# Patient Record
Sex: Female | Born: 1960 | Race: Black or African American | Hispanic: No | Marital: Single | Smoking: Never smoker
Health system: Southern US, Community
[De-identification: ages and names within clinical notes are randomized; demographics above are authoritative.]

## PROBLEM LIST (undated history)

## (undated) DIAGNOSIS — I10 Essential (primary) hypertension: Secondary | ICD-10-CM

## (undated) DIAGNOSIS — G4730 Sleep apnea, unspecified: Secondary | ICD-10-CM

## (undated) DIAGNOSIS — I872 Venous insufficiency (chronic) (peripheral): Secondary | ICD-10-CM

## (undated) HISTORY — DX: Essential (primary) hypertension: I10

## (undated) HISTORY — DX: Venous insufficiency (chronic) (peripheral): I87.2

## (undated) HISTORY — PX: OTHER SURGICAL HISTORY: SHX169

## (undated) HISTORY — DX: Sleep apnea, unspecified: G47.30

---

## 2001-05-21 ENCOUNTER — Encounter: Admission: RE | Admit: 2001-05-21 | Discharge: 2001-05-21 | Payer: Self-pay | Admitting: Family Medicine

## 2001-07-18 ENCOUNTER — Encounter: Admission: RE | Admit: 2001-07-18 | Discharge: 2001-07-18 | Payer: Self-pay | Admitting: Family Medicine

## 2001-09-18 ENCOUNTER — Encounter: Admission: RE | Admit: 2001-09-18 | Discharge: 2001-09-18 | Payer: Self-pay | Admitting: Family Medicine

## 2002-04-23 ENCOUNTER — Encounter: Payer: Self-pay | Admitting: Sports Medicine

## 2002-04-23 ENCOUNTER — Encounter: Admission: RE | Admit: 2002-04-23 | Discharge: 2002-04-23 | Payer: Self-pay | Admitting: Sports Medicine

## 2002-04-23 ENCOUNTER — Encounter: Admission: RE | Admit: 2002-04-23 | Discharge: 2002-04-23 | Payer: Self-pay | Admitting: Family Medicine

## 2002-05-07 ENCOUNTER — Encounter: Admission: RE | Admit: 2002-05-07 | Discharge: 2002-05-07 | Payer: Self-pay | Admitting: Family Medicine

## 2003-06-06 ENCOUNTER — Encounter (INDEPENDENT_AMBULATORY_CARE_PROVIDER_SITE_OTHER): Payer: Self-pay | Admitting: *Deleted

## 2003-06-06 LAB — CONVERTED CEMR LAB

## 2003-06-07 ENCOUNTER — Encounter: Admission: RE | Admit: 2003-06-07 | Discharge: 2003-06-07 | Payer: Self-pay | Admitting: Family Medicine

## 2003-06-11 ENCOUNTER — Encounter: Admission: RE | Admit: 2003-06-11 | Discharge: 2003-06-11 | Payer: Self-pay | Admitting: Family Medicine

## 2005-02-08 ENCOUNTER — Ambulatory Visit: Payer: Self-pay | Admitting: Family Medicine

## 2005-02-28 ENCOUNTER — Ambulatory Visit: Payer: Self-pay | Admitting: Family Medicine

## 2005-03-02 ENCOUNTER — Encounter: Admission: RE | Admit: 2005-03-02 | Discharge: 2005-03-02 | Payer: Self-pay | Admitting: Sports Medicine

## 2005-03-02 IMAGING — MG MM MAMMO SCREENING
4 series · 4 of 4 positions shown · non-contrast
Comparison: none

SCREENING MAMMOGRAM:
There is a fibroglandular pattern.  No masses or malignant type calcifications are identified.  
Compared with prior studies.

[R CC]
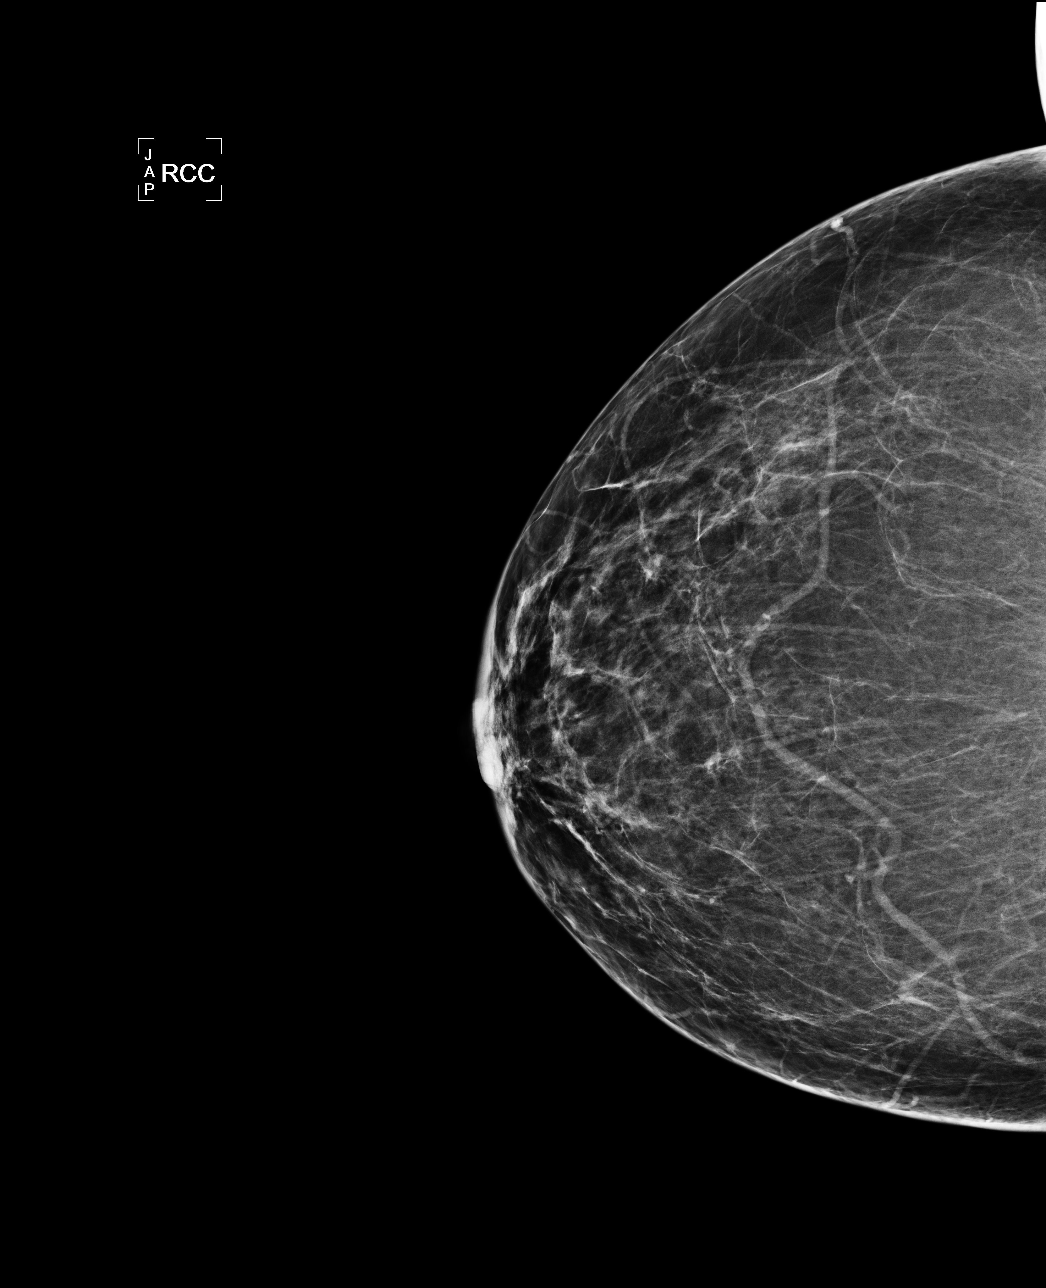

[L CC]
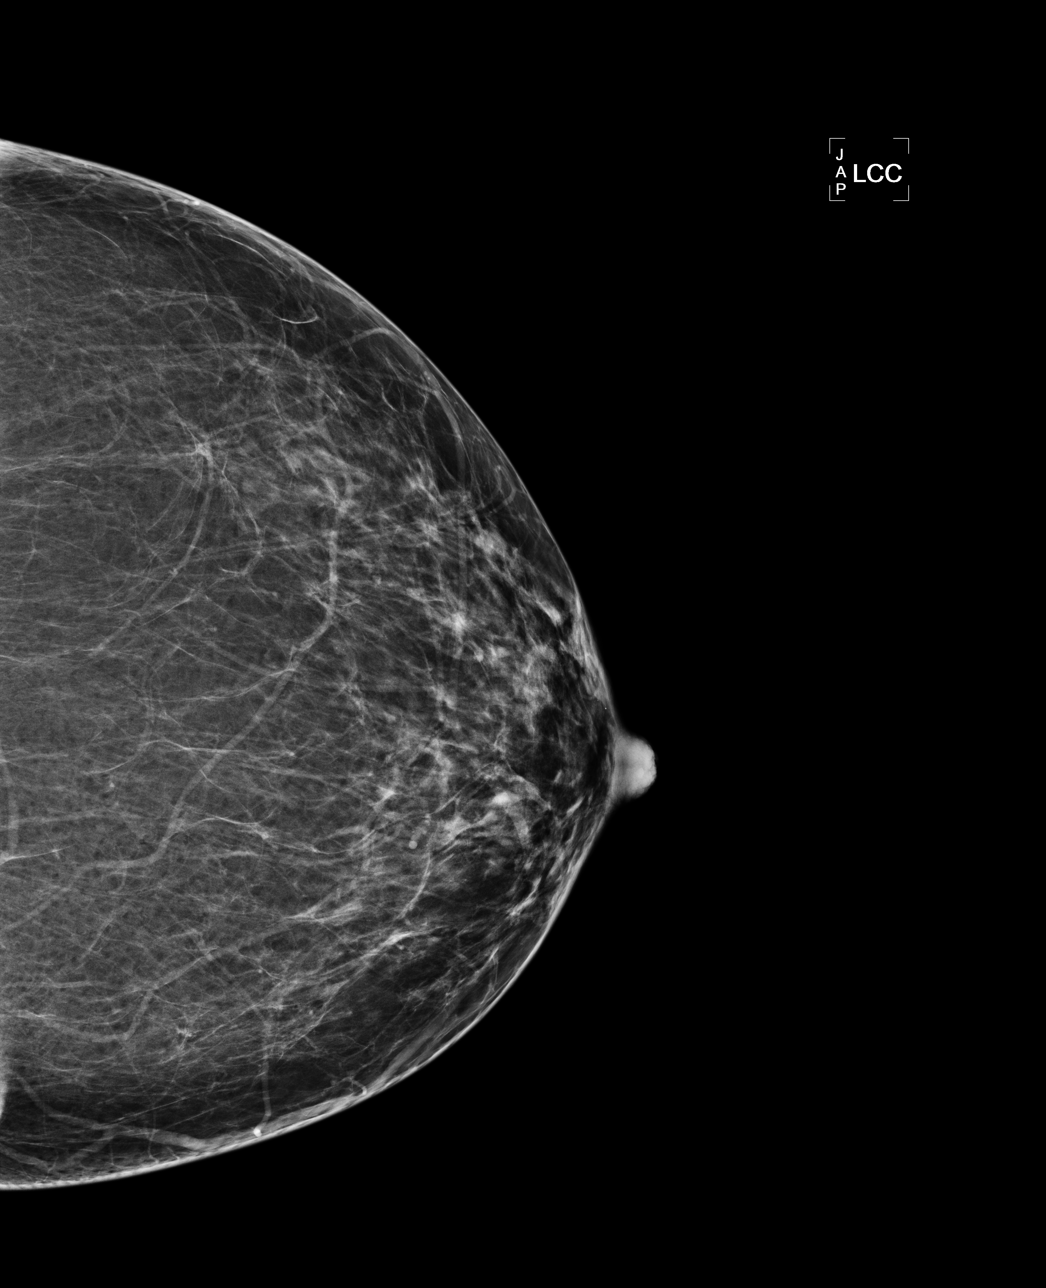

[L MLO]
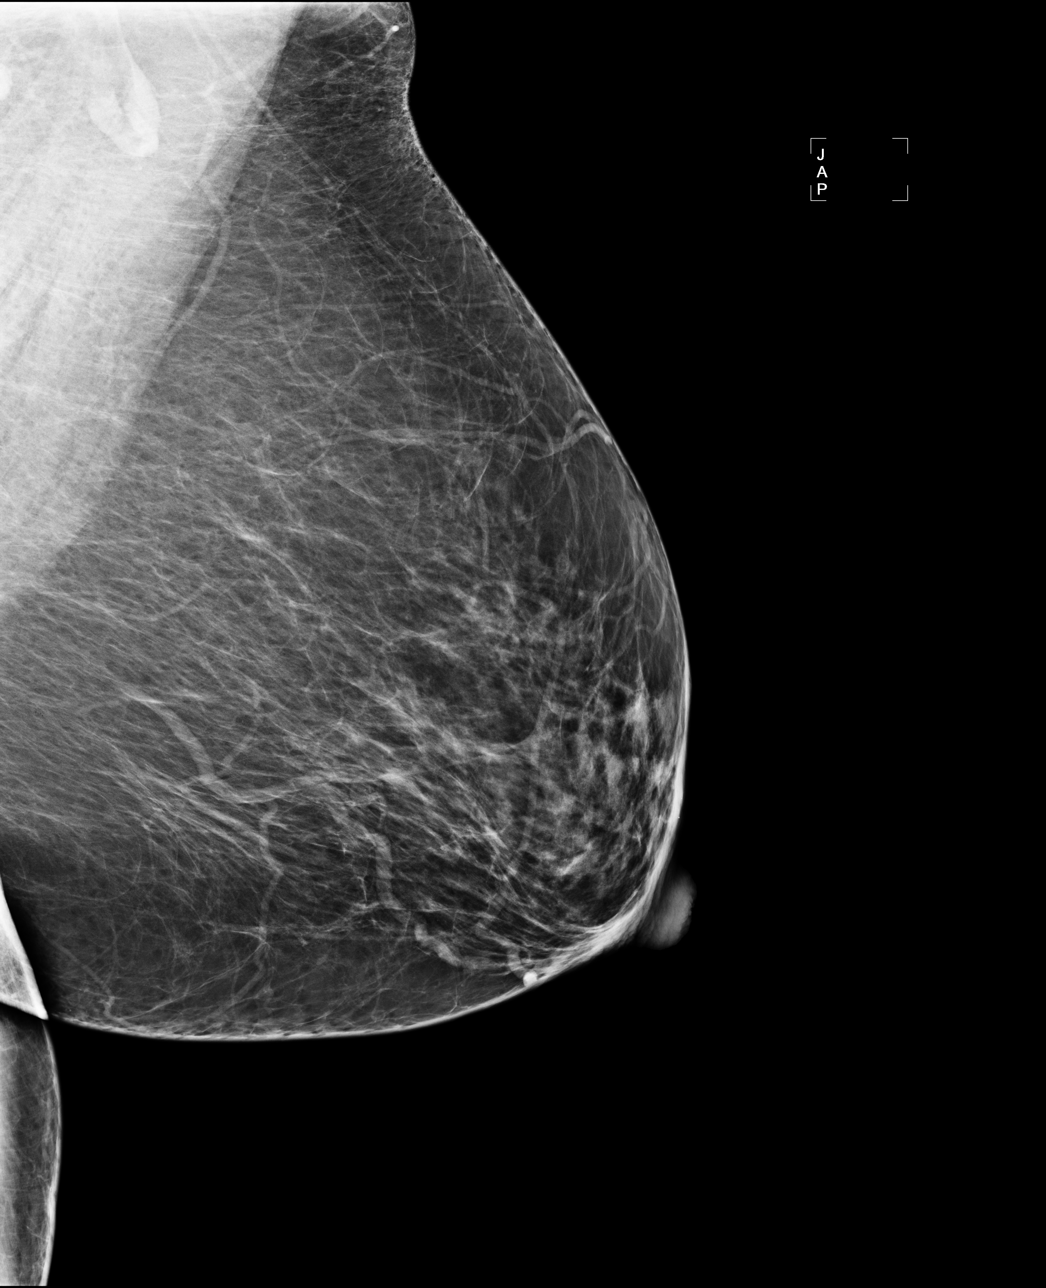

[R MLO]
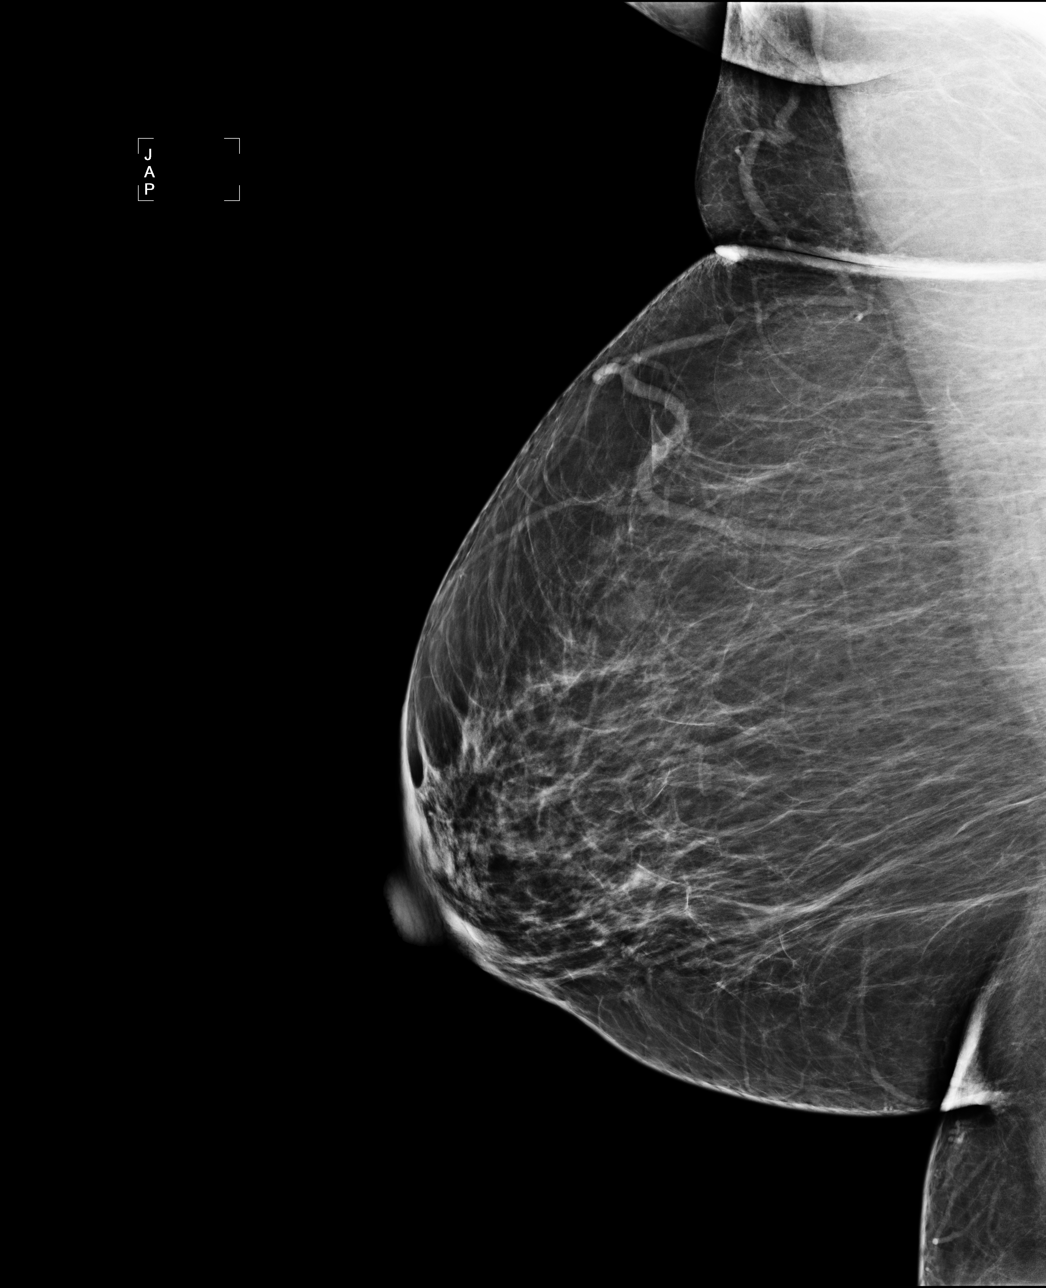

[4 of 4 positions shown; findings below may reference images not displayed]

IMPRESSION: No specific mammographic evidence of malignancy.  Next screening mammogram is recommended in one 
year.

ASSESSMENT: Negative - BI-RADS 1

Screening mammogram in 1 year.

## 2006-03-28 ENCOUNTER — Ambulatory Visit: Payer: Self-pay | Admitting: Family Medicine

## 2006-04-05 ENCOUNTER — Encounter (INDEPENDENT_AMBULATORY_CARE_PROVIDER_SITE_OTHER): Payer: Self-pay | Admitting: *Deleted

## 2008-05-12 ENCOUNTER — Telehealth: Payer: Self-pay | Admitting: Family Medicine

## 2008-05-14 ENCOUNTER — Ambulatory Visit: Payer: Self-pay | Admitting: Family Medicine

## 2008-05-14 DIAGNOSIS — I872 Venous insufficiency (chronic) (peripheral): Secondary | ICD-10-CM | POA: Insufficient documentation

## 2008-06-15 ENCOUNTER — Encounter: Payer: Self-pay | Admitting: Family Medicine

## 2008-06-15 ENCOUNTER — Ambulatory Visit: Payer: Self-pay | Admitting: Family Medicine

## 2008-06-15 DIAGNOSIS — I10 Essential (primary) hypertension: Secondary | ICD-10-CM | POA: Insufficient documentation

## 2008-06-15 LAB — CONVERTED CEMR LAB
ALT: 27 units/L (ref 0–35)
AST: 34 units/L (ref 0–37)
Albumin: 4.5 g/dL (ref 3.5–5.2)
Alkaline Phosphatase: 53 units/L (ref 39–117)
BUN: 9 mg/dL (ref 6–23)
CO2: 23 meq/L (ref 19–32)
Calcium: 9.7 mg/dL (ref 8.4–10.5)
Chloride: 104 meq/L (ref 96–112)
Creatinine, Ser: 0.86 mg/dL (ref 0.40–1.20)
Glucose, Bld: 90 mg/dL (ref 70–99)
HCT: 40.9 % (ref 36.0–46.0)
Hemoglobin: 14 g/dL (ref 12.0–15.0)
MCHC: 34.2 g/dL (ref 30.0–36.0)
MCV: 94.2 fL (ref 78.0–100.0)
Platelets: 219 10*3/uL (ref 150–400)
Potassium: 3.7 meq/L (ref 3.5–5.3)
RBC: 4.34 M/uL (ref 3.87–5.11)
RDW: 13 % (ref 11.5–15.5)
Sodium: 142 meq/L (ref 135–145)
TSH: 1.246 microintl units/mL (ref 0.350–4.500)
Total Bilirubin: 1 mg/dL (ref 0.3–1.2)
Total Protein: 7.5 g/dL (ref 6.0–8.3)
WBC: 4.7 10*3/uL (ref 4.0–10.5)

## 2008-06-19 ENCOUNTER — Encounter: Payer: Self-pay | Admitting: Family Medicine

## 2008-11-29 ENCOUNTER — Ambulatory Visit: Payer: Self-pay | Admitting: Family Medicine

## 2008-11-29 DIAGNOSIS — E663 Overweight: Secondary | ICD-10-CM | POA: Insufficient documentation

## 2009-01-06 ENCOUNTER — Ambulatory Visit: Payer: Self-pay | Admitting: Family Medicine

## 2009-01-06 ENCOUNTER — Encounter: Payer: Self-pay | Admitting: Family Medicine

## 2009-01-06 LAB — CONVERTED CEMR LAB
ALT: 17 units/L (ref 0–35)
AST: 27 units/L (ref 0–37)
Albumin: 4.2 g/dL (ref 3.5–5.2)
Alkaline Phosphatase: 43 units/L (ref 39–117)
BUN: 9 mg/dL (ref 6–23)
CO2: 24 meq/L (ref 19–32)
Calcium: 9 mg/dL (ref 8.4–10.5)
Chloride: 106 meq/L (ref 96–112)
Cholesterol: 143 mg/dL (ref 0–200)
Creatinine, Ser: 0.75 mg/dL (ref 0.40–1.20)
Glucose, Bld: 93 mg/dL (ref 70–99)
HDL: 54 mg/dL (ref >39)
LDL Cholesterol: 27 mg/dL (ref 0–99)
Potassium: 3.7 meq/L (ref 3.5–5.3)
Sodium: 143 meq/L (ref 135–145)
Total Bilirubin: 0.4 mg/dL (ref 0.3–1.2)
Total CHOL/HDL Ratio: 2.6
Total Protein: 6.5 g/dL (ref 6.0–8.3)
Triglycerides: 308 mg/dL — ABNORMAL HIGH (ref <150)
VLDL: 62 mg/dL — ABNORMAL HIGH (ref 0–40)

## 2010-01-18 ENCOUNTER — Encounter: Payer: Self-pay | Admitting: Family Medicine

## 2010-01-18 ENCOUNTER — Ambulatory Visit: Payer: Self-pay | Admitting: Family Medicine

## 2010-01-18 LAB — CONVERTED CEMR LAB
BUN: 8 mg/dL (ref 6–23)
CO2: 24 meq/L (ref 19–32)
Calcium: 9.5 mg/dL (ref 8.4–10.5)
Chloride: 103 meq/L (ref 96–112)
Creatinine, Ser: 0.79 mg/dL (ref 0.40–1.20)
Glucose, Bld: 109 mg/dL — ABNORMAL HIGH (ref 70–99)
Potassium: 3.5 meq/L (ref 3.5–5.3)
Sodium: 142 meq/L (ref 135–145)

## 2010-02-07 ENCOUNTER — Ambulatory Visit: Admission: RE | Admit: 2010-02-07 | Payer: Self-pay | Source: Home / Self Care

## 2010-02-07 ENCOUNTER — Encounter: Payer: Self-pay | Admitting: Family Medicine

## 2010-02-10 ENCOUNTER — Ambulatory Visit: Admission: RE | Admit: 2010-02-10 | Discharge: 2010-02-10 | Payer: Self-pay | Source: Home / Self Care

## 2010-03-01 ENCOUNTER — Encounter: Payer: Self-pay | Admitting: Family Medicine

## 2010-03-01 ENCOUNTER — Ambulatory Visit: Admission: RE | Admit: 2010-03-01 | Discharge: 2010-03-01 | Payer: Self-pay | Source: Home / Self Care

## 2010-03-02 ENCOUNTER — Encounter: Payer: Self-pay | Admitting: Family Medicine

## 2010-03-02 LAB — CONVERTED CEMR LAB
BUN: 11 mg/dL
CO2: 28 meq/L
Calcium: 9.7 mg/dL
Chloride: 96 meq/L
Creatinine, Ser: 0.69 mg/dL
Glucose, Bld: 96 mg/dL
Potassium: 2.9 meq/L — ABNORMAL LOW
Sodium: 139 meq/L

## 2010-03-06 ENCOUNTER — Telehealth: Payer: Self-pay | Admitting: Family Medicine

## 2010-03-06 DIAGNOSIS — E876 Hypokalemia: Secondary | ICD-10-CM | POA: Insufficient documentation

## 2010-03-09 NOTE — Miscellaneous (Signed)
Summary: late entry  triage   Clinical Lists Changes  patient came to office yesterday afternoon stating while at work she felt nauseated and vomited 4 times.  someone took her BP and told her it was very high. she comes in now for BP check. states she has been without her meds for at least 2 months. BP checked manually using regular adult cuff.  BP LA 130/90  RA 130/88.  pulse 92. no further vomiting but she does feel nauseated.  advised to start sipping on clear liquids slowly this evening then gradually push fluids. bland diet, appointment scheduled for 01/18/2010 with Dr. Janalyn Harder for follow up and refill meds. Theresia Lo RN  January 18, 2010 9:05 AM

## 2010-03-09 NOTE — Letter (Signed)
Summary: Generic Letter  [facility]  [address]   [place], [place] [address]   Phone: [phone]  Fax: [phone]    03/02/2010  [facility] [address] [place], [place]  [address]  Dear Ms. Cohron,  I tried calling you regarding your lab result but your phone was disconnected.  Your lab showed a  low potassium (2.9).   This is likely due to the increased dose of Hydrochlorothiazide that you are taking and the Furosemide.  I have sent a prescription for KCl daily x 1 week to your pharmacy.  You will take this medicine every day for one week.  Then we will repeat lab.  We may need to make changes to your medication if potassium continues to be low.  We may stop the  Lasix and replace it  with spironolactone.  Please make an appointment to see me in the clinic in one week.  Please call if you have any questions or concerns.    Sincerely,   Rory Xiang MD  Appended Document: Generic Letter mailed

## 2010-03-09 NOTE — Assessment & Plan Note (Signed)
Summary: F/U  BP/KH   Vital Signs:  Patient profile:   50 year old female Height:      65.5 inches Weight:      155 pounds BMI:     25.49 Temp:     98.1 degrees F Pulse rate:   96 / minute BP sitting:   120 / 83  (left arm) Cuff size:   regular  Vitals Entered By: Golden Circle RN (March 01, 2010 9:02 AM) CC: f/u bp & check lesions R leg Pain Assessment Patient in pain? no       Does patient need assistance? Functional Status Self care Ambulation Normal   Primary Care Provider:  Moira Umholtz MD  CC:  f/u bp & check lesions R leg.  History of Present Illness: 50 y/o F here for HTN f/u as we increased HCTZ from 12.5 mg to 25mg  at last visit.   HYPERTENSION Disease Monitoring Blood pressure range: 140s/90s Medications: hctz 25mg , lasix 20  Compliance: yes, states she takes all meds daily.  Compliance prob in the past, but states that she has not missed any doses  Lightheadedness: no  Edema:no  Chest pain: no  Dyspnea:no  Syncope:no  Palpitations: no Prevention Exercise: yes, walking at least 3-4 times per week, for 30 minutes each time   Salt restriction:trying Smoking: quit  Weight: -3.6 lbs   Habits & Providers  Alcohol-Tobacco-Diet     Alcohol drinks/day: <1     Alcohol type: beer     Tobacco Status: never  Exercise-Depression-Behavior     Does Patient Exercise: yes     Type of exercise: walk     Times/week: 6     Drug Use: never     Seat Belt Use: always  Current Medications (verified): 1)  Furosemide 20 Mg Tabs (Furosemide) .Marland Kitchen.. 1 Tab By Mouth Daily 2)  Hydrochlorothiazide 25 Mg Tabs (Hydrochlorothiazide) .Marland Kitchen.. 1 Tab By Mouth Every Day For Blood Pressure 3)  Aspir-Low 81 Mg Tbec (Aspirin) .Marland Kitchen.. 1 Tab By Mouth Daiy  Allergies (verified): No Known Drug Allergies  Social History: Does Patient Exercise:  yes Drug Use:  never Seat Belt Use:  always  Review of Systems       per hpi   Physical Exam  General:  Well-developed,well-nourished,in no  acute distress; alert,appropriate and cooperative throughout examination. Vitals reviewed.    Impression & Recommendations:  Problem # 1:  ESSENTIAL HYPERTENSION, BENIGN (ICD-401.1) Assessment Improved BP 120/83 is improved and at goal.  Will continue HCTZ 25mg , Lasix 20mg .  Will check Bmet today.  Pt to rtc in 2 months for f/u.     Her updated medication list for this problem includes:    Furosemide 20 Mg Tabs (Furosemide) .Marland Kitchen... 1 tab by mouth daily    Hydrochlorothiazide 25 Mg Tabs (Hydrochlorothiazide) .Marland Kitchen... 1 tab by mouth every day for blood pressure  Orders: Basic Met-[facility] ([address]20-25427) [facility]- Est Level  3 ([address]237)  Complete Medication List: 1)  Furosemide 20 Mg Tabs (Furosemide) .Marland Kitchen.. 1 tab by mouth daily 2)  Hydrochlorothiazide 25 Mg Tabs (Hydrochlorothiazide) .Marland Kitchen.. 1 tab by mouth every day for blood pressure 3)  Aspir-low 81 Mg Tbec (Aspirin) .Marland Kitchen.. 1 tab by mouth daiy 4)  Hydrocortisone 0.5 % Oint (Hydrocortisone) .... Apply two times a day to affected areas on legs. dispense 30 grams.  Other Orders: Mammogram (Screening) (Mammo)  Patient Instructions: 1)  Please schedule a follow-up appointment in 2 months for HTN.  2)  Your blood pressure looks great today.  3)  Continue all your medications as directed. 4)  Call your pharmacy for refills when you are done with your medications.  5)  Continue to exercise.  6)  Schedule your mammogram.  Prescriptions: HYDROCORTISONE 0.5 % OINT (HYDROCORTISONE) Apply two times a day to affected areas on legs. Dispense 30 grams.  #1 x 3   Entered and Authorized by:   Angeline Slim MD   Signed by:   Angeline Slim MD on 03/01/2010   Method used:   [facility] [address] [phone]* (retail)       [address]       [place], [place]  [address]       Ph: [phone] or [phone]       Fax: [phone]   RxID:   [id number]    Orders Added: 1)  Mammogram (Screening) [Mammo] 2)  Basic Met-[facility]  [36644-03474] 3)  [facility]- Est Level  3 [25956]     Prevention & Chronic Care Immunizations   Influenza vaccine: Fluvax 3+  (01/18/2010)   Influenza vaccine due: 11/29/2009    Tetanus booster: 06/06/2003: Done.   Tetanus booster due: 06/05/2013    Pneumococcal vaccine: Not documented  Other Screening   Pap smear: NEGATIVE FOR INTRAEPITHELIAL LESIONS OR MALIGNANCY.  (06/15/2008)   Pap smear due: 06/15/2009    Mammogram: Done.  (03/14/2005)   Mammogram action/deferral: Ordered  (03/01/2010)   Mammogram due: 03/14/2006   Smoking status: never  (03/01/2010)  Lipids   Total Cholesterol: 143  (01/06/2009)   LDL: 27  (01/06/2009)   LDL Direct: Not documented   HDL: 54  (01/06/2009)   Triglycerides: 308  (01/06/2009)  Hypertension   Last Blood Pressure: 120 / 83  (03/01/2010)   Serum creatinine: 0.79  (01/18/2010)   Serum potassium 3.5  (01/18/2010)    Hypertension flowsheet reviewed?: Yes   Progress toward BP goal: At goal  Self-Management Support :   Personal Goals (by the next clinic visit) :      Personal blood pressure goal: 130/80  (11/29/2008)   Hypertension self-management support: Written self-care plan, Resources for patients handout  (01/06/2009)   Nursing Instructions: Schedule screening mammogram (see order)

## 2010-03-09 NOTE — Assessment & Plan Note (Signed)
Summary: out of BP meds   Vital Signs:  Patient profile:   50 year old female Height:      65.5 inches Weight:      164.06 pounds BMI:     26.98 Temp:     98.2 degrees F oral Pulse rate:   85 / minute BP sitting:   146 / 102  (right arm)  Vitals Entered By: Angeline Slim MD (January 18, 2010 9:49 AM) CC: Out of BP medication Is Patient Diabetic? No Pain Assessment Patient in pain? no        Primary Care Provider:  Cat Ta MD  CC:  Out of BP medication.  History of Present Illness: 50 y/o F with HTN is here for elevated BP.    Pt was at work yesterday when she became nauseous.  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  The nurse checked her BP and it was very high and so she came here yesterday for BP check and it was in the 130s/90s.  Pt has been out of BP meds x at least 6 months.    HYPERTENSION Disease Monitoring Blood pressure range: 130s-160s Medications: Lasix 20mg  daily, HCTZ 12.5mg  daily Compliance: NO, has been off of meds for at least 6 months. Lightheadedness:no   Edema:no  Chest pain:no  Dyspnea:no   Prevention Exercise: walking everyday about 1hr    Salt restriction:trying, states "it's very hard" Tobacco: none Etoh: 1-2 beers each day Drugs: none Weight: -2 lbs since last visit.    Habits & Providers  Alcohol-Tobacco-Diet     Tobacco Status: never  Current Medications (verified): 1)  None  Allergies (verified): No Known Drug Allergies  Past History:  Past Surgical History: Last updated: 04/04/2006 BTL - 02/06/1996, C-section - 02/06/1996  Family History: Last updated: 06/15/2008 1 sister--recurrent sinus infections, allergies,  Father died in his late 23`s, unknown cause,  Mom died with Type 1 DM, age 19, HTN No breast CA, gyn CA  Social History: Last updated: 01/18/2010 Lives with 3 daughters in [place] apt.; No tobacco or illicit drug history; Drinks 1-2 beers daily  Employment: [employer] @ [employer] since   1997;  Sporadic exercise (walking)  Risk Factors: Smoking Status: never (01/18/2010)  Past Medical History: H/o trichomonas (6/03)--treated, NSVD x 2, Varicose veins bilateral legs [id number]: first delivery was C/S, rest were VBAC HTN Noncompliance  Social History: Lives with 3 daughters in [place] apt.; No tobacco or illicit drug history; Drinks 1-2 beers daily  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 Vitals reviewed.  Lungs:  Normal respiratory effort, chest expands symmetrically. Lungs are clear to auscultation, no crackles or wheezes. Heart:  Normal rate and regular rhythm. S1 and S2 normal without gallop, murmur, click, rub or other extra sounds. Pulses:  +2 bilaterally  Extremities:  trace edema b/l    Impression & Recommendations:  Problem # 1:  ESSENTIAL HYPERTENSION, BENIGN (ICD-401.1) Assessment Deteriorated Pt has been out of meds for months.  She has been noncompliant in the past.  I am not sure the reason behind this.  Pt denies that cost is an issue.  Pt states, "I have insurance and the medicines are not expensive."  I will resume previous meds of Lasix 20mg  and HCTZ  12.5mg  as they worked to get her to goal of <130/80 when she took the meds.  I am resuming Lasix as pt has elevated DBP.  Given her noncompliance with HTN I am concerned for future LVH.  Will check CBC and Bmet today.  Pt to rtc in 2 wks for f/u.    Her updated medication list for this problem includes:    Furosemide 20 Mg Tabs (Furosemide) .Marland Kitchen... 1 tab by mouth daily    Hydrochlorothiazide 12.5 Mg Caps (Hydrochlorothiazide) ..... One tablet by mouth daily  Orders: Basic Met-[facility] ([address]130-86578) [facility]- Est Level  3 (46962)  Complete Medication List: 1)  Furosemide 20 Mg Tabs  (Furosemide) .Marland Kitchen.. 1 tab by mouth daily 2)  Hydrochlorothiazide 12.5 Mg Caps (Hydrochlorothiazide) .... One tablet by mouth daily  Other Orders: Flu Vaccine 55yrs + ([address]284) Admin 1st Vaccine ([address]244)  Patient Instructions: 1)  Please schedule a follow-up appointment in 2 weeks for blood pressure check. 2)  Take your blood pressure medicine everyday: 3)  HCTZ 12.5mg  4)  Furosemide 20mg   Prescriptions: FUROSEMIDE 20 MG TABS (FUROSEMIDE) 1 tab by mouth daily  #30 x 0   Entered and Authorized by:   Angeline Slim MD   Signed by:   Angeline Slim MD on 01/18/2010   Method used:   Electronically to        [facility] (retail)       [address]       [place], [place]  [id number]       Ph: [phone]       Fax: [phone]   RxID:   [id number] HYDROCHLOROTHIAZIDE 12.5 MG CAPS (HYDROCHLOROTHIAZIDE) one tablet by mouth daily  #30 x 0   Entered and Authorized by:   Angeline Slim MD   Signed by:   Angeline Slim MD on 01/18/2010   Method used:   Electronically to        [facility] (retail)       [address]       [place], [place]  [address]       Ph: [phone]       Fax: [phone]   RxID:   [id number] HYDROCHLOROTHIAZIDE 12.5 MG CAPS (HYDROCHLOROTHIAZIDE) one tablet by mouth daily  #30 x 0   Entered and Authorized by:   Angeline Slim MD   Signed by:   Angeline Slim MD on 01/18/2010   Method used:   Electronically to        [facility] (retail)       [address]       [place], [place]  [id number]       Ph: [phone]       Fax: [phone]   RxID:   [id number]    Orders Added: 1)  Basic Met-[facility] [78242-[address]361] 2)  [facility]- Est Level  3 [[address]315] 3)  Flu Vaccine 32yrs + [40086] 4)  Admin 1st Vaccine [76195]   Immunizations Administered:  Influenza Vaccine # 1:    Vaccine Type: Fluvax 3+    Site: right deltoid    Mfr: GlaxoSmithKline    Dose: 0.5 ml    Route: IM    Given by: Tessie Fass CMA    Exp. Date: 08/05/2010     Lot #: [id number]    VIS given: 08/30/09 version given January 18, 2010.  Flu Vaccine Consent Questions:    Do you have a history of severe allergic reactions to this vaccine?  no    Any prior history of allergic reactions to egg and/or gelatin? no    Do you have a sensitivity to the preservative Thimersol? no    Do you have a past history of Guillan-Barre Syndrome? no    Do you currently have an acute febrile illness? no    Have you ever had a severe reaction to latex? no    Vaccine information given and explained to patient? yes    Are you currently pregnant? no   Immunizations Administered:  Influenza Vaccine # 1:    Vaccine Type: Fluvax 3+    Site: right deltoid    Mfr: GlaxoSmithKline    Dose: 0.5 ml    Route: IM    Given by: Tessie Fass CMA    Exp. Date: 08/05/2010    Lot #: [id number]    VIS given: 08/30/09 version given January 18, 2010.    Prevention & Chronic Care Immunizations   Influenza vaccine: Fluvax 3+  (01/18/2010)   Influenza vaccine due: 11/29/2009    Tetanus booster: 06/06/2003: Done.   Tetanus booster due: 06/05/2013    Pneumococcal vaccine: Not documented  Other Screening   Pap smear: NEGATIVE FOR INTRAEPITHELIAL LESIONS OR MALIGNANCY.  (06/15/2008)   Pap smear due: 06/15/2009    Mammogram: Done.  (03/14/2005)   Mammogram action/deferral: Ordered  (11/29/2008)   Mammogram due: 03/14/2006   Smoking status: never  (01/18/2010)  Lipids   Total Cholesterol: 143  (01/06/2009)   LDL: 27  (01/06/2009)   LDL Direct: Not documented   HDL: 54  (01/06/2009)   Triglycerides: 308  (01/06/2009)  Hypertension   Last Blood Pressure: 146 / 102  (01/18/2010)   Serum creatinine: 0.75  (01/06/2009)   Serum potassium 3.7  (01/06/2009)    Hypertension flowsheet reviewed?: Yes   Progress toward BP goal: Deteriorated  Self-Management Support :   Personal Goals (by the next clinic visit) :      Personal blood pressure goal: 130/80  (11/29/2008)    Hypertension self-management support: Written self-care plan, Resources for patients handout  (01/06/2009)

## 2010-03-09 NOTE — Letter (Signed)
Summary: Out of Work  [facility]  [address]   [place], [place] [address]   Phone: [phone]  Fax: [phone]    January 18, 2010   Employee:  Kiesha D Topping    To Whom It May Concern:   For Medical reasons, please excuse the above named employee from work for the following dates:  Start:   Jan 18, 2010  End:   May return to work on Jan 19, 2010  If you need additional information, please feel free to contact our office.         Sincerely,    Cat Ta MD

## 2010-03-09 NOTE — Assessment & Plan Note (Signed)
Summary: F/U  HTN   Vital Signs:  Patient profile:   50 year old female Weight:      158.6 pounds Pulse rate:   90 / minute BP sitting:   130 / 90  (right arm) Cuff size:   regular  Vitals Entered By: Arlyss Repress CMA, (February 10, 2010 9:00 AM) CC: f/up HTN Is Patient Diabetic? No Pain Assessment Patient in pain? no        Primary Care Provider:  Keyvon Herter MD  CC:  f/up HTN.  History of Present Illness: 50 y/o F here for HTN f/u  HYPERTENSION Disease Monitoring Blood pressure range:  Medications: hctz 12.5, lasix 20  Compliance: yes, states she takes all meds daily.  Compliance prob in the pas Lightheadedness: no  Edema:no  Chest pain: no  Dyspnea:no   Prevention Exercise: no  Salt restriction:trying Smoking: quit    Habits & Providers  Alcohol-Tobacco-Diet     Tobacco Status: never  Current Medications (verified): 1)  Furosemide 20 Mg Tabs (Furosemide) .Marland Kitchen.. 1 Tab By Mouth Daily 2)  Hydrochlorothiazide 12.5 Mg Caps (Hydrochlorothiazide) .... One Tablet By Mouth Daily  Allergies (verified): No Known Drug Allergies  Review of Systems       per hpi   Physical Exam  General:  Well-developed,well-nourished,in no acute distress; alert,appropriate and cooperative throughout examination. vitals reviewed Lungs:  Normal respiratory effort, chest expands symmetrically. Lungs are clear to auscultation, no crackles or wheezes. Heart:  Normal rate and regular rhythm. S1 and S2 normal without gallop, murmur, click, rub or other extra sounds. Pulses:  +2 publes b/l  Extremities:  no edema   Impression & Recommendations:  Problem # 1:  ESSENTIAL HYPERTENSION, BENIGN (ICD-401.1) Assessment Improved  BP today 130/90, improved from last visit.  DBP still elevated.  Will change HCTZ 12.5mg  to 25mg  daily.  Cont Lasix 20mg  daily.  Pt to rtc in 2 wks for f/u.  Her updated medication list for this problem includes:    Furosemide 20 Mg Tabs (Furosemide) .Marland Kitchen... 1 tab by  mouth daily    Hydrochlorothiazide 25 Mg Tabs (Hydrochlorothiazide) .Marland Kitchen... 1 tab by mouth every day for blood pressure  Orders: [facility]- Est Level  3 ([address])  Complete Medication List: 1)  Furosemide 20 Mg Tabs (Furosemide) .Marland Kitchen.. 1 tab by mouth daily 2)  Hydrochlorothiazide 25 Mg Tabs (Hydrochlorothiazide) .Marland Kitchen.. 1 tab by mouth every day for blood pressure 3)  Aspir-low 81 Mg Tbec (Aspirin) .Marland Kitchen.. 1 tab by mouth daiy  Patient Instructions: 1)  Please schedule a follow-up appointment in 2 weeks for blood pressure. 2)  Hydrochlorothiazide 12.5mg : take 2 pills every day. 3)  Furosemide 20mg : take 1 pill every day 4)  Aspirin 81mg : take 1 every day 5)    Prescriptions: HYDROCHLOROTHIAZIDE 25 MG TABS (HYDROCHLOROTHIAZIDE) 1 tab by mouth every day for blood pressure  #30 x 1   Entered and Authorized by:   Angeline Slim MD   Signed by:   Angeline Slim MD on 02/10/2010   Method used:   [facility] [address] [phone]* (retail)       [address]       [place], [place]  [address]       Ph: [phone] or [phone]       Fax: [phone]   RxID:   [id number]    Orders Added: 1)  [facility]- Est Level  3 [  [address]] 

## 2010-03-09 NOTE — Assessment & Plan Note (Signed)
Summary: pt arrived 1 hr late/r/s.TS   Allergies: No Known Drug Allergies   Complete Medication List: 1)  Furosemide 20 Mg Tabs (Furosemide) .Marland Kitchen.. 1 tab by mouth daily 2)  Hydrochlorothiazide 12.5 Mg Caps (Hydrochlorothiazide) .... One tablet by mouth daily  Other Orders: No Charge Patient Arrived (NCPA0) (NCPA0)   Orders Added: 1)  No Charge Patient Arrived (NCPA0) [NCPA0]

## 2010-03-10 ENCOUNTER — Encounter: Payer: Self-pay | Admitting: Family Medicine

## 2010-03-10 ENCOUNTER — Other Ambulatory Visit (INDEPENDENT_AMBULATORY_CARE_PROVIDER_SITE_OTHER): Payer: BC Managed Care – PPO

## 2010-03-10 ENCOUNTER — Telehealth: Payer: Self-pay | Admitting: Family Medicine

## 2010-03-10 DIAGNOSIS — E876 Hypokalemia: Secondary | ICD-10-CM

## 2010-03-13 LAB — CONVERTED CEMR LAB
CO2: 31 meq/L (ref 19–32)
Calcium: 9.7 mg/dL (ref 8.4–10.5)
Chloride: 90 meq/L — ABNORMAL LOW (ref 96–112)
Creatinine, Ser: 0.54 mg/dL (ref 0.40–1.20)
Glucose, Bld: 101 mg/dL — ABNORMAL HIGH (ref 70–99)
Potassium: 2.5 meq/L — CL (ref 3.5–5.3)
Sodium: 135 meq/L (ref 135–145)

## 2010-03-15 NOTE — Progress Notes (Signed)
Summary: Appt?  Phone Note Call from Patient Call back at Home Phone [phone]   Summary of Call: pt received letter stating MD wants her to be seen in 1 week, there are no opening in one week, please advise Initial call taken by: Knox Royalty,  March 06, 2010 10:45 AM  Follow-up for Phone Call        Pt can come in for lab, so I can check her potassium, then schedule as soon as appt is possible.  Please inform pt of this.  Thank you.  Follow-up by: Angeline Slim MD,  March 06, 2010 4:18 PM  Additional Follow-up for Phone Call Additional follow up Details #1::        scheduled pt for labs on 2/3 & to see MD on 2/21.  Additional Follow-up by: Knox Royalty,  March 07, 2010 11:30 AM  New Problems: HYPOKALEMIA (ICD-276.8)   New Problems: HYPOKALEMIA (ICD-276.8)

## 2010-03-15 NOTE — Progress Notes (Addendum)
  Phone Note Other Incoming   Caller: [facility] Reason for Call: Discuss lab or test results Summary of Call: was called for critical lab value of potassium 2.5. I attempted to call the patient at home twice to advise her to increase her potassium supplementation. I will forward this to her PCP.  Initial call taken by: Edd Arbour,  March 10, 2010 11:32 PM

## 2010-03-28 ENCOUNTER — Ambulatory Visit: Payer: Self-pay | Admitting: Family Medicine

## 2010-04-11 ENCOUNTER — Ambulatory Visit (INDEPENDENT_AMBULATORY_CARE_PROVIDER_SITE_OTHER): Payer: BC Managed Care – PPO | Admitting: Family Medicine

## 2010-04-11 ENCOUNTER — Encounter: Payer: Self-pay | Admitting: Family Medicine

## 2010-04-11 VITALS — BP 132/86 | HR 84 | Temp 98.1°F | Ht 65.5 in | Wt 163.0 lb

## 2010-04-11 DIAGNOSIS — I10 Essential (primary) hypertension: Secondary | ICD-10-CM

## 2010-04-11 DIAGNOSIS — E876 Hypokalemia: Secondary | ICD-10-CM

## 2010-04-11 DIAGNOSIS — R21 Rash and other nonspecific skin eruption: Secondary | ICD-10-CM | POA: Insufficient documentation

## 2010-04-11 LAB — BASIC METABOLIC PANEL
BUN: 9 mg/dL (ref 6–23)
CO2: 23 mEq/L (ref 19–32)
Chloride: 104 mEq/L (ref 96–112)
Creat: 0.7 mg/dL (ref 0.40–1.20)
Potassium: 3.7 mEq/L (ref 3.5–5.3)

## 2010-04-11 LAB — CONVERTED CEMR LAB
BUN: 9 mg/dL (ref 6–23)
Chloride: 104 meq/L (ref 96–112)
Creatinine, Ser: 0.7 mg/dL (ref 0.40–1.20)
Glucose, Bld: 89 mg/dL (ref 70–99)
Potassium: 3.7 meq/L (ref 3.5–5.3)

## 2010-04-11 MED ORDER — TRIAMCINOLONE ACETONIDE 0.025 % EX OINT: TOPICAL_OINTMENT | Freq: Two times a day (BID) | CUTANEOUS | Status: DC

## 2010-04-11 MED ORDER — HYDROCHLOROTHIAZIDE 25 MG PO TABS: 25.0000 mg | ORAL_TABLET | Freq: Every day | ORAL | Status: DC

## 2010-04-11 MED ORDER — FUROSEMIDE 20 MG PO TABS: 20.0000 mg | ORAL_TABLET | Freq: Every day | ORAL | Status: DC

## 2010-04-11 NOTE — Assessment & Plan Note (Signed)
Rash of unclear etiology.  Pt does have history of venous insufficiency but I am not sure if that is the etiology of her rash.  Will treat with symptomatically with triamcinolone at this time.  Pt to rtc in 2 wks if not improved.

## 2010-04-11 NOTE — Assessment & Plan Note (Signed)
BP 132/86, still at goal but elevated compared to last visit 2 months ago.  Pt has gained 5-6 lbs since last visit, which may contribute to elevated BP. We discussed continuing with HCTZ 25 and Lasix 20 daily and pt will try to decrease snacking.  She is still walking everyday, which is great for weight loss, but with combo of walking and careful meals/decrease snacking her weight and BP should improve.  Pt to rtc in 1 month for BP f/u.

## 2010-04-11 NOTE — Patient Instructions (Signed)
Please make appointment for 2 wks if rash is not better. Please make appointment for 4 wks for blood pressure.  I will call you with lab result.  If your potassium is still low, [facility] [address]   Try to snack less often during work.   Continue your exercise daily.

## 2010-04-11 NOTE — Assessment & Plan Note (Signed)
Last K was 2.5 and I left message for pt to continue taking KCl supplement.  I am not sure if she received this message.  We will check K today.  I will call in another Rx of KCl of needed.  Pt agreed to this plan.

## 2010-04-11 NOTE — Progress Notes (Signed)
  Subjective:    Patient ID: Karen Robertson, female    DOB: [birth year], 50 y.o.   MRN: [id number]  HPI HYPERTENSION Disease Monitoring Blood pressure range: 130s/80s Medications: hctz 25mg , lasix 20  Compliance: yes, states she takes all meds daily.  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  She admits to being less careful with intake.  She is snacking.  Smoking: quit  Weight: +5 lbs  SKIN rash:  Pruritic rash on legs x 1 month.  No rash on rest of body. She has been scratching at it.  She denies any history of rash in the past.  No new lotion/cream/soap/detergent/meds.  Denies recent travel or camping.  She denies fever, chills, cough, rhinorrhea, nuchal rigidity.   Review of Systems    per hpi   Objective:   Physical Exam  General:  Well-developed,well-nourished,in no acute distress; alert,appropriate and cooperative throughout examination. vitals reviewed Lungs:  Normal respiratory effort, chest expands symmetrically. Lungs are clear to auscultation, no crackles or wheezes. Heart:  Normal rate and regular rhythm. S1 and S2 normal without gallop, murmur, click, rub or other extra sounds. Pulses:  +2 publes b/l  Extremities:  no edema Skin: legs with rash that varies in size, irregularly shaped        Assessment & Plan:

## 2010-04-12 ENCOUNTER — Telehealth: Payer: Self-pay | Admitting: Family Medicine

## 2010-04-12 ENCOUNTER — Encounter: Payer: Self-pay | Admitting: *Deleted

## 2010-04-12 NOTE — Telephone Encounter (Signed)
Call pt re lab result: 1) kidney: ok 2) K: normal 3) advised eating fruits daily, try to stay away from snacking on job. 4) f/u in 1 month.

## 2010-04-12 NOTE — Telephone Encounter (Signed)
Called pt about labs: 1) UA: no blood 2) Kidney: ok 3) Ca: a little high, but back to baseline 4) K: 3.3 is a little low, advised eating fruits every day. She agreed. 5) Pt to see me in one month.  She agreed.

## 2010-05-04 ENCOUNTER — Other Ambulatory Visit: Payer: Self-pay | Admitting: Family Medicine

## 2010-05-04 DIAGNOSIS — Z1231 Encounter for screening mammogram for malignant neoplasm of breast: Secondary | ICD-10-CM

## 2010-05-11 ENCOUNTER — Ambulatory Visit: Payer: BC Managed Care – PPO | Admitting: Family Medicine

## 2010-05-12 ENCOUNTER — Ambulatory Visit: Payer: BC Managed Care – PPO

## 2010-05-16 ENCOUNTER — Ambulatory Visit: Payer: BC Managed Care – PPO | Admitting: Family Medicine

## 2010-05-19 ENCOUNTER — Ambulatory Visit: Payer: BC Managed Care – PPO

## 2010-06-15 ENCOUNTER — Ambulatory Visit: Payer: BC Managed Care – PPO | Admitting: Family Medicine

## 2010-06-16 ENCOUNTER — Ambulatory Visit: Payer: BC Managed Care – PPO

## 2010-06-28 ENCOUNTER — Other Ambulatory Visit: Payer: Self-pay | Admitting: Family Medicine

## 2010-06-28 NOTE — Telephone Encounter (Signed)
Refill request

## 2010-08-01 ENCOUNTER — Ambulatory Visit: Payer: BC Managed Care – PPO

## 2010-08-07 ENCOUNTER — Ambulatory Visit: Payer: BC Managed Care – PPO

## 2012-02-07 ENCOUNTER — Ambulatory Visit (INDEPENDENT_AMBULATORY_CARE_PROVIDER_SITE_OTHER): Payer: BC Managed Care – PPO | Admitting: Family Medicine

## 2012-02-07 ENCOUNTER — Telehealth: Payer: Self-pay | Admitting: Family Medicine

## 2012-02-07 ENCOUNTER — Ambulatory Visit (HOSPITAL_COMMUNITY)
Admission: RE | Admit: 2012-02-07 | Discharge: 2012-02-07 | Disposition: A | Discharge status: Home / Self Care | Payer: BC Managed Care – PPO | Source: Ambulatory Visit | Attending: Family Medicine | Admitting: Family Medicine

## 2012-02-07 ENCOUNTER — Encounter (HOSPITAL_COMMUNITY): Payer: Self-pay

## 2012-02-07 VITALS — BP 152/98 | HR 123 | Temp 98.5°F | Ht 65.5 in | Wt 168.0 lb

## 2012-02-07 DIAGNOSIS — K047 Periapical abscess without sinus: Secondary | ICD-10-CM | POA: Insufficient documentation

## 2012-02-07 DIAGNOSIS — R1310 Dysphagia, unspecified: Secondary | ICD-10-CM | POA: Insufficient documentation

## 2012-02-07 DIAGNOSIS — R220 Localized swelling, mass and lump, head: Secondary | ICD-10-CM

## 2012-02-07 DIAGNOSIS — R221 Localized swelling, mass and lump, neck: Secondary | ICD-10-CM

## 2012-02-07 IMAGING — CT CT MAXILLOFACIAL W/ CM
3 series · 16 of 47 positions shown, 19 images · IV contrast (omnipaque)
Comparison: None.

CLINICAL DATA: Right facial swelling.  Difficulty swallowing.

CT MAXILLOFACIAL WITH CONTRAST
TECHNIQUE: Multidetector CT imaging of the maxillofacial
structures was performed with intravenous contrast. Multiplanar CT
image reconstructions were also generated.
Contrast: 75mL OMNIPAQUE IOHEXOL 300 MG/ML  SOLN

[Series 4: orbit/facial 2.0 h30s · axial · 0.31mm/px · z∈[+16,+164]mm · 10 of 86 slices shown, 13 images]
[im 6/86  brain]
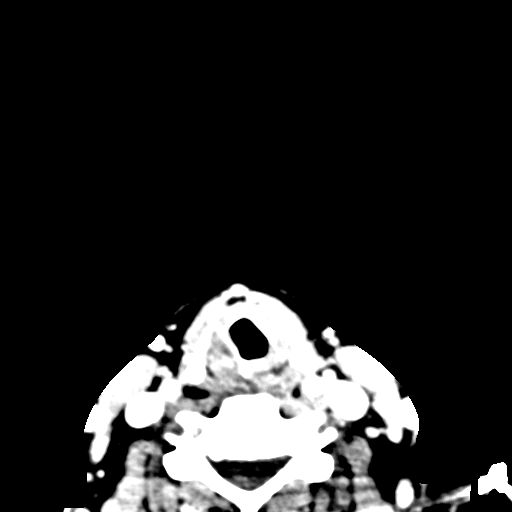
[im 6/86  bone]
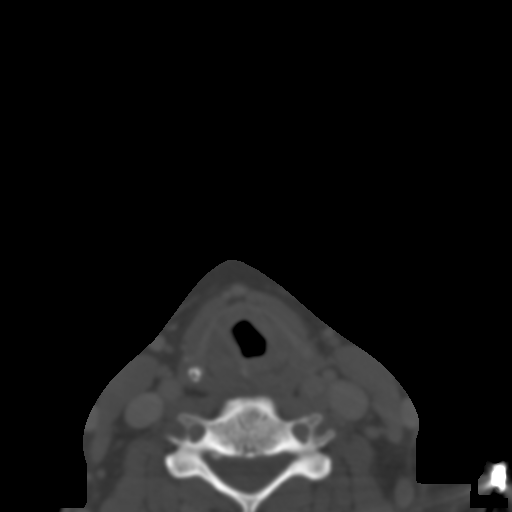
[im 15/86  bone]
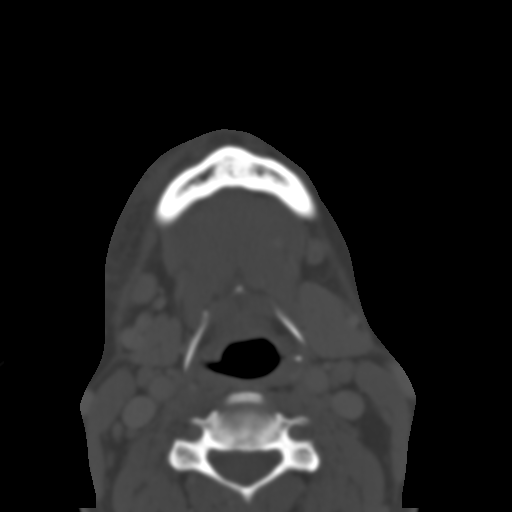
[im 24/86  bone]
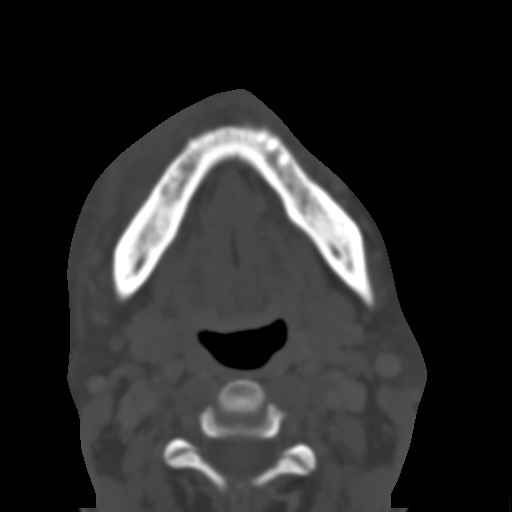
[im 30/86  bone]
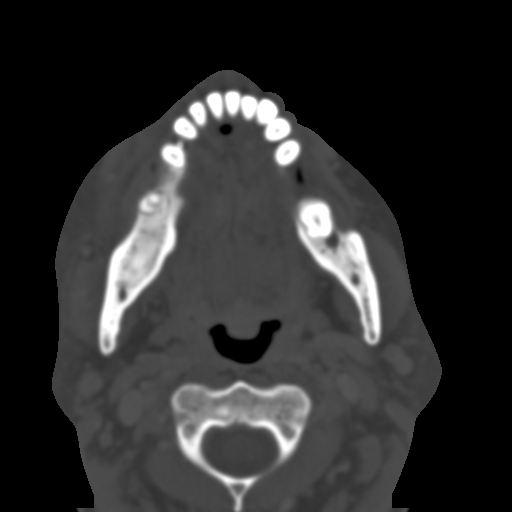
[im 39/86  brain]
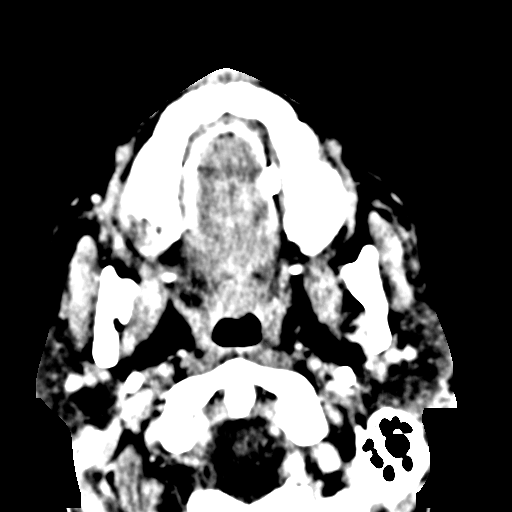
[im 39/86  bone]
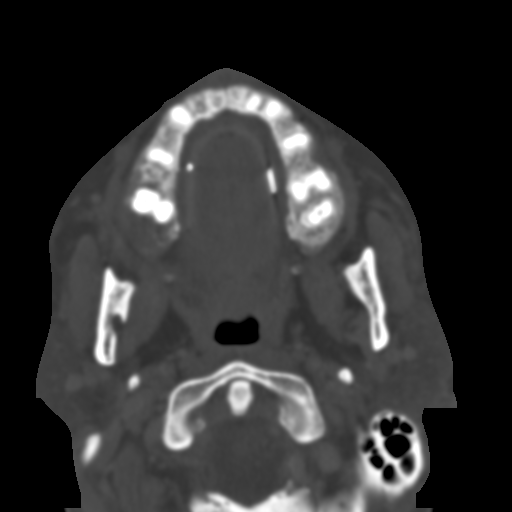
[im 47/86  bone]
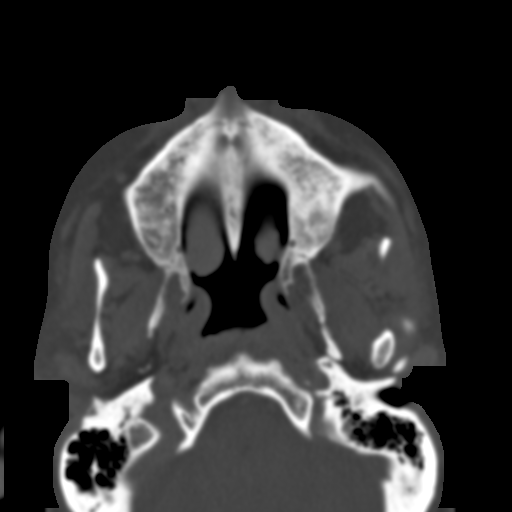
[im 56/86  bone]
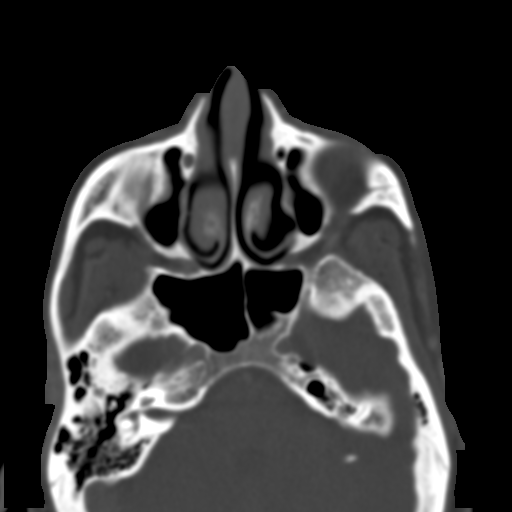
[im 65/86  bone]
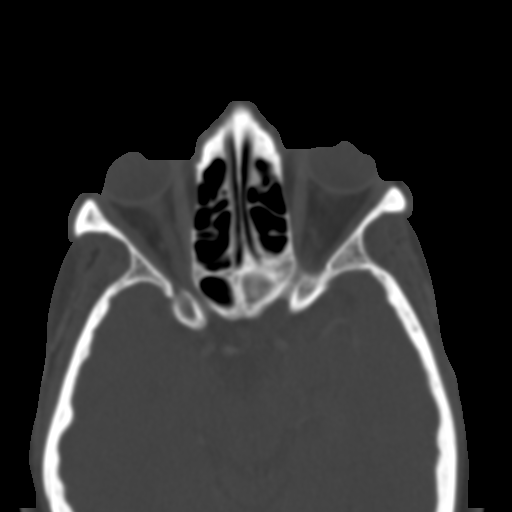
[im 71/86  brain]
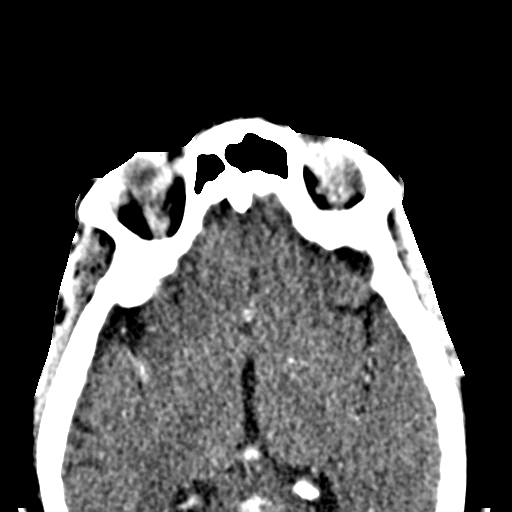
[im 71/86  bone]
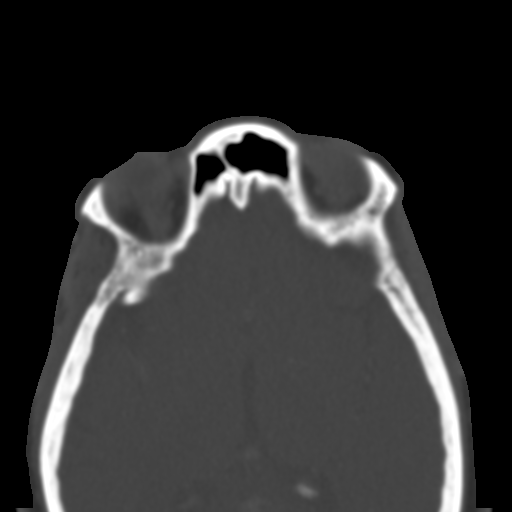
[im 80/86  bone]
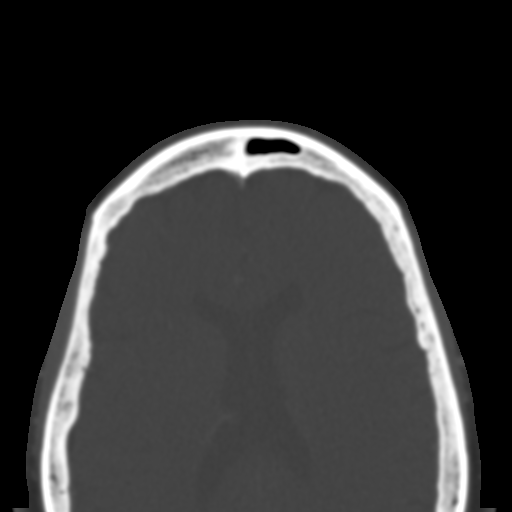

[Series 8: coronals · coronal · 0.32mm/px · 3 of 69 slices shown]
[im 23/69  bone]
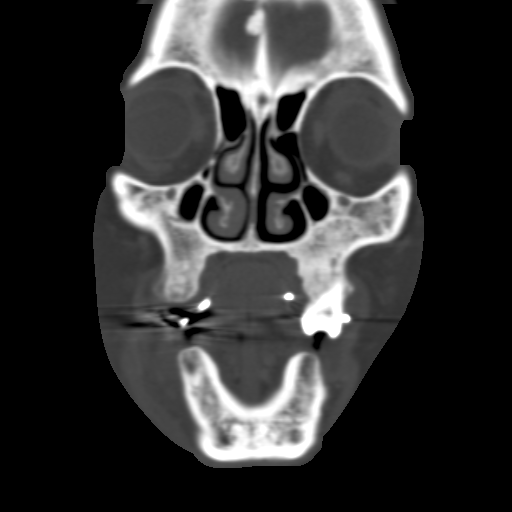
[im 31/69  bone]
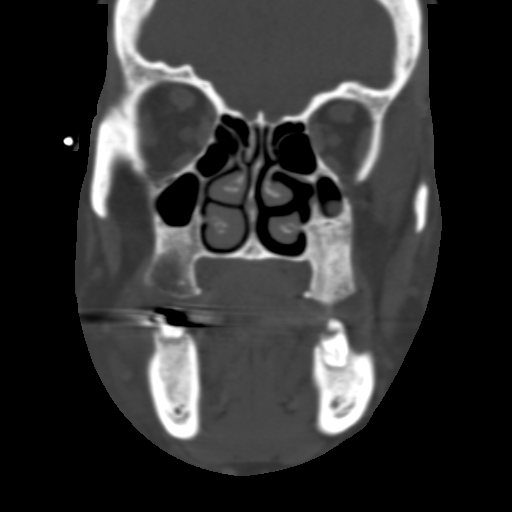
[im 38/69  bone]
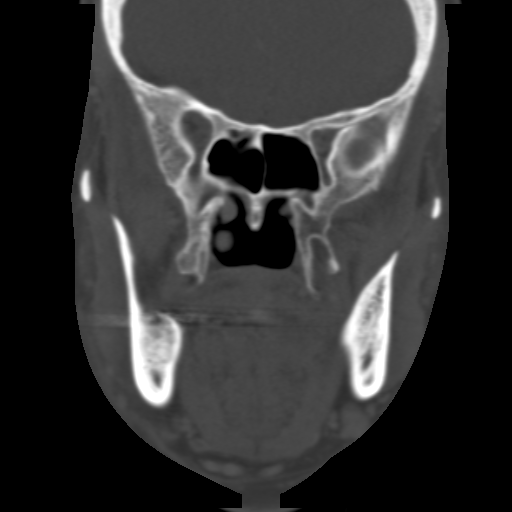

[Series 9: sagittals · sagittal · 0.33mm/px · 3 of 75 slices shown]
[im 25/75  bone]
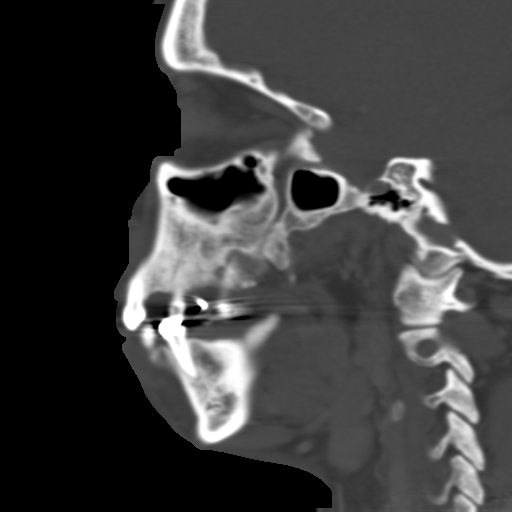
[im 38/75  bone]
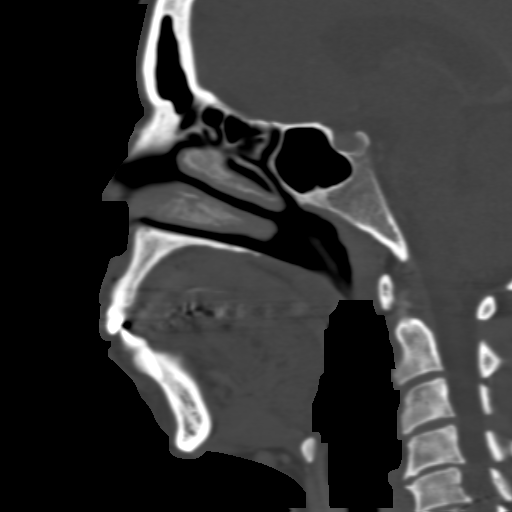
[im 50/75  bone]
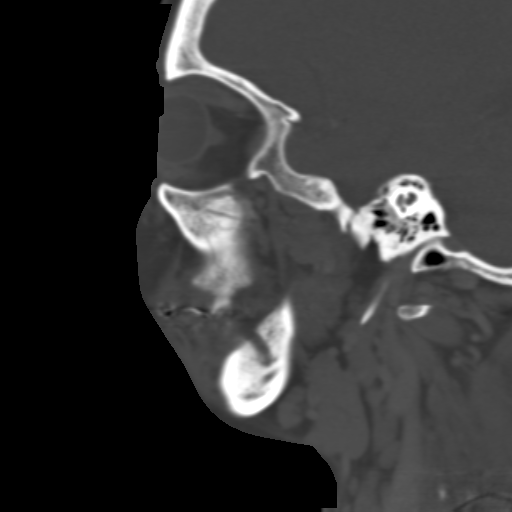

[16 of 47 positions shown; findings below may reference images not displayed]

FINDINGS: The patient has periapical abscesses around the roots of
teeth number 30 and 31.  There is also lucency around the last
remaining left mandibular molar consistent with periapical abscess.

There are numerous missing teeth as well as numerous caries.

There is soft tissue swelling in the subcutaneous fat of the right
side of the cheek with edema along the right mandibular body.

The submandibular glands and the parotid glands are normal.  The
tonsils are not enlarged.  No prevertebral soft tissue swelling.
The paranasal sinuses are clear as are the mastoid air cells.
Orbits are normal.  No adenopathy.
IMPRESSION: Right periapical abscess around the roots of tooth number 30
extending into the superficial soft tissues of the right cheek.
There also a periapical abscess around the roots of tooth number 31
as well as around the roots of the last remaining left mandibular
molar.

## 2012-02-07 MED ORDER — TRAMADOL HCL 50 MG PO TABS: 50.0000 mg | ORAL_TABLET | Freq: Three times a day (TID) | ORAL | Status: DC | PRN

## 2012-02-07 MED ORDER — IOHEXOL 300 MG/ML  SOLN
75.0000 mL | Freq: Once | INTRAMUSCULAR | Status: AC | PRN
  Administered 2012-02-07: 75 mL via INTRAVENOUS

## 2012-02-07 MED ORDER — AMOXICILLIN-POT CLAVULANATE 875-125 MG PO TABS: 1.0000 | ORAL_TABLET | Freq: Two times a day (BID) | ORAL | Status: DC

## 2012-02-07 NOTE — Progress Notes (Signed)
  Subjective:    Patient ID: Karen Robertson, female    DOB: 1960-06-27, 52 y.o.   MRN: [id number]  HPI  #1. Right-sided facial swelling: Patient in normal setup until she woke this morning she noted right-sided swelling along the right side of her jaw. She is not having this yesterday. She did state that she did not have her usual by mouth intake at dinner last night she "does not feel well." No nausea or vomiting overnight.   Did not want anything to eat this morning due to the pain in her jaw.  Has some chills this morning. No fevers or chills yesterday or overnight. No abdominal pain, nausea, vomiting.  Drinking well.  Swallowing well.  No trouble with airway, no drooling.  No trismus  Review of Systems See HPI above for review of systems.       Objective:   Physical Exam Gen:  Alert, cooperative patient who appears stated age in no acute distress.  Vital signs reviewed. Head:  [place]/AT Eyes:  PERRL Ears:  Clear BL Mouth:  Poor dental hygiene.  Some purulent discharge noted Back right 2 molars inferior mandible. Erythematous soft tissue swelling Right face.  Mildly tender to palpation, no fluctuance noted.  Some heat.  Neck:  No extension of swelling down into to neck.  No cervical lymphadenopathy.  Full lateral motion of neck without pain.  Full extension/flextion of neck.         Assessment & Plan:

## 2012-02-07 NOTE — Assessment & Plan Note (Signed)
Likely dental abscess, possibly just cellulitis. Will send for CT maxillofacial. Augmentin for relief.   No red flags, pain minimal, handling secretions well, speaking well, no trismus. If CT reveals abscess will need to refer to dentist vs oral surgeon.   Will call patient with results.

## 2012-02-07 NOTE — Patient Instructions (Addendum)
We are going to send you for a CT scan and x-ray of your head and jaw.  Depending on what that shows, we may need to get you to see a dentist or Facial surgeon.  If there is no abscess, we will see how you do with antibiotics.  Take the Augmentin twice daily for the next 10 days.    I will call you with the results of the CT scan today.  If you start having trouble drinking, swallowing, or breathing do not wait but go straight to the ED.

## 2012-02-07 NOTE — Telephone Encounter (Signed)
CT scan revealed to 3 apical abscess he is at tooth #30 and #31. I call patient to give her those results. I will try and see if we get her in to see a dentist tomorrow with the dentist on call. I am also going to send in some tramadol for pain relief for her.

## 2012-02-08 ENCOUNTER — Telehealth: Payer: Self-pay | Admitting: Family Medicine

## 2012-02-08 DIAGNOSIS — K122 Cellulitis and abscess of mouth: Secondary | ICD-10-CM

## 2012-02-08 NOTE — Telephone Encounter (Signed)
Called by nurse at Dr. Lacretia Leigh office. They need a referral as well as paperwork for what we did for Karen Robertson in clinic yesterday.  Will put in referral today.

## 2012-02-08 NOTE — Telephone Encounter (Signed)
Information faxed to [phone].Karen Robertson

## 2012-02-18 ENCOUNTER — Other Ambulatory Visit: Payer: Self-pay | Admitting: Family Medicine

## 2012-02-18 NOTE — Telephone Encounter (Signed)
Patient is calling because when she was in a few weeks ago, she thought the doctor was going to prescribe a fluid pill, but nothing was sent to her pharmacy so she is calling to check on this.

## 2012-02-19 NOTE — Telephone Encounter (Signed)
Left message for patient to return call. Please tell her that Lasix was sent to [facility] on 02/18/2012.Busick, Rodena Medin

## 2012-05-27 ENCOUNTER — Other Ambulatory Visit: Payer: Self-pay | Admitting: Emergency Medicine

## 2012-05-27 NOTE — Telephone Encounter (Signed)
Will refill 30 day supply.  Patient has not been seen for her blood pressure or had electrolytes checked in over 1 year.  With a history of hypokalemia, I will need to see her and check electrolytes before further refills are given.

## 2012-07-03 ENCOUNTER — Ambulatory Visit: Payer: BC Managed Care – PPO | Admitting: Emergency Medicine

## 2012-07-16 ENCOUNTER — Ambulatory Visit: Payer: BC Managed Care – PPO | Admitting: Emergency Medicine

## 2012-08-20 ENCOUNTER — Encounter: Payer: Self-pay | Admitting: Emergency Medicine

## 2012-08-20 ENCOUNTER — Ambulatory Visit (INDEPENDENT_AMBULATORY_CARE_PROVIDER_SITE_OTHER): Payer: BC Managed Care – PPO | Admitting: Emergency Medicine

## 2012-08-20 VITALS — BP 133/91 | HR 90 | Ht 65.5 in | Wt 166.0 lb

## 2012-08-20 DIAGNOSIS — I10 Essential (primary) hypertension: Secondary | ICD-10-CM

## 2012-08-20 DIAGNOSIS — R21 Rash and other nonspecific skin eruption: Secondary | ICD-10-CM

## 2012-08-20 DIAGNOSIS — K089 Disorder of teeth and supporting structures, unspecified: Secondary | ICD-10-CM

## 2012-08-20 LAB — COMPREHENSIVE METABOLIC PANEL
ALT: 25 U/L (ref 0–35)
AST: 47 U/L — ABNORMAL HIGH (ref 0–37)
Alkaline Phosphatase: 55 U/L (ref 39–117)
Creat: 0.71 mg/dL (ref 0.50–1.10)
Total Bilirubin: 0.8 mg/dL (ref 0.3–1.2)

## 2012-08-20 LAB — POCT GLYCOSYLATED HEMOGLOBIN (HGB A1C): Hemoglobin A1C: 5.3

## 2012-08-20 LAB — LIPID PANEL
Cholesterol: 217 mg/dL — ABNORMAL HIGH (ref 0–200)
Total CHOL/HDL Ratio: 3.7 Ratio

## 2012-08-20 MED ORDER — HYDROCHLOROTHIAZIDE 25 MG PO TABS: 25.0000 mg | ORAL_TABLET | Freq: Every day | ORAL | Status: DC

## 2012-08-20 MED ORDER — TRIAMCINOLONE ACETONIDE 0.025 % EX OINT: TOPICAL_OINTMENT | Freq: Two times a day (BID) | CUTANEOUS | Status: DC

## 2012-08-20 MED ORDER — PENICILLIN V POTASSIUM 500 MG PO TABS: 500.0000 mg | ORAL_TABLET | Freq: Four times a day (QID) | ORAL | Status: DC

## 2012-08-20 NOTE — Progress Notes (Signed)
  Subjective:    Patient ID: Karen Robertson, female    DOB: [birth year], 52 y.o.   MRN: [id number]  HPI Karen Robertson is here for f/u htn and tooth problems.  Hypertension Well controlled: yes Compliant with medication: yes Side effects from medication: no Check BP at home: no  Tooth infection She seen and treated for a dental abscess back in January 2014.  She reports that she has NOT seen a dentist.  The pain and swelling have completely resolved.  No fevers or pain currently.  Skin rash She has venous stasis changes in both legs.  Also describes 2 itchy dark plaques on the right leg.  Would like something to help with the itch.  Denies any redness or swelling of the skin.  I have reviewed and updated the following as appropriate: allergies and current medications SHx: never smoker  Review of Systems See HPI    Objective:   Physical Exam BP 133/91  Pulse 90  Ht 5' 5.5" (1.664 m)  Wt 166 lb (75.297 kg)  BMI 27.19 kg/m2 Gen: alert, cooperative, NAD HEENT: AT/[place], sclera white, MMM, poor dentition with broken molar on inferior left and worn down teeth on inferior right; mild erythema without pain on the inferior right Neck: supple, no LAD CV: RRR, no murmurs Pulm: CTAB, no wheezes or rales Ext: no edema Skin: 2 large hyperkeratotic and hyperpigmented plaques on the right lower leg, one over the anterior shin and one laterally, excoriations seen; no surrounding erythema or edema     Assessment & Plan:

## 2012-08-20 NOTE — Assessment & Plan Note (Signed)
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   In setting of venous stasis changes. No evidence for bacterial infection. Will treat with triamcinolone 0.025% ointment BID x2 weeks. If no improvement, will return after 2 weeks. Could consider biopsy at that time.

## 2012-08-20 NOTE — Assessment & Plan Note (Signed)
Well controlled today. Continue HCTZ 25mg  daily. Will check cmp, lipids and a1c today. Follow up in 3 months.

## 2012-08-20 NOTE — Patient Instructions (Addendum)
It was nice to see you!  Your blood pressure looks good today.  Continue the HCTZ daily. We are checking some blood work today.  I will call you if anything is wrong, otherwise you will get a letter with the results in the mail in 1-2 weeks.  I sent in an ointment to use on your skin.  Use it twice a day for 2 weeks, then as needed. If it is not getting better after 2 weeks please come back and see me.  I also sent in a prescription for an antibiotic (penicillin) for your teeth.  Take 1 pill 4 times a day for 10 days. I strongly recommend that you see a dentist.  Follow up in 3 months.

## 2012-08-20 NOTE — Assessment & Plan Note (Signed)
Encouraged her to see a dentist. Some concern for infection with erythema of the right inferior gums. Will treat with PCN V 500mg  QID x10 days.

## 2012-11-14 ENCOUNTER — Ambulatory Visit: Payer: BC Managed Care – PPO | Admitting: Emergency Medicine

## 2012-11-28 ENCOUNTER — Ambulatory Visit (INDEPENDENT_AMBULATORY_CARE_PROVIDER_SITE_OTHER): Payer: BC Managed Care – PPO | Admitting: Family Medicine

## 2012-11-28 VITALS — BP 139/89 | HR 87 | Temp 98.0°F | Wt 164.0 lb

## 2012-11-28 DIAGNOSIS — I872 Venous insufficiency (chronic) (peripheral): Secondary | ICD-10-CM

## 2012-11-28 NOTE — Assessment & Plan Note (Addendum)
The patient has chronic venous insufficiency with evidence of venous stasis ulceration -Will attempt trial of compression stockings -Patient was counseled to keep her feet elevated as much as possible throughout the day -Return to office in 2-3 weeks for follow up -Could consider future punch biopsy of the patches/ulcerations if there's no improvement with compression

## 2012-11-28 NOTE — Patient Instructions (Signed)
Swelling in legs with rash - this is due to venous insufficiency (the veins in your legs are not working correctly), please wear compression stocking daily when on your feet, elevate feet when able to, apply vaseline to affected areas as needed.   Return to office in 2-3 weeks to check on improvement of swelling/rash. Venous Stasis and Chronic Venous Insufficiency As people age, the veins located in their legs may weaken and stretch. When veins weaken and lose the ability to pump blood effectively, the condition is called chronic venous insufficiency (CVI) or venous stasis. Almost all veins return blood back to the heart. This happens by:  The force of the heart pumping fresh blood pushes blood back to the heart.  Blood flowing to the heart from the force of gravity. In the deep veins of the legs, blood has to fight gravity and flow upstream back to the heart. Here, the leg muscles contract to pump blood back toward the heart. Vein walls are elastic, and many veins have small valves that only allow blood to flow in one direction. When leg muscles contract, they push inward against the elastic vein walls. This squeezes blood upward, opens the valves, and moves blood toward the heart. When leg muscles relax, the vein [place] also relaxes and the valves inside the vein close to prevent blood from flowing backward. This method of pumping blood out of the legs is called the venous pump. CAUSES  The venous pump works best while walking and leg muscles are contracting. But when a person sits or stands, blood pressure in leg veins can build. Deep veins are usually able to withstand short periods of inactivity, but long periods of inactivity (and increased pressure) can stretch, weaken, and damage vein walls. High blood pressure can also stretch and damage vein walls. The veins may no longer be able to pump blood back to the heart. Venous hypertension (high blood pressure inside veins) that lasts over time is a  primary cause of CVI. CVI can also be caused by:   Deep vein thrombosis, a condition where a thrombus (blood clot) blocks blood flow in a vein.  Phlebitis, an inflammation of a superficial vein that causes a blood clot to form. Other risk factors for CVI may include:   Heredity.  Obesity.  Pregnancy.  Sedentary lifestyle.  Smoking.  Jobs requiring long periods of standing or sitting in one place.  Age and gender:  Women in their 5's and 61's and men in their 82's are more prone to developing CVI. SYMPTOMS  Symptoms of CVI may include:   Varicose veins.  Ulceration or skin breakdown.  Lipodermatosclerosis, a condition that affects the skin just above the ankle, usually on the inside surface. Over time the skin becomes brown, smooth, tight and often painful. Those with this condition have a high risk of developing skin ulcers.  Reddened or discolored skin on the leg.  Swelling. DIAGNOSIS  Your caregiver can diagnose CVI after performing a careful medical history and physical examination. To confirm the diagnosis, the following tests may also be ordered:   Duplex ultrasound.  Plethysmography (tests blood flow).  Venograms (x-ray using a special dye). TREATMENT The goals of treatment for CVI are to restore a person to an active life and to minimize pain or disability. Typically, CVI does not pose a serious threat to life or limb, and with proper treatment most people with this condition can continue to lead active lives. In most cases, mild CVI can be treated  on an outpatient basis with simple procedures. Treatment methods include:   Elastic compression socks.  Sclerotherapy, a procedure involving an injection of a material that "dissolves" the damaged veins. Other veins in the network of blood vessels take over the function of the damaged veins.  Vein stripping (an older procedure less commonly used).  Laser Ablation surgery.  Valve repair. HOME CARE INSTRUCTIONS     Elastic compression socks must be worn every day. They can help with symptoms and lower the chances of the problem getting worse, but they do not cure the problem.  Only take over-the-counter or prescription medicines for pain, discomfort, or fever as directed by your caregiver.  Your caregiver will review your other medications with you. SEEK MEDICAL CARE IF:   You are confused about how to take your medications.  There is redness, swelling, or increasing pain in the affected area.  There is a red streak or line that extends up or down from the affected area.  There is a breakdown or loss of skin in the affected area, even if the breakdown is small.  You develop an unexplained oral temperature above 102 F (38.9 C).  There is an injury to the affected area. SEEK IMMEDIATE MEDICAL CARE IF:   There is an injury and open wound to the affected area.  Pain is not adequately relieved with pain medication prescribed or becomes severe.  An oral temperature above 102 F (38.9 C) develops.  The foot/ankle below the affected area becomes suddenly numb or the area feels weak and hard to move. MAKE SURE YOU:   Understand these instructions.  Will watch your condition.  Will get help right away if you are not doing well or get worse. Document Released: 05/28/2006 Document Revised: 04/16/2011 Document Reviewed: 08/05/2006 [facility] Patient Information 2014 [place], [place].

## 2012-11-28 NOTE — Progress Notes (Signed)
  Subjective:    Patient ID: Karen Robertson, female    DOB: 1960/06/07, 52 y.o.   MRN: [id number]  HPI 52 year old female presents for followup of venous stasis ulcerations, she was last seen and evaluated in July of 2014, she did attempt trial of triamcinolone cream which provided minimal relief of the irritation in her lower extremities, she works as a Financial risk analyst, has had chronic venous insufficiency for a number of years, states that her swelling in her legs is worse at the end of her workday, improved in the morning, she does not wear compression stockings, denies a history of cardiac disease, currently denies chest pain, no PND, no orthopnea, occasionally she'll areas of the legs that get irritated and will bleed from her scratching   Review of Systems  Constitutional: Negative for fever, chills and fatigue.  Respiratory: Negative for cough, choking and shortness of breath.   Cardiovascular: Negative for chest pain.  Skin: Positive for color change, rash and wound. Negative for pallor.       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 Vascular: She has 2+ dorsalis pedis and posterior tibialis  pulses, capillary refill of the lower extremities was within normal limits        Assessment & Plan:  Please see problem specific assessment and plan

## 2013-06-12 ENCOUNTER — Ambulatory Visit: Payer: BC Managed Care – PPO

## 2013-09-01 ENCOUNTER — Other Ambulatory Visit: Payer: Self-pay | Admitting: *Deleted

## 2013-09-01 DIAGNOSIS — I10 Essential (primary) hypertension: Secondary | ICD-10-CM

## 2013-09-01 MED ORDER — HYDROCHLOROTHIAZIDE 25 MG PO TABS: 25.0000 mg | ORAL_TABLET | Freq: Every day | ORAL | Status: DC

## 2014-09-22 ENCOUNTER — Ambulatory Visit (HOSPITAL_COMMUNITY)
Admission: RE | Admit: 2014-09-22 | Discharge: 2014-09-22 | Disposition: A | Discharge status: Home / Self Care | Payer: BLUE CROSS/BLUE SHIELD | Source: Ambulatory Visit | Attending: Family Medicine | Admitting: Family Medicine

## 2014-09-22 ENCOUNTER — Encounter: Payer: Self-pay | Admitting: Internal Medicine

## 2014-09-22 ENCOUNTER — Ambulatory Visit (HOSPITAL_COMMUNITY): Admission: RE | Admit: 2014-09-22 | Payer: BLUE CROSS/BLUE SHIELD | Source: Ambulatory Visit

## 2014-09-22 ENCOUNTER — Ambulatory Visit (INDEPENDENT_AMBULATORY_CARE_PROVIDER_SITE_OTHER): Payer: BLUE CROSS/BLUE SHIELD | Admitting: Internal Medicine

## 2014-09-22 VITALS — BP 138/88 | HR 88 | Temp 98.8°F | Ht 60.0 in | Wt 156.5 lb

## 2014-09-22 DIAGNOSIS — I878 Other specified disorders of veins: Secondary | ICD-10-CM

## 2014-09-22 DIAGNOSIS — M7989 Other specified soft tissue disorders: Secondary | ICD-10-CM

## 2014-09-22 MED ORDER — TRIAMCINOLONE ACETONIDE 0.1 % EX CREA: 1.0000 "application " | TOPICAL_CREAM | Freq: Two times a day (BID) | CUTANEOUS | Status: DC

## 2014-09-22 NOTE — Assessment & Plan Note (Signed)
  Left Arm Swelling/Lypmphedema - Differential including clot vs. Autoimmune vs. Mass - UE U/S of Arm - rule out clot  - Hand X-ray to check degenerative joint disease  - ANA/ Rheumatoid Factor -- Autoimmune including lupus and RA  - CXR - to check for possible mass effect process  - CMET - to check albumin

## 2014-09-22 NOTE — Patient Instructions (Signed)
You were seen for arm swelling today. We will get some blood test to try to identify swelling in your arm.  Will want to get imaging now, and we will also want you to get a chest x-ray and hand x-ray to determine if any other ongoing acute process.   For your venous stasis we have prescribed some steroid cream. Please use this for the next two weeks to help with itching. Furthermore, I have prescribed some compression stockings.   Please follow up in the next two weeks to reschedule pap smear.

## 2014-09-22 NOTE — Progress Notes (Signed)
Patient ID: Karen Robertson, female   DOB: Feb 08, 1960, 54 y.o.   MRN: [id number]    [facility] Kerrin Mo, MD Phone: [phone]  Subjective:   Screenings  Patient was counseled on breast cancer screening -- given info to set up a mammogram  Counseled on Colorectal cancer screening  Pap smear screening questions- no vaginal discharge, sexually active last month with 1 partner, uses condoms, still has regular menstrual cycles  Swelling in Left Arm  Patient states that arm has been swollen for about 1 week. She states that she has been using an ace bandage to help with pain and swelling. She denies a history of repetat ive movement. Denies numbness or tingling. However indicates increased weakness in hand due to the swelling, there is not association of weakness or pain being worse in the morning. No family hx of Rheumatoid arthritis or Lupus. She states she has never had swelling like this before in any of her joints. She denies any new medications. Denies any trauma associated with onset of swelling. She indicates she has full range of motion with her arm. Denies any recent bites.    Right leg Venous Stasis  Patient has chronic itching of her right leg which has been going on for several years.  She has a long hx of venous stasis. 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 Has used compression stockings in the past but has not used any of these recently    All relevant systems were reviewed and were negative unless otherwise noted in the HPI  Past Medical History Reviewed problem list.  Medications- reviewed and updated Current Outpatient Prescriptions  Medication Sig Dispense Refill  . aspirin (ASPIR-LOW) 81 MG EC tablet Take 81 mg by mouth daily.      . hydrochlorothiazide (HYDRODIURIL) 25 MG tablet Take 1 tablet (25 mg total) by mouth daily. 30 tablet 11   No current facility-administered medications for this visit.   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 Autoimmune vs. Mass - UE U/S of Arm - rule out clot  - Hand X-ray to check degenerative joint disease  - ANA/ Rheumatoid Factor -- Autoimmune including lupus and RA  - CXR - to check for possible mass effect process  - CMET - to check albumin   Venous stasis  - Compress stockings and elevation of legs  - triamcinolone  cream for itching   Screening  -  Educated patient on Colorectal cancer screening -- wants to think about it  - Will set up appointment for Mammogram  -  Will need Pap smear, follow up in 2 weeks

## 2014-09-22 NOTE — Assessment & Plan Note (Signed)
Venous stasis  - Compress stockings and elevation of legs -- last seen in 2014 for this. No ulceration currently  - triamcinolone  cream for itching

## 2014-09-24 LAB — COMPREHENSIVE METABOLIC PANEL
ALT: 23 U/L (ref 6–29)
AST: 53 U/L — ABNORMAL HIGH (ref 10–35)
Albumin: 4.4 g/dL (ref 3.6–5.1)
Alkaline Phosphatase: 47 U/L (ref 33–130)
BILIRUBIN TOTAL: 0.6 mg/dL (ref 0.2–1.2)
BUN: 10 mg/dL (ref 7–25)
CALCIUM: 9.5 mg/dL (ref 8.6–10.4)
CHLORIDE: 102 mmol/L (ref 98–110)
CO2: 20 mmol/L (ref 20–31)
CREATININE: 0.67 mg/dL (ref 0.50–1.05)
Glucose, Bld: 85 mg/dL (ref 65–99)
Potassium: 3.9 mmol/L (ref 3.5–5.3)
SODIUM: 140 mmol/L (ref 135–146)
Total Protein: 7.1 g/dL (ref 6.1–8.1)

## 2014-09-24 LAB — RHEUMATOID FACTOR: Rhuematoid fact SerPl-aCnc: <10 IU/mL (ref <=14)

## 2014-09-28 ENCOUNTER — Other Ambulatory Visit: Payer: Self-pay | Admitting: *Deleted

## 2014-09-28 DIAGNOSIS — I10 Essential (primary) hypertension: Secondary | ICD-10-CM

## 2014-09-29 MED ORDER — HYDROCHLOROTHIAZIDE 25 MG PO TABS: 25.0000 mg | ORAL_TABLET | Freq: Every day | ORAL | Status: DC

## 2014-10-05 LAB — ANA: ANA: NEGATIVE

## 2014-10-14 ENCOUNTER — Encounter: Payer: Self-pay | Admitting: Internal Medicine

## 2014-10-15 ENCOUNTER — Other Ambulatory Visit: Payer: Self-pay

## 2014-10-15 DIAGNOSIS — Z1231 Encounter for screening mammogram for malignant neoplasm of breast: Secondary | ICD-10-CM

## 2014-11-03 ENCOUNTER — Telehealth: Payer: Self-pay | Admitting: Internal Medicine

## 2014-11-03 ENCOUNTER — Ambulatory Visit: Payer: BLUE CROSS/BLUE SHIELD | Admitting: Internal Medicine

## 2014-11-03 DIAGNOSIS — I10 Essential (primary) hypertension: Secondary | ICD-10-CM

## 2014-11-03 MED ORDER — HYDROCHLOROTHIAZIDE 25 MG PO TABS: 25.0000 mg | ORAL_TABLET | Freq: Every day | ORAL | Status: DC

## 2014-11-03 NOTE — Telephone Encounter (Signed)
Need refill on HCTZ.  [facility] [address]

## 2014-11-24 ENCOUNTER — Other Ambulatory Visit (HOSPITAL_COMMUNITY)
Admission: RE | Admit: 2014-11-24 | Discharge: 2014-11-24 | Disposition: A | Discharge status: Home / Self Care | Payer: BLUE CROSS/BLUE SHIELD | Source: Ambulatory Visit | Attending: Family Medicine | Admitting: Family Medicine

## 2014-11-24 ENCOUNTER — Ambulatory Visit (INDEPENDENT_AMBULATORY_CARE_PROVIDER_SITE_OTHER): Payer: BLUE CROSS/BLUE SHIELD | Admitting: Internal Medicine

## 2014-11-24 ENCOUNTER — Encounter: Payer: Self-pay | Admitting: Internal Medicine

## 2014-11-24 VITALS — BP 112/78 | HR 74 | Temp 98.3°F | Ht 60.0 in | Wt 148.0 lb

## 2014-11-24 DIAGNOSIS — Z124 Encounter for screening for malignant neoplasm of cervix: Secondary | ICD-10-CM | POA: Diagnosis not present

## 2014-11-24 DIAGNOSIS — Z0000 Encounter for general adult medical examination without abnormal findings: Secondary | ICD-10-CM | POA: Diagnosis not present

## 2014-11-24 DIAGNOSIS — Z01419 Encounter for gynecological examination (general) (routine) without abnormal findings: Secondary | ICD-10-CM | POA: Diagnosis present

## 2014-11-24 DIAGNOSIS — Z1151 Encounter for screening for human papillomavirus (HPV): Secondary | ICD-10-CM | POA: Diagnosis not present

## 2014-11-24 NOTE — Patient Instructions (Addendum)
Will see you back in 2 months. Referring Doctor for mammogram is Dr. Kerrin Mo

## 2014-11-24 NOTE — Progress Notes (Signed)
Pt received flu shot at work.  She will find out date and she will call us back with information. Leslie Langille, Salome Spotted

## 2014-11-24 NOTE — Progress Notes (Signed)
Patient ID: Karen Robertson, female   DOB: [birth year], 54 y.o.   MRN: [id number]   [facility] Kerrin Mo, MD Phone: [phone]  Subjective:   # Pap Smear: Patient currently sexually active. Has 1 partner, and feel safe. Partner uses condoms. Furthermore patient has a had tubal ligation. Patient's last period was in February, has not had a period since then. Before this patient was having regular periods   All relevant systems were reviewed and were negative unless otherwise noted in the HPI  Past Medical History Reviewed problem list.  Medications- reviewed and updated Current Outpatient Prescriptions  Medication Sig Dispense Refill  . aspirin (ASPIR-LOW) 81 MG EC tablet Take 81 mg by mouth daily.      . hydrochlorothiazide (HYDRODIURIL) 25 MG tablet Take 1 tablet (25 mg total) by mouth daily. 30 tablet 6  . triamcinolone cream (KENALOG) 0.1 % Apply 1 application topically 2 (two) times daily. Apply to left leg twice a day for itching due to venous stasis 30 g 0   No current facility-administered medications for this visit.   Chief complaint-noted No additions to family history Social history- patient is a non-smoker  Objective: BP 112/78 mmHg  Pulse 74  Temp(Src) 98.3 F (36.8 C) (Oral)  Ht 5' (1.524 m)  Wt 148 lb (67.132 kg)  BMI 28.90 kg/m2 Gen: NAD, alert, cooperative with exam Abd: SNTND, BS present, no guarding or organomegaly Ext: No edema, warm, normal tone, moves UE/LE spontaneously GU Exam: Pelvic exmaination reveals normal external genitalia, and normal vagina and cervix on speculum examination. Bimanual examination reveals no palpable uterus, ovaries, or masses.  Assessment/Plan: See problem based a/p  Health care maintenance Women's Health Maintenance  -  1 sexual partner, uses condoms, feels safe - Pap smear with cytology and HPV  - Patient to have a mammogram  - F/U in 2 months to get HCV and Tetanus Shot etc.

## 2014-11-24 NOTE — Assessment & Plan Note (Signed)
Women's Health Maintenance  -  1 sexual partner, uses condoms, feels safe - Pap smear with cytology and HPV  - Patient to have a mammogram  - F/U in 2 months to get HCV and Tetanus Shot etc.

## 2014-11-25 LAB — CYTOLOGY - PAP

## 2014-11-29 ENCOUNTER — Telehealth: Payer: Self-pay | Admitting: Internal Medicine

## 2014-11-29 NOTE — Telephone Encounter (Signed)
Pt is returning a call from our office. jw

## 2014-11-29 NOTE — Telephone Encounter (Signed)
Called patient, was informed that she was at work, asked that patient calls office.

## 2014-12-01 NOTE — Telephone Encounter (Signed)
Called again, no answer, voicemail full. 

## 2014-12-07 ENCOUNTER — Encounter: Payer: Self-pay | Admitting: Internal Medicine

## 2014-12-24 ENCOUNTER — Ambulatory Visit: Payer: BLUE CROSS/BLUE SHIELD

## 2015-01-14 ENCOUNTER — Ambulatory Visit: Payer: BLUE CROSS/BLUE SHIELD

## 2015-04-15 ENCOUNTER — Ambulatory Visit: Payer: BLUE CROSS/BLUE SHIELD

## 2015-04-19 ENCOUNTER — Ambulatory Visit: Payer: BLUE CROSS/BLUE SHIELD | Admitting: Internal Medicine

## 2015-04-29 ENCOUNTER — Ambulatory Visit: Payer: BLUE CROSS/BLUE SHIELD

## 2015-05-13 ENCOUNTER — Ambulatory Visit: Payer: BLUE CROSS/BLUE SHIELD

## 2015-07-07 ENCOUNTER — Other Ambulatory Visit: Payer: Self-pay | Admitting: *Deleted

## 2015-07-07 DIAGNOSIS — I10 Essential (primary) hypertension: Secondary | ICD-10-CM

## 2015-07-07 MED ORDER — HYDROCHLOROTHIAZIDE 25 MG PO TABS: 25.0000 mg | ORAL_TABLET | Freq: Every day | ORAL | Status: DC

## 2015-12-02 ENCOUNTER — Ambulatory Visit
Admission: RE | Admit: 2015-12-02 | Discharge: 2015-12-02 | Disposition: A | Discharge status: Home / Self Care | Payer: BLUE CROSS/BLUE SHIELD | Source: Ambulatory Visit

## 2015-12-02 DIAGNOSIS — Z1231 Encounter for screening mammogram for malignant neoplasm of breast: Secondary | ICD-10-CM | POA: Diagnosis not present

## 2016-03-15 ENCOUNTER — Encounter (HOSPITAL_COMMUNITY): Payer: Self-pay | Admitting: Emergency Medicine

## 2016-03-15 ENCOUNTER — Emergency Department (HOSPITAL_COMMUNITY)
Admission: EM | Admit: 2016-03-15 | Discharge: 2016-03-15 | Disposition: A | Discharge status: Home / Self Care | Payer: BLUE CROSS/BLUE SHIELD | Attending: Emergency Medicine | Admitting: Emergency Medicine

## 2016-03-15 DIAGNOSIS — S01412A Laceration without foreign body of left cheek and temporomandibular area, initial encounter: Secondary | ICD-10-CM | POA: Diagnosis not present

## 2016-03-15 DIAGNOSIS — Y999 Unspecified external cause status: Secondary | ICD-10-CM | POA: Insufficient documentation

## 2016-03-15 DIAGNOSIS — Y929 Unspecified place or not applicable: Secondary | ICD-10-CM | POA: Diagnosis not present

## 2016-03-15 DIAGNOSIS — Z79899 Other long term (current) drug therapy: Secondary | ICD-10-CM | POA: Insufficient documentation

## 2016-03-15 DIAGNOSIS — S0083XA Contusion of other part of head, initial encounter: Secondary | ICD-10-CM | POA: Diagnosis not present

## 2016-03-15 DIAGNOSIS — I10 Essential (primary) hypertension: Secondary | ICD-10-CM | POA: Insufficient documentation

## 2016-03-15 DIAGNOSIS — S0181XA Laceration without foreign body of other part of head, initial encounter: Secondary | ICD-10-CM | POA: Diagnosis not present

## 2016-03-15 DIAGNOSIS — Z7982 Long term (current) use of aspirin: Secondary | ICD-10-CM | POA: Insufficient documentation

## 2016-03-15 DIAGNOSIS — S0993XA Unspecified injury of face, initial encounter: Secondary | ICD-10-CM | POA: Diagnosis present

## 2016-03-15 DIAGNOSIS — Y939 Activity, unspecified: Secondary | ICD-10-CM | POA: Diagnosis not present
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
## 2016-03-30 ENCOUNTER — Encounter: Payer: Self-pay | Admitting: Internal Medicine

## 2016-03-30 ENCOUNTER — Ambulatory Visit (INDEPENDENT_AMBULATORY_CARE_PROVIDER_SITE_OTHER): Payer: BLUE CROSS/BLUE SHIELD | Admitting: Internal Medicine

## 2016-03-30 VITALS — BP 112/70 | HR 85 | Temp 98.5°F | Ht 67.0 in | Wt 150.6 lb

## 2016-03-30 DIAGNOSIS — I878 Other specified disorders of veins: Secondary | ICD-10-CM | POA: Diagnosis not present

## 2016-03-30 DIAGNOSIS — I872 Venous insufficiency (chronic) (peripheral): Secondary | ICD-10-CM

## 2016-03-30 DIAGNOSIS — I10 Essential (primary) hypertension: Secondary | ICD-10-CM | POA: Diagnosis not present

## 2016-03-30 DIAGNOSIS — Z1159 Encounter for screening for other viral diseases: Secondary | ICD-10-CM | POA: Diagnosis not present

## 2016-03-30 LAB — COMPLETE METABOLIC PANEL WITH GFR
ALBUMIN: 4.1 g/dL (ref 3.6–5.1)
ALT: 14 U/L (ref 6–29)
AST: 26 U/L (ref 10–35)
Alkaline Phosphatase: 45 U/L (ref 33–130)
BUN: 9 mg/dL (ref 7–25)
CALCIUM: 9.4 mg/dL (ref 8.6–10.4)
CHLORIDE: 104 mmol/L (ref 98–110)
CO2: 27 mmol/L (ref 20–31)
CREATININE: 0.71 mg/dL (ref 0.50–1.05)
GFR, Est African American: >89 mL/min (ref >=60)
GFR, Est Non African American: >89 mL/min (ref >=60)
GLUCOSE: 93 mg/dL (ref 65–99)
Potassium: 3.7 mmol/L (ref 3.5–5.3)
SODIUM: 141 mmol/L (ref 135–146)
Total Bilirubin: 0.8 mg/dL (ref 0.2–1.2)
Total Protein: 6.8 g/dL (ref 6.1–8.1)

## 2016-03-30 LAB — CBC
HCT: 35.3 % (ref 35.0–45.0)
HEMOGLOBIN: 12.1 g/dL (ref 11.7–15.5)
MCH: 32.4 pg (ref 27.0–33.0)
MCHC: 34.3 g/dL (ref 32.0–36.0)
MCV: 94.6 fL (ref 80.0–100.0)
MPV: 10.2 fL (ref 7.5–12.5)
Platelets: 200 10*3/uL (ref 140–400)
RBC: 3.73 MIL/uL — ABNORMAL LOW (ref 3.80–5.10)
RDW: 14.7 % (ref 11.0–15.0)
WBC: 3.8 10*3/uL (ref 3.8–10.8)

## 2016-03-30 LAB — TSH: TSH: 0.99 m[IU]/L

## 2016-03-30 MED ORDER — TRIAMCINOLONE ACETONIDE 0.1 % EX CREA: 1.0000 "application " | TOPICAL_CREAM | Freq: Two times a day (BID) | CUTANEOUS | 0 refills | Status: DC

## 2016-03-30 NOTE — Patient Instructions (Addendum)
I want you to follow up with vascular surgery for venous stasis. Refilled your cream. I also want you to try to get compression stockings to help with the symptoms. Please follow up with me in 6 months. Please let me know if any issues.
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
## 2016-03-31 LAB — HEPATITIS C ANTIBODY: HCV Ab: NEGATIVE

## 2016-03-31 LAB — HIV ANTIBODY (ROUTINE TESTING W REFLEX): HIV 1&2 Ab, 4th Generation: NONREACTIVE

## 2016-04-03 NOTE — Assessment & Plan Note (Addendum)
Severe varicose veins, mild swelling, with significant venous stasis dermatitis. Has never tried compression stockings due to cost and lack of insurance coverage  Less likely liver, kidney's, anemia, or thyriod, however will check these.  - Check LFTs, CBC and TSH  - Ambulatory referral to Vascular Surgery - severe varicose veins  - Re-emphasized the use  Of compression stockings  - triamcinolone cream (KENALOG) 0.1 %; Apply 1 application topically 2 (two) times daily. Apply to left leg twice a day for itching due to venous stasis  Dispense: 80 g; Refill: 0 - Discussed with Dr. Ree Kida as had previously seen patient for this issue

## 2016-04-03 NOTE — Assessment & Plan Note (Signed)
Controlled  Continue HCTZ 25 mg daily  Follow up CMET  Follow up in 3-6 months

## 2016-04-10 ENCOUNTER — Encounter: Payer: Self-pay | Admitting: Internal Medicine

## 2016-04-25 ENCOUNTER — Telehealth: Payer: Self-pay | Admitting: Internal Medicine

## 2016-04-25 DIAGNOSIS — I10 Essential (primary) hypertension: Secondary | ICD-10-CM

## 2016-04-25 NOTE — Telephone Encounter (Signed)
The patient was taken off her BP meds and wanted to know why?  Also, Patient here today for BP check.  FMLA form dropped off for at front desk for completion.  Verified that patient section of form has been completed.  Last DOS/WCC with PCP was 03/30/16.  Placed form in team folder to be completed by clinical staff.  Crista Luria

## 2016-04-26 NOTE — Telephone Encounter (Signed)
Forms placed in PCP box. 

## 2016-04-27 MED ORDER — HYDROCHLOROTHIAZIDE 25 MG PO TABS: 25.0000 mg | ORAL_TABLET | Freq: Every day | ORAL | 4 refills | Status: DC

## 2016-04-27 NOTE — Telephone Encounter (Signed)
Please let patient know she has not been taken off her blood pressure medication. She may however need a refill on her medication which is why the pharmacy does not have the medication. I will send in a refill.

## 2016-04-27 NOTE — Telephone Encounter (Signed)
Tried calling x 2 no answer no voicemail.

## 2016-05-01 ENCOUNTER — Telehealth: Payer: Self-pay | Admitting: Internal Medicine

## 2016-05-01 NOTE — Telephone Encounter (Signed)
Called again, no answer, no voicemail. 

## 2016-05-01 NOTE — Telephone Encounter (Signed)
Called Karen Robertson to determine why she needed FMLA paperwork filled out. She stated that she was going to get surgery of for her venous stasis and will need to have time off of work. I explained her vascular surgeon would be the most appropriate person to fill out this form.

## 2016-05-02 ENCOUNTER — Telehealth: Payer: Self-pay

## 2016-05-02 NOTE — Telephone Encounter (Signed)
Pt came into office and dropped off FMLA paperwork to be filled out. Pt will be coming in for an initial varicose vein consult and u/s. FMLA is not necessary at the time. Spoke with pt. Pt seems to under stand at this time. I filed the blank FMLA papers in case they are needed later.

## 2016-05-14 ENCOUNTER — Encounter: Payer: Self-pay | Admitting: Vascular Surgery

## 2016-05-21 ENCOUNTER — Other Ambulatory Visit: Payer: Self-pay | Admitting: *Deleted

## 2016-05-21 DIAGNOSIS — I83893 Varicose veins of bilateral lower extremities with other complications: Secondary | ICD-10-CM

## 2016-05-23 ENCOUNTER — Encounter: Payer: Self-pay | Admitting: Vascular Surgery

## 2016-05-23 ENCOUNTER — Ambulatory Visit (HOSPITAL_COMMUNITY)
Admission: RE | Admit: 2016-05-23 | Discharge: 2016-05-23 | Disposition: A | Discharge status: Home / Self Care | Payer: BLUE CROSS/BLUE SHIELD | Source: Ambulatory Visit | Attending: Vascular Surgery | Admitting: Vascular Surgery

## 2016-05-23 ENCOUNTER — Ambulatory Visit (INDEPENDENT_AMBULATORY_CARE_PROVIDER_SITE_OTHER): Payer: BLUE CROSS/BLUE SHIELD | Admitting: Vascular Surgery

## 2016-05-23 VITALS — BP 122/86 | HR 96 | Temp 98.8°F | Resp 16 | Ht 67.0 in | Wt 151.0 lb

## 2016-05-23 DIAGNOSIS — I83893 Varicose veins of bilateral lower extremities with other complications: Secondary | ICD-10-CM | POA: Diagnosis not present

## 2016-05-23 DIAGNOSIS — I872 Venous insufficiency (chronic) (peripheral): Secondary | ICD-10-CM

## 2016-05-23 NOTE — Progress Notes (Signed)
Patient name: Karen Robertson MRN: [id number] DOB: 10/29/1960 Sex: female  REASON FOR CONSULT: Varicose veins.  HPI: Karen Robertson is a 56 y.o. female, who presents for evaluation of chronic venous insufficiency. She was referred by [facility]. 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 She's had progressive issues with varicose veins and describes aching pain and heaviness in both lower extremities which is aggravated by standing and relieved with elevation. She denies any previous history of DVT or phlebitis.  She was just recently prescribed knee-high compression stockings. She does not know the specific gradient but thinks they might be 15-20 mmHg.  I have reviewed the records that were sent with the patient from [facility]. The patient does have chronic hypertension which is been under good control. The patient also has chronic venous stasis and is referred for vascular consultation.  Past Medical History:  Diagnosis Date  . Hypertension   . Venous insufficiency     Family History  Problem Relation Age of Onset  . Diabetes Mother   . Hypertension Mother     SOCIAL HISTORY: Social History   Social History  . Marital status: Single    Spouse name: N/A  . Number of children: N/A  . Years of education: N/A   Occupational History  . Not on file.   Social History Main Topics  . Smoking status: Never Smoker  . Smokeless tobacco: Never Used  . Alcohol use Yes  . Drug use: No  . Sexual activity: Not on file   Other Topics Concern  . 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 aspirin  (ASPIR-LOW) 81 MG EC tablet Take 81 mg by mouth daily.      . hydrochlorothiazide (HYDRODIURIL) 25 MG tablet Take 1 tablet (25 mg total) by mouth daily. 60 tablet 4  . triamcinolone cream (KENALOG) 0.1 % Apply 1 application topically 2 (two) times daily. Apply to left leg twice a day for itching due to venous stasis 80 g 0   No current facility-administered medications for this visit.     REVIEW OF SYSTEMS:  [X]  denotes positive finding, [ ]  denotes negative finding Cardiac  Comments:  Chest pain or chest pressure:    Shortness of breath upon exertion:    Short of breath when lying flat:    Irregular heart rhythm:        Vascular    Pain in calf, thigh, or hip brought on by ambulation:    Pain in feet at night that wakes you up from your sleep:     Blood clot in your veins:    Leg swelling:         Pulmonary    Oxygen at home:    Productive cough:     Wheezing:         Neurologic    Sudden weakness in arms or legs:     Sudden numbness in arms or legs:     Sudden onset of difficulty speaking or slurred speech:  Temporary loss of vision in one eye:     Problems with dizziness:         Gastrointestinal    Blood in stool:     Vomited blood:         Genitourinary    Burning when urinating:     Blood in urine:        Psychiatric    Major depression:         Hematologic    Bleeding problems:    Problems with blood clotting too easily:        Skin    Rashes or ulcers:        Constitutional    Fever or chills:      PHYSICAL EXAM: Vitals:   05/23/16 1506  BP: 122/86  Pulse: 96  Resp: 16  Temp: 98.8 F (37.1 C)  TempSrc: Oral  SpO2: 98%  Weight: 151 lb (68.5 kg)  Height: 5\' 7"  (1.702 m)    GENERAL: The patient is a well-nourished female, in no acute distress. The vital signs are documented above. CARDIAC: There is a regular rate and rhythm.  VASCULAR: I did not detect carotid bruits. She has palpable femoral, popliteal, and dorsalis pedis pulses  bilaterally. She has mild bilateral lower extremity swelling. She has hyperpigmentation bilaterally consistent with chronic venous insufficiency.  She has extensive varicose veins in both lower extremities. PULMONARY: There is good air exchange bilaterally without wheezing or rales. ABDOMEN: Soft and non-tender with normal pitched bowel sounds.  MUSCULOSKELETAL: There are no major deformities or cyanosis. NEUROLOGIC: No focal weakness or paresthesias are detected. SKIN: There are no ulcers or rashes noted. PSYCHIATRIC: The patient has a normal affect.  DATA:   BILATERAL LOWER EXTREMITY VENOUS DUPLEX: I have independently interpreted her bilateral lower extremity venous duplex.  On the right side there is no evidence of DVT or superficial thrombophlebitis. There is reflux in the deep system involving the common femoral vein. There is also reflux in the right great saphenous vein in the thigh.  On the left side, there is no evidence of DVT or superficial thrombophlebitis. There is reflux in the deep system involving the common femoral vein. There is also reflux in the left great saphenous vein.  MEDICAL ISSUES:  CHRONIC VENOUS INSUFFICIENCY: This patient has deep venous reflux and superficial venous reflux bilaterally. She has clinical class IV venous insufficiency with hyperpigmentation and also lipoma dermatosclerosis. I have encouraged her to elevate her lower extremity is when she gets home at night and we have discussed the proper positioning for this. I have encouraged her to wear her new compression stockings while she is at work. She should try to avoid prolonged sitting and standing and exercise and ambulate as much as possible. If the knee-high stockings do not help with her symptoms then I have written her a prescription for thigh-high stockings with a gradient of 20-30 mmHg. If this is not successful then she could potentially be considered for laser ablation of the right great  saphenous vein. She will call if her symptoms do not improve.  Deitra Mayo [facility] [phone]

## 2017-04-09 ENCOUNTER — Other Ambulatory Visit: Payer: Self-pay | Admitting: *Deleted

## 2017-04-09 DIAGNOSIS — I10 Essential (primary) hypertension: Secondary | ICD-10-CM

## 2017-04-10 MED ORDER — HYDROCHLOROTHIAZIDE 25 MG PO TABS: 25.0000 mg | ORAL_TABLET | Freq: Every day | ORAL | 4 refills | Status: DC

## 2017-04-11 ENCOUNTER — Encounter: Payer: BLUE CROSS/BLUE SHIELD | Admitting: Internal Medicine

## 2017-05-31 ENCOUNTER — Ambulatory Visit (INDEPENDENT_AMBULATORY_CARE_PROVIDER_SITE_OTHER): Payer: BLUE CROSS/BLUE SHIELD | Admitting: Internal Medicine

## 2017-05-31 ENCOUNTER — Encounter: Payer: Self-pay | Admitting: Internal Medicine

## 2017-05-31 ENCOUNTER — Other Ambulatory Visit: Payer: Self-pay

## 2017-05-31 VITALS — BP 132/88 | HR 85 | Temp 99.0°F | Ht 67.0 in | Wt 148.8 lb

## 2017-05-31 DIAGNOSIS — I10 Essential (primary) hypertension: Secondary | ICD-10-CM

## 2017-05-31 DIAGNOSIS — H538 Other visual disturbances: Secondary | ICD-10-CM | POA: Insufficient documentation

## 2017-05-31 DIAGNOSIS — Z23 Encounter for immunization: Secondary | ICD-10-CM

## 2017-05-31 DIAGNOSIS — I872 Venous insufficiency (chronic) (peripheral): Secondary | ICD-10-CM

## 2017-05-31 DIAGNOSIS — I878 Other specified disorders of veins: Secondary | ICD-10-CM | POA: Diagnosis not present

## 2017-05-31 MED ORDER — TRIAMCINOLONE ACETONIDE 0.1 % EX CREA: 1.0000 "application " | TOPICAL_CREAM | Freq: Two times a day (BID) | CUTANEOUS | 0 refills | Status: DC

## 2017-05-31 NOTE — Assessment & Plan Note (Signed)
Continue triamcolone cream and compression stockings. Follow up with vascular surgery as needed

## 2017-05-31 NOTE — Assessment & Plan Note (Signed)
Difficulty with vision at times  Will refer to ophthalmology for evaluation

## 2017-05-31 NOTE — Assessment & Plan Note (Signed)
Well controlled  Cont HCTZ

## 2017-05-31 NOTE — Progress Notes (Signed)
Karen Robertson is a 57 y.o. female presents to office today for annual physical exam examination.  Concerns today include:  1. CHRONIC HTN: Current Meds - HCTZ  Reports good compliance, took meds today. Tolerating well, w/o complaints. Denies CP, dyspnea, HA, edema, dizziness / lightheadedness  Last eye exam: Needs an appointment  Last dental exam: has not gone to the dentist in a couple of years Last colonoscopy: Will provide info  Last mammogram: uptodate. Last pap smear: Needs one later this years  Immunizations needed: tetanus booster  Refills needed today: Triamcolone Cream   Women's Health  Periods: Had not menstrual period many years  Contraception: NA Pelvic symptoms: No Sexual activity: No STD Screening: No Pap smear status: Needs one in October  Exercise: twice a week  Diet: Eats plenty of veggies and fruit  Smoking: None Alcohol: None  Drugs: None  Mood: None    Past Medical History:  Diagnosis Date  . Hypertension   . Venous insufficiency    Social History   Socioeconomic History  . Marital status: Single    Spouse name: Not on file  . Number of children: Not on file  . Years of education: Not on file  . Highest education level: Not on file  Occupational History  . Not on file  Social Needs  . Financial resource strain: Not on file  . Food insecurity:    Worry: Not on file    Inability: Not on file  . Transportation needs:    Medical: Not on file    Non-medical: Not on file  Tobacco Use  . Smoking status: Never Smoker  . Smokeless tobacco: Never Used  Substance and Sexual Activity  . Alcohol use: Yes  . Drug use: No  . Sexual activity: Not on file  Lifestyle  . Physical activity:    Days per week: Not on file    Minutes per session: Not on file  . Stress: Not on file  Relationships  . Social connections:    Talks on phone: Not on file    Gets together: Not on file    Attends religious service: Not on file    Active member of club or  organization: Not on file    Attends meetings of clubs or organizations: Not on file    Relationship status: Not on file  . Intimate partner violence:    Fear of current or ex partner: Not on file    Emotionally abused: Not on file    Physically abused: Not on file    Forced sexual activity: Not on file  Other Topics Concern  . 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 Family History  Problem Relation Age of Onset  . Diabetes Mother   . Hypertension Mother     ROS: Review of Systems Patient reports no  vision/ hearing changes,anorexia, weight change, fever ,adenopathy, persistant / recurrent hoarseness, swallowing issues, chest pain, edema,persistant / recurrent cough, hemoptysis, dyspnea(rest, exertional, paroxysmal nocturnal), gastrointestinal  bleeding (melena, rectal bleeding), abdominal pain, excessive heart burn, GU symptoms(dysuria, hematuria, pyuria, voiding/incontinence  Issues) syncope, focal weakness, severe memory loss, concerning skin lesions, depression, anxiety, abnormal bruising/bleeding, major joint  swelling, breast masses or abnormal vaginal bleeding.    Physical exam Physical Exam  Constitutional: She is oriented to person, place, and time. She appears well-developed and well-nourished.  HENT:  Head: Normocephalic and atraumatic.  Eyes: Pupils are equal, round, and reactive to light. EOM are normal.  Neck: Normal range of motion. Neck supple.  Cardiovascular: Normal rate and regular rhythm.  Pulmonary/Chest: Effort normal and breath sounds normal.  Abdominal: Soft. Bowel sounds are normal.  Musculoskeletal: Normal range of motion.  Neurological: She is alert and oriented to person, place, and time.  Skin:  Notable for  venous stasis changes   Psychiatric: She has a normal mood and affect.    Assessment/ Plan: Patient with HTN here for annual physical exam.   Vision blurred Difficulty with vision at times  Will refer to ophthalmology for evaluation   ESSENTIAL HYPERTENSION, BENIGN Well controlled  Cont HCTZ  Venous (peripheral) insufficiency Continue triamcolone cream and compression stockings. Follow up with vascular surgery as needed     [facility] PGY-3, [place]

## 2017-05-31 NOTE — Patient Instructions (Signed)
I would recommend you follow up in about 3 months.

## 2018-02-23 NOTE — Progress Notes (Deleted)
   Subjective:  CC -- Annual Physical; With complaints of ***  Pt reports she ***    General Healthcare: Medication Compliance: {YES/NO/WILD VELFY:10175}  Cardiac Risk as of ***(date): *** (assessment every 3-5 years)*** Aspirin: {YES/NO/WILD CARDS:18581} (65yr CAD risk >3%)*** Dx Hypertension: yes  Dx Hyperlipidemia: {YES/NO/WILD ZWCHE:52778}  Diabetes: {YES/NO/WILD CARDS:18581} (screen until 56yo w/ BMI >25 or HTN/HLD)***(A1c >6.5, or classic Sxs w/ random CBG >200, or FBG >126 (fasting >8hr))*** Dx Obesity: {YES/NO/WILD CARDS:18581} (Class I BMI <34.9, Class II <39.9, Class III < 49.9)*** Weight Loss: {YES/NO/WILD CARDS:18581} (loss >10% needs further assessment of undernutrition/medical-cause/pharm-cause/food security)*** Physical Activity: {YES/NO/WILD EUMPN:36144}  Urinary Incontinence: {YES/NO/WILD RXVQM:08676} (yes=med review, GU exam, blood/urine labs)***  Social: Driving: {YES/NO/WILD PPJKD:32671} (vision, mobility, cognition >> refer for driving assessment PRN)*** Alcohol Use: {YES/NO/WILD IWPYK:99833}  Tobacco Use: {YES/NO/WILD ASNKN:39767}   - Interested in Quitting: {YES/NO/WILD HALPF:79024}  Other Drugs: {YES/NO/WILD CARDS:18581}  Independence: *** Depression: {YES/NO/WILD CARDS:18581}   - PHQ9 score:  Support and Life at Home: {YES/NO/WILD CARDS:18581} (signs of neglect?)*** Advanced Directives: {YES/NO/WILD OXBDZ:32992}   Cancer: (Factor life expectancy & benefits vs harms)*** Colorectal >> Colonoscopy: {YES/NO/WILD EQAST:41962}  Lung >> Tobacco Use: {YES/NO/WILD CARDS:18581} (screen until 56yo w/ >66yr/pack/yr Hx, unless quit >76yr ago) ***  - If so, previous Low-Dose CT screen: {YES/NO/WILD IWLNL:89211}  Breast >> Mammogram: {YES/NO/WILD CARDS:18581} (to be discussed; Q78yrs in life-expect >37yr)*** Cervical/Endometrial >>  - Postmenopausal: {YES/NO/WILD HERDE:08144} - Hysterectomy: {YES/NO/WILD YJEHU:31497}  - Vaginal Bleeding: {YES/NO/WILD WYOVZ:85885} - Pap  Smear: {YES/NO/WILD OYDXA:12878}   - Previous Abnormal Pap: {YES/NO/WILD CARDS:18581} (DC when >65yo w/ 3 consecutive normals)*** Skin >> Suspicious lesions: {YES/NO/WILD MVEHM:09470}   Other: Osteoporosis: {YES/NO/WILD CARDS:18581} (All women >65yo -- do BMD screen)*** Zoster Vaccine: {YES/NO/WILD CARDS:18581} (those >50yo, once)*** Flu Vaccine: {YES/NO/WILD JGGEZ:66294}  Pneumonia Vaccine: {YES/NO/WILD TMLYY:50354} (PPSV13 then PPSV23 in 6-12mths; repeat PPSV23 once if pt received prior to 58yo and 67yrs have passed)***   ROS-  Past Medical History Patient Active Problem List   Diagnosis Date Noted  . Vision blurred 05/31/2017  . Screening for viral disease 03/30/2016  . Health care maintenance 11/24/2014  . Poor dentition 08/20/2012  . Rash 04/11/2010  . HYPOKALEMIA 03/06/2010  . OVERWEIGHT 11/29/2008  . ESSENTIAL HYPERTENSION, BENIGN 06/15/2008  . Venous (peripheral) insufficiency 05/14/2008    Medications- reviewed and updated Current Outpatient Medications  Medication Sig Dispense Refill  . aspirin (ASPIR-LOW) 81 MG EC tablet Take 81 mg by mouth daily.      . hydrochlorothiazide (HYDRODIURIL) 25 MG tablet Take 1 tablet (25 mg total) by mouth daily. 60 tablet 4  . triamcinolone cream (KENALOG) 0.1 % Apply 1 application topically 2 (two) times daily. Apply to left leg twice a day for itching due to venous stasis 80 g 0   No current facility-administered medications for this visit.     Objective: LMP 08/08/2014 (Exact Date)  Gen: NAD, alert, cooperative with exam*** HEENT: NCAT, EOMI, PERRL CV: RRR, good S1/S2, no murmur Resp: CTABL, no wheezes, non-labored Abd: Soft, Non Tender, Non Distended, BS present, no guarding or organomegaly Genital Exam: {genitourinary exam:311642::"not done"} Ext: No edema, warm Neuro: Alert and oriented, No gross deficits   Assessment/Plan:  No problem-specific Assessment & Plan notes found for this encounter.   No orders of the  defined types were placed in this encounter.   No orders of the defined types were placed in this encounter.    Caroline More, DO, PGY-2 02/23/2018 1:34 PM

## 2018-02-28 ENCOUNTER — Ambulatory Visit: Payer: BLUE CROSS/BLUE SHIELD | Admitting: Family Medicine

## 2018-03-18 ENCOUNTER — Encounter (HOSPITAL_COMMUNITY): Payer: Self-pay | Admitting: Emergency Medicine

## 2018-03-18 ENCOUNTER — Ambulatory Visit (HOSPITAL_COMMUNITY)
Admission: EM | Admit: 2018-03-18 | Discharge: 2018-03-18 | Disposition: A | Discharge status: Home / Self Care | Payer: BLUE CROSS/BLUE SHIELD | Attending: Family Medicine | Admitting: Family Medicine

## 2018-03-18 DIAGNOSIS — J069 Acute upper respiratory infection, unspecified: Secondary | ICD-10-CM

## 2018-03-18 DIAGNOSIS — B9789 Other viral agents as the cause of diseases classified elsewhere: Secondary | ICD-10-CM | POA: Diagnosis not present

## 2018-03-18 DIAGNOSIS — R030 Elevated blood-pressure reading, without diagnosis of hypertension: Secondary | ICD-10-CM

## 2018-03-18 MED ORDER — CETIRIZINE HCL 10 MG PO CHEW: 10.0000 mg | CHEWABLE_TABLET | Freq: Every day | ORAL | 0 refills | Status: DC

## 2018-03-18 MED ORDER — BENZONATATE 100 MG PO CAPS: 100.0000 mg | ORAL_CAPSULE | Freq: Three times a day (TID) | ORAL | 0 refills | Status: DC

## 2018-03-18 MED ORDER — FLUTICASONE PROPIONATE 50 MCG/ACT NA SUSP: 2.0000 | Freq: Every day | NASAL | 0 refills | Status: DC

## 2018-03-18 NOTE — Discharge Instructions (Signed)
Get plenty of rest and push fluids Tessalon Perles prescribed for cough Zyrtec prescribed for nasal congestion, runny nose, and/or sore throat Flonase prescribed for nasal congestion and runny nose Use medications daily for symptom relief Use OTC medications like ibuprofen or tylenol as needed fever or pain Follow up with PCP if symptoms persist Return or go to ER if you have any new or worsening symptoms fever, chills, nausea, vomiting, chest pain, cough, shortness of breath, wheezing, abdominal pain, changes in bowel or bladder habits, etc...  Please continue to monitor blood pressure at home and keep a log Eat a well balanced diet of fruits, vegetables and lean meats.  Avoid foods high in fat and salt Drink water.  At least half your body weight in ounces Continue with prescribed medications for high blood pressure Please follow up with PCP this week for reevaluation on blood pressure and to ensure your medication is working.   Go to the ED if you have any new or worsening symptoms such as vision changes, fatigue, dizziness, chest pain, shortness of breath, nausea, swelling in your hands or feet, urinary symptoms, etc..Marland Kitchen

## 2018-03-18 NOTE — ED Triage Notes (Signed)
Pt presents to [facility] for assessment of hypertension over the weekend.  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

## 2018-03-18 NOTE — ED Provider Notes (Signed)
[facility]   [id number] 03/18/18 Arrival Time: 0913   CC: URI symptoms   SUBJECTIVE: History from: patient. Pt is a poor historian.    Karen Robertson is a 58 y.o. female who presents with nasal congestion, runny nose, cough, and fatigue x 2-3 days.  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  Has tried OTC medications without relief.  Denies aggravating factors.  Complains of subjective fever, sinus pain/ pressure.  Denies chills, sore throat, SOB, wheezing, chest pain, nausea, changes in bowel or bladder habits.    Received flu shot this year: no.  Patient also mentions elevated blood pressure at work. States hasn't been feeling well, and has had URI symptoms.  Unsure how high her blood pressure was at work.  124/70 in office today.  Denies hx of HTN, but upon review PT has a dx of HTN and is on hydrodiuril.  Admits to RN that she takes this medication daily.  Hx also significant for venous insufficiency.  Denies HA, vision changes, dizziness, lightheadedness, chest pain, shortness of breath, numbness or tingling in extremities, abdominal pain, changes in bowel or bladder habits.   ROS: As per HPI.  Past Medical History:  Diagnosis Date  . Hypertension   . Venous insufficiency    History reviewed. No pertinent surgical history. No Known Allergies No current facility-administered medications on file prior to encounter.    Current Outpatient Medications on File Prior to Encounter  Medication Sig Dispense Refill  . aspirin (ASPIR-LOW) 81 MG EC tablet Take 81 mg by mouth daily.      . hydrochlorothiazide (HYDRODIURIL) 25 MG tablet Take 1 tablet (25 mg total) by mouth daily. 60 tablet 4  . triamcinolone cream (KENALOG) 0.1 % Apply 1 application topically 2 (two) times daily. Apply to left leg twice a day for itching due to venous stasis 80 g 0   Social History   Socioeconomic History  . Marital status: Single    Spouse name: Not on file  . Number of  children: Not on file  . Years of education: Not on file  . Highest education level: Not on file  Occupational History  . Not on file  Social Needs  . Financial resource strain: Not on file  . Food insecurity:    Worry: Not on file    Inability: Not on file  . Transportation needs:    Medical: Not on file    Non-medical: Not on file  Tobacco Use  . Smoking status: Never Smoker  . Smokeless tobacco: Never Used  Substance and Sexual Activity  . Alcohol use: Yes  . Drug use: No  . Sexual activity: Not on file  Lifestyle  . Physical activity:    Days per week: Not on file    Minutes per session: Not on file  . Stress: Not on file  Relationships  . Social connections:    Talks on phone: Not on file    Gets together: Not on file    Attends religious service: Not on file    Active member of club or organization: Not on file    Attends meetings of clubs or organizations: Not on file    Relationship status: Not on file  . Intimate partner violence:    Fear of current or ex partner: Not on file    Emotionally abused: Not on file    Physically abused: Not on file    Forced sexual activity: Not on file  Other Topics Concern  . Not on file  Social History Narrative   Lives with 3 daughters in 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 Diabetes Mother   . Hypertension Mother     OBJECTIVE:  Vitals:   03/18/18 1015  BP: 124/70  Pulse: (!) 101  Resp: 16  Temp: 98.1 F (36.7 C)  TempSrc: Oral  SpO2: 100%     General appearance: AOx3; appears fatigued, but nontoxic; speaking in full sentences and tolerating own secretions HEENT: NCAT; Ears: EACs clear, TMs pearly gray; Eyes: PERRL.  EOM grossly intact. Nose: nares patent with mild rhinorrhea, Throat: oropharynx clear, tonsils non  erythematous or enlarged, uvula midline  Neck: supple without LAD Lungs: unlabored respirations, symmetrical air entry; cough: absent; no respiratory distress; CTAB Heart: regular rate and rhythm.  Radial pulses 2+ symmetrical bilaterally Skin: warm and dry Psychological: alert and cooperative; normal mood and affect; difficulty answer questions related to HPI but alert and oriented to person, place, and recent upcoming holiday.    ASSESSMENT & PLAN:  1. Viral URI with cough   2. Elevated blood pressure reading     Meds ordered this encounter  Medications  . cetirizine (ZYRTEC) 10 MG chewable tablet    Sig: Chew 1 tablet (10 mg total) by mouth daily.    Dispense:  20 tablet    Refill:  0    Order Specific Question:   Supervising Provider    Answer:   Raylene Everts [[id number]]  . fluticasone (FLONASE) 50 MCG/ACT nasal spray    Sig: Place 2 sprays into both nostrils daily.    Dispense:  16 g    Refill:  0    Order Specific Question:   Supervising Provider    Answer:   Raylene Everts [[id number]]  . benzonatate (TESSALON) 100 MG capsule    Sig: Take 1 capsule (100 mg total) by mouth every 8 (eight) hours.    Dispense:  21 capsule    Refill:  0    Order Specific Question:   Supervising Provider    Answer:   Raylene Everts [[id number]]    Get plenty of rest and push fluids Tessalon Perles prescribed for cough Zyrtec prescribed for nasal congestion, runny nose, and/or sore throat Flonase prescribed for nasal congestion and runny nose Use medications daily for symptom relief Use OTC medications like ibuprofen or tylenol as needed fever or pain Follow up with PCP if symptoms persist Return or go to ER if you have any new or worsening symptoms fever, chills, nausea, vomiting, chest pain, cough, shortness of breath, wheezing, abdominal pain, changes in bowel or bladder habits, etc...  Please continue to monitor blood pressure at home and keep a log Eat a well balanced diet of  fruits, vegetables and lean meats.  Avoid foods high in fat and salt Drink water.  At least half your body weight in ounces Continue with prescribed medications for high blood pressure Please follow up with PCP this week for reevaluation on blood pressure and to ensure your medication is working.   Go to the ED if you have any new or worsening symptoms such as vision changes, fatigue, dizziness, chest pain, shortness of breath, nausea, swelling in your  hands or feet, urinary symptoms, etc...  Reviewed expectations re: course of current medical issues. Questions answered. Outlined signs and symptoms indicating need for more acute intervention. Patient verbalized understanding. After Visit Summary given.         Lestine Box, PA-C 03/18/18 1544

## 2018-04-17 ENCOUNTER — Other Ambulatory Visit: Payer: Self-pay

## 2018-04-17 DIAGNOSIS — I10 Essential (primary) hypertension: Secondary | ICD-10-CM

## 2018-04-17 MED ORDER — HYDROCHLOROTHIAZIDE 25 MG PO TABS: 25.0000 mg | ORAL_TABLET | Freq: Every day | ORAL | 0 refills | Status: DC

## 2018-04-17 NOTE — Telephone Encounter (Signed)
Covering for Dr. Tammi Klippel, will refill until patient's appointment 05/02/2018

## 2018-05-02 ENCOUNTER — Ambulatory Visit: Payer: BLUE CROSS/BLUE SHIELD | Admitting: Family Medicine

## 2018-05-12 ENCOUNTER — Other Ambulatory Visit: Payer: Self-pay | Admitting: Family Medicine

## 2018-05-12 DIAGNOSIS — I10 Essential (primary) hypertension: Secondary | ICD-10-CM

## 2018-06-07 ENCOUNTER — Other Ambulatory Visit: Payer: Self-pay | Admitting: Family Medicine

## 2018-06-07 DIAGNOSIS — I10 Essential (primary) hypertension: Secondary | ICD-10-CM

## 2018-07-21 NOTE — Progress Notes (Signed)
Subjective:   Karen Robertson is a 58 y.o. female with a history of HTN and venous insufficiency here for an annual gynecological exam. Pap smear is UTD    Gyn concerns/Preventative healthcare  Last menstrual period: Patient's last menstrual period was 08/08/2014 (exact date).  Regular periods: post menopausal   Heavy bleeding: no  Sexually active: no  Contraception or hormonal therapy: abstince, post menapausal   Hx of STD: Patient does not desire STD screening  Dyspareunia: No  Hot flashes: No  Vaginal discharge: no  Dysuria:No   Last mammogram: 11/2015, no evidence of malignancy. Recommended screening in 1 year  Breast mass or concerns: No  Last pap smear: 11/2014, neg. No HPV     PMH, Surgical Hx, Family Hx, Social History reviewed and updated as below.  Past Medical History:  Diagnosis Date  . Hypertension   . Venous insufficiency    History reviewed. No pertinent surgical history. Family History  Problem Relation Age of Onset  . Diabetes Mother   . Hypertension Mother    Social History   Socioeconomic History  . Marital status: Single    Spouse name: Not on file  . Number of children: Not on file  . Years of education: Not on file  . Highest education level: Not on file  Occupational History  . Not on file  Social Needs  . Financial resource strain: Not on file  . Food insecurity    Worry: Not on file    Inability: Not on file  . Transportation needs    Medical: Not on file    Non-medical: Not on file  Tobacco Use  . Smoking status: Never Smoker  . Smokeless tobacco: Never Used  Substance and Sexual Activity  . Alcohol use: Not Currently  . Drug use: No  . Sexual activity: Not Currently  Lifestyle  . Physical activity    Days per week: Not on file    Minutes per session: Not on file  . Stress: Not on file  Relationships  . Social Herbalist on phone: Not on file    Gets together: Not on file    Attends religious  service: Not on file    Active member of club or organization: Not on file    Attends meetings of clubs or organizations: Not on file    Relationship status: Not on file  Other Topics Concern  . 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 Otherwise a complete 10 point ROS was negative.    Objective:   Vitals:   07/22/18 1540  BP: (!) 146/80  Pulse: 79  SpO2: 98%   Exam: General: well appearing, NAD. HEENT: NCAT. Cardiovascular: RRR. No murmurs, rubs, or gallops. Respiratory: CTAB. No rales, rhonchi, or wheeze. Abdomen: soft, nontender, nondistended. Extremities: warm, well perfused. No LE edema. 5/5 muscle strength in all extremities. Normal grip strength  Skin: Warm, dry, intact. Neuro: No focal deficits.  Pelvic Exam:        External: normal female genitalia without lesions or masses        Vagina: normal without lesions or masses  Cervix: normal without lesions or masses        Pap smear: performed        Samples for Wet prep, GC/Chlamydia obtained    Chemistry      Component Value Date/Time   NA 146 (H) 07/22/2018 1552   K 3.7 07/22/2018 1552   CL 102 07/22/2018 1552   CO2 26 07/22/2018 1552   BUN 9 07/22/2018 1552   CREATININE 0.76 07/22/2018 1552   CREATININE 0.71 03/30/2016 1700      Component Value Date/Time   CALCIUM 9.7 07/22/2018 1552   ALKPHOS 45 03/30/2016 1700   AST 26 03/30/2016 1700   ALT 14 03/30/2016 1700   BILITOT 0.8 03/30/2016 1700      Lab Results  Component Value Date   WBC 3.8 03/30/2016   HGB 12.1 03/30/2016   HCT 35.3 03/30/2016   MCV 94.6 03/30/2016   PLT 200 03/30/2016   Lab Results  Component Value Date   TSH 0.99 03/30/2016   Lab Results  Component Value Date   HGBA1C 5.3  08/20/2012   Assessment/Plan:   Well woman exam Up-to-date on Pap smears.  Is not sexually active and does not do her STD testing at this time.  Order for mammogram and colonoscopy placed.  Patient is aware to schedule his appointments.  EMP ordered as it has been sometime since she has gotten lab work.  Follow-up in 1 year.    Return in about 1 year (around 07/22/2019).   Caroline More, DO, PGY-2

## 2018-07-22 ENCOUNTER — Ambulatory Visit (INDEPENDENT_AMBULATORY_CARE_PROVIDER_SITE_OTHER): Payer: BC Managed Care – PPO | Admitting: Family Medicine

## 2018-07-22 ENCOUNTER — Encounter: Payer: Self-pay | Admitting: Family Medicine

## 2018-07-22 ENCOUNTER — Other Ambulatory Visit: Payer: Self-pay

## 2018-07-22 VITALS — BP 146/80 | HR 79

## 2018-07-22 DIAGNOSIS — Z1239 Encounter for other screening for malignant neoplasm of breast: Secondary | ICD-10-CM

## 2018-07-22 DIAGNOSIS — I878 Other specified disorders of veins: Secondary | ICD-10-CM

## 2018-07-22 DIAGNOSIS — Z1211 Encounter for screening for malignant neoplasm of colon: Secondary | ICD-10-CM

## 2018-07-22 DIAGNOSIS — I10 Essential (primary) hypertension: Secondary | ICD-10-CM

## 2018-07-22 DIAGNOSIS — Z01419 Encounter for gynecological examination (general) (routine) without abnormal findings: Secondary | ICD-10-CM

## 2018-07-22 MED ORDER — TRIAMCINOLONE ACETONIDE 0.1 % EX CREA: 1.0000 "application " | TOPICAL_CREAM | Freq: Two times a day (BID) | CUTANEOUS | 0 refills | Status: DC

## 2018-07-22 NOTE — Patient Instructions (Signed)
Healthy Eating Following a healthy eating pattern may help you to achieve and maintain a healthy body weight, reduce the risk of chronic disease, and live a long and productive life. It is important to follow a healthy eating pattern at an appropriate calorie level for your body. Your nutritional needs should be met primarily through food by choosing a variety of nutrient-rich foods. What are tips for following this plan? Reading food labels  Read labels and choose the following: ? Reduced or low sodium. ? Juices with 100% fruit juice. ? Foods with low saturated fats and high polyunsaturated and monounsaturated fats. ? Foods with whole grains, such as whole wheat, cracked wheat, brown rice, and wild rice. ? Whole grains that are fortified with folic acid. This is recommended for women who are pregnant or who want to become pregnant.  Read labels and avoid the following: ? Foods with a lot of added sugars. These include foods that contain brown sugar, corn sweetener, corn syrup, dextrose, fructose, glucose, high-fructose corn syrup, honey, invert sugar, lactose, malt syrup, maltose, molasses, raw sugar, sucrose, trehalose, or turbinado sugar.  Do not eat more than the following amounts of added sugar per day:  6 teaspoons (25 g) for women.  9 teaspoons (38 g) for men. ? Foods that contain processed or refined starches and grains. ? Refined grain products, such as white flour, degermed cornmeal, white bread, and white rice. Shopping  Choose nutrient-rich snacks, such as vegetables, whole fruits, and nuts. Avoid high-calorie and high-sugar snacks, such as potato chips, fruit snacks, and candy.  Use oil-based dressings and spreads on foods instead of solid fats such as butter, stick margarine, or cream cheese.  Limit pre-made sauces, mixes, and "instant" products such as flavored rice, instant noodles, and ready-made pasta.  Try more plant-protein sources, such as tofu, tempeh, black beans,  edamame, lentils, nuts, and seeds.  Explore eating plans such as the Mediterranean diet or vegetarian diet. Cooking  Use oil to saut or stir-fry foods instead of solid fats such as butter, stick margarine, or lard.  Try baking, boiling, grilling, or broiling instead of frying.  Remove the fatty part of meats before cooking.  Steam vegetables in water or broth. Meal planning   At meals, imagine dividing your plate into fourths: ? One-half of your plate is fruits and vegetables. ? One-fourth of your plate is whole grains. ? One-fourth of your plate is protein, especially lean meats, poultry, eggs, tofu, beans, or nuts.  Include low-fat dairy as part of your daily diet. Lifestyle  Choose healthy options in all settings, including home, work, school, restaurants, or stores.  Prepare your food safely: ? Wash your hands after handling raw meats. ? Keep food preparation surfaces clean by regularly washing with hot, soapy water. ? Keep raw meats separate from ready-to-eat foods, such as fruits and vegetables. ? Cook seafood, meat, poultry, and eggs to the recommended internal temperature. ? Store foods at safe temperatures. In general:  Keep cold foods at 59F (4.4C) or below.  Keep hot foods at 159F (60C) or above.  Keep your freezer at [facility] (-17.8C) or below.  Foods are no longer safe to eat when they have been between the temperatures of 40-159F (4.4-60C) for more than 2 hours. What foods should I eat? Fruits Aim to eat 2 cup-equivalents of fresh, canned (in natural juice), or frozen fruits each day. Examples of 1 cup-equivalent of fruit include 1 small apple, 8 large strawberries, 1 cup canned fruit,  cup  dried fruit, or 1 cup 100% juice. Vegetables Aim to eat 2-3 cup-equivalents of fresh and frozen vegetables each day, including different varieties and colors. Examples of 1 cup-equivalent of vegetables include 2 medium carrots, 2 cups raw, leafy greens, 1 cup chopped  vegetable (raw or cooked), or 1 medium baked potato. Grains Aim to eat 6 ounce-equivalents of whole grains each day. Examples of 1 ounce-equivalent of grains include 1 slice of bread, 1 cup ready-to-eat cereal, 3 cups popcorn, or  cup cooked rice, pasta, or cereal. Meats and other proteins Aim to eat 5-6 ounce-equivalents of protein each day. Examples of 1 ounce-equivalent of protein include 1 egg, 1/2 cup nuts or seeds, or 1 tablespoon (16 g) peanut butter. A cut of meat or fish that is the size of a deck of cards is about 3-4 ounce-equivalents.  Of the protein you eat each week, try to have at least 8 ounces come from seafood. This includes salmon, trout, herring, and anchovies. Dairy Aim to eat 3 cup-equivalents of fat-free or low-fat dairy each day. Examples of 1 cup-equivalent of dairy include 1 cup (240 mL) milk, 8 ounces (250 g) yogurt, 1 ounces (44 g) natural cheese, or 1 cup (240 mL) fortified soy milk. Fats and oils  Aim for about 5 teaspoons (21 g) per day. Choose monounsaturated fats, such as canola and olive oils, avocados, peanut butter, and most nuts, or polyunsaturated fats, such as sunflower, corn, and soybean oils, walnuts, pine nuts, sesame seeds, sunflower seeds, and flaxseed. Beverages  Aim for six 8-oz glasses of water per day. Limit coffee to three to five 8-oz cups per day.  Limit caffeinated beverages that have added calories, such as soda and energy drinks.  Limit alcohol intake to no more than 1 drink a day for nonpregnant women and 2 drinks a day for men. One drink equals 12 oz of beer (355 mL), 5 oz of wine (148 mL), or 1 oz of hard liquor (44 mL). Seasoning and other foods  Avoid adding excess amounts of salt to your foods. Try flavoring foods with herbs and spices instead of salt.  Avoid adding sugar to foods.  Try using oil-based dressings, sauces, and spreads instead of solid fats. This information is based on general [place] nutrition guidelines. For more  information, visit Build[employer].es. Exact amounts may vary based on your nutrition needs. Summary  A healthy eating plan may help you to maintain a healthy weight, reduce the risk of chronic diseases, and stay active throughout your life.  Plan your meals. Make sure you eat the right portions of a variety of nutrient-rich foods.  Try baking, boiling, grilling, or broiling instead of frying.  Choose healthy options in all settings, including home, work, school, restaurants, or stores. This information is not intended to replace advice given to you by your health care provider. Make sure you discuss any questions you have with your health care provider. Document Released: 05/06/2017 Document Revised: 05/06/2017 Document Reviewed: 05/06/2017 Elsevier Interactive Patient Education  2019 [employer].

## 2018-07-23 LAB — BASIC METABOLIC PANEL
BUN/Creatinine Ratio: 12 (ref 9–23)
BUN: 9 mg/dL (ref 6–24)
CO2: 26 mmol/L (ref 20–29)
Calcium: 9.7 mg/dL (ref 8.7–10.2)
Chloride: 102 mmol/L (ref 96–106)
Creatinine, Ser: 0.76 mg/dL (ref 0.57–1.00)
GFR calc Af Amer: 102 mL/min/{1.73_m2} (ref >59)
GFR calc non Af Amer: 89 mL/min/{1.73_m2} (ref >59)
Glucose: 87 mg/dL (ref 65–99)
Potassium: 3.7 mmol/L (ref 3.5–5.2)
Sodium: 146 mmol/L — ABNORMAL HIGH (ref 134–144)

## 2018-07-24 ENCOUNTER — Encounter: Payer: Self-pay | Admitting: Family Medicine

## 2018-07-24 NOTE — Assessment & Plan Note (Signed)
Up-to-date on Pap smears.  Is not sexually active and does not do her STD testing at this time.  Order for mammogram and colonoscopy placed.  Patient is aware to schedule his appointments.  EMP ordered as it has been sometime since she has gotten lab work.  Follow-up in 1 year.

## 2018-07-28 ENCOUNTER — Telehealth: Payer: Self-pay

## 2018-07-28 NOTE — Telephone Encounter (Signed)
Attempted to call patient with results of bloodwork but there was no answer and no voicemail.  Will try later.  Ozella Almond, [place]

## 2018-08-05 ENCOUNTER — Encounter: Payer: Self-pay | Admitting: Gastroenterology

## 2018-09-05 ENCOUNTER — Ambulatory Visit (AMBULATORY_SURGERY_CENTER): Payer: Self-pay

## 2018-09-05 ENCOUNTER — Other Ambulatory Visit: Payer: Self-pay

## 2018-09-05 VITALS — Ht 67.0 in | Wt 170.0 lb

## 2018-09-05 DIAGNOSIS — Z1211 Encounter for screening for malignant neoplasm of colon: Secondary | ICD-10-CM

## 2018-09-05 MED ORDER — NA SULFATE-K SULFATE-MG SULF 17.5-3.13-1.6 GM/177ML PO SOLN: 1.0000 | Freq: Once | ORAL | 0 refills | Status: AC

## 2018-09-05 NOTE — Progress Notes (Signed)
Denies allergies to eggs or soy products. Denies complication of anesthesia or sedation. Denies use of weight loss medication. Denies use of O2.   Emmi instructions given for colonoscopy.  Pre-Visit was conducted by phone due to Covid 19.  Instructions were reviewed and mailed to patients home confirmed home address. A 15.00 coupon for Suprep was given to the patient. Patient was encouraged to call if she had any questions regarding instructions.   Extra time was spent with the patient trying to teach her instructions. I am not sure if she plans to follow through with the procedure, even though it was explained to her the importance of having a screening colonoscopy. Patient did not appear to be listening to all I was trying to tell her. I made every effort I could to make her understand the instructions. Hopefully she will call if she has questions.

## 2018-09-17 ENCOUNTER — Telehealth: Payer: Self-pay | Admitting: Gastroenterology

## 2018-09-17 NOTE — Telephone Encounter (Signed)
Pt states that copay for suprep is too expensive, she is asking if there is another alternative that is more affordable.

## 2018-09-17 NOTE — Telephone Encounter (Signed)
Spoke with pt. She states the Suprep will cost over $100.00 and she was requesting something cheaper. Informed pt we will give her new instructions for Miralax bowel prep and she needs to come to the office on the 3rd floor today to pick up her instructions. Her procedure is scheduled for this Friday.Pt understood. Gwyndolyn Saxon in [facility]

## 2018-09-18 ENCOUNTER — Telehealth: Payer: Self-pay | Admitting: Gastroenterology

## 2018-09-18 NOTE — Telephone Encounter (Signed)

## 2018-09-19 ENCOUNTER — Ambulatory Visit (AMBULATORY_SURGERY_CENTER): Payer: BC Managed Care – PPO | Admitting: Gastroenterology

## 2018-09-19 ENCOUNTER — Other Ambulatory Visit: Payer: Self-pay

## 2018-09-19 ENCOUNTER — Encounter: Payer: Self-pay | Admitting: Gastroenterology

## 2018-09-19 VITALS — BP 138/80 | HR 86 | Temp 99.1°F | Resp 16

## 2018-09-19 DIAGNOSIS — Z1211 Encounter for screening for malignant neoplasm of colon: Secondary | ICD-10-CM

## 2018-09-19 DIAGNOSIS — D120 Benign neoplasm of cecum: Secondary | ICD-10-CM

## 2018-09-19 MED ORDER — SODIUM CHLORIDE 0.9 % IV SOLN: 500.0000 mL | Freq: Once | INTRAVENOUS | Status: DC

## 2018-09-19 NOTE — Progress Notes (Signed)
A/ox3, pleased with MAC, report to RN 

## 2018-09-19 NOTE — Progress Notes (Signed)
Reviewed history with patient.  Patient very vague regarding her family history.  Her answers were delayed.

## 2018-09-19 NOTE — Patient Instructions (Signed)
Information on polyps and hemorrhoids given to you today.  Await pathology results.  YOU HAD AN ENDOSCOPIC PROCEDURE TODAY AT THE [facility]:   Refer to the procedure report that was given to you for any specific questions about what was found during the examination.  If the procedure report does not answer your questions, please call your gastroenterologist to clarify.  If you requested that your care partner not be given the details of your procedure findings, then the procedure report has been included in a sealed envelope for you to review at your convenience later.  YOU SHOULD EXPECT: Some feelings of bloating in the abdomen. Passage of more gas than usual.  Walking can help get rid of the air that was put into your GI tract during the procedure and reduce the bloating. If you had a lower endoscopy (such as a colonoscopy or flexible sigmoidoscopy) you may notice spotting of blood in your stool or on the toilet paper. If you underwent a bowel prep for your procedure, you may not have a normal bowel movement for a few days.  Please Note:  You might notice some irritation and congestion in your nose or some drainage.  This is from the oxygen used during your procedure.  There is no need for concern and it should clear up in a day or so.  SYMPTOMS TO REPORT IMMEDIATELY:   Following lower endoscopy (colonoscopy or flexible sigmoidoscopy):  Excessive amounts of blood in the stool  Significant tenderness or worsening of abdominal pains  Swelling of the abdomen that is new, acute  Fever of 100F or higher   For urgent or emergent issues, a gastroenterologist can be reached at any hour by calling [phone].   DIET:  We do recommend a small meal at first, but then you may proceed to your regular diet.  Drink plenty of fluids but you should avoid alcoholic beverages for 24 hours.  ACTIVITY:  You should plan to take it easy for the rest of today and you should NOT DRIVE or use  heavy machinery until tomorrow (because of the sedation medicines used during the test).    FOLLOW UP: Our staff will call the number listed on your records 48-72 hours following your procedure to check on you and address any questions or concerns that you may have regarding the information given to you following your procedure. If we do not reach you, we will leave a message.  We will attempt to reach you two times.  During this call, we will ask if you have developed any symptoms of COVID 19. If you develop any symptoms (ie: fever, flu-like symptoms, shortness of breath, cough etc.) before then, please call [phone].  If you test positive for Covid 19 in the 2 weeks post procedure, please call and report this information to us.    If any biopsies were taken you will be contacted by phone or by letter within the next 1-3 weeks.  Please call us at [phone] if you have not heard about the biopsies in 3 weeks.    SIGNATURES/CONFIDENTIALITY: You and/or your care partner have signed paperwork which will be entered into your electronic medical record.  These signatures attest to the fact that that the information above on your After Visit Summary has been reviewed and is understood.  Full responsibility of the confidentiality of this discharge information lies with you and/or your care-partner. 

## 2018-09-19 NOTE — Progress Notes (Signed)
Called to room to assist during endoscopic procedure.  Patient ID and intended procedure confirmed with present staff. Received instructions for my participation in the procedure from the performing physician.  

## 2018-09-19 NOTE — Op Note (Signed)
[facility] Patient Name: Karen Robertson Procedure Date: 09/19/2018 9:32 AM MRN: [id number] Endoscopist: Karen Robertson , MD Age: 58 Referring MD:  Date of Birth: 22-Jul-1960 Gender: Female Account #: [id number] Procedure:                Colonoscopy Indications:              Screening for colorectal malignant neoplasm, This                            is the patient's first colonoscopy Medicines:                Monitored Anesthesia Care Procedure:                Pre-Anesthesia Assessment:                           - Prior to the procedure, a History and Physical                            was performed, and patient medications and                            allergies were reviewed. The patient's tolerance of                            previous anesthesia was also reviewed. The risks                            and benefits of the procedure and the sedation                            options and risks were discussed with the patient.                            All questions were answered, and informed consent                            was obtained. Prior Anticoagulants: The patient has                            taken no previous anticoagulant or antiplatelet                            agents. ASA Grade Assessment: II - A patient with                            mild systemic disease. After reviewing the risks                            and benefits, the patient was deemed in                            satisfactory condition to undergo the procedure.  After obtaining informed consent, the colonoscope                            was passed under direct vision. Throughout the                            procedure, the patient's blood pressure, pulse, and                            oxygen saturations were monitored continuously. The                            Colonoscope was introduced through the anus and                            advanced to the the  cecum, identified by                            appendiceal orifice and ileocecal valve. The                            colonoscopy was performed without difficulty. The                            patient tolerated the procedure well. The quality                            of the bowel preparation was good. The ileocecal                            valve, appendiceal orifice, and rectum were                            photographed. Scope In: 9:41:00 AM Scope Out: 9:55:56 AM Scope Withdrawal Time: 0 hours 12 minutes 2 seconds  Total Procedure Duration: 0 hours 14 minutes 56 seconds  Findings:                 The perianal and digital rectal examinations were                            normal.                           A 3 mm polyp was found in the cecum. The polyp was                            sessile. The polyp was removed with a cold snare.                            Resection and retrieval were complete.                           Internal hemorrhoids were found during  retroflexion. The hemorrhoids were small.                           The exam was otherwise without abnormality. Complications:            No immediate complications. Estimated blood loss:                            Minimal. Estimated Blood Loss:     Estimated blood loss was minimal. Impression:               - One 3 mm polyp in the cecum, removed with a cold                            snare. Resected and retrieved.                           - Internal hemorrhoids.                           - The examination was otherwise normal. Recommendation:           - Patient has a contact number available for                            emergencies. The signs and symptoms of potential                            delayed complications were discussed with the                            patient. Return to normal activities tomorrow.                            Written discharge instructions were provided to the                             patient.                           - Resume previous diet.                           - Continue present medications.                           - Await pathology results. Karen Lipps P. Armbruster, MD 09/19/2018 10:01:39 AM This report has been signed electronically.

## 2018-09-23 ENCOUNTER — Telehealth: Payer: Self-pay | Admitting: *Deleted

## 2018-09-23 NOTE — Telephone Encounter (Signed)
First follow up call attempt.  No answer.

## 2018-09-26 ENCOUNTER — Encounter: Payer: Self-pay | Admitting: Gastroenterology

## 2019-03-31 ENCOUNTER — Other Ambulatory Visit: Payer: Self-pay

## 2019-03-31 ENCOUNTER — Ambulatory Visit (INDEPENDENT_AMBULATORY_CARE_PROVIDER_SITE_OTHER): Payer: BC Managed Care – PPO | Admitting: Family Medicine

## 2019-03-31 VITALS — HR 90 | Wt 152.6 lb

## 2019-03-31 DIAGNOSIS — I872 Venous insufficiency (chronic) (peripheral): Secondary | ICD-10-CM | POA: Diagnosis not present

## 2019-03-31 DIAGNOSIS — M7989 Other specified soft tissue disorders: Secondary | ICD-10-CM | POA: Diagnosis not present

## 2019-03-31 NOTE — Progress Notes (Signed)
    SUBJECTIVE:   CHIEF COMPLAINT / HPI:   L arm swelling L arm has been swollen for the last 1-2 years and worsened recently, extended into hand Has had some mild pain in L arm and R leg without alleviating or exacerbating factors Was told that she may have a cyst in her L arm by one of her coworkers Denies trauma to her left arm including recent falls No past surgeries Have ultrasound done several years ago which was negative.  Swelling has worsened since then.  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 Swelling is the same in the morning as in the evening No night pain  PERTINENT  PMH / PSH: Chronic venous insufficiency, essential hypertension  OBJECTIVE:   Pulse 90   Wt 152 lb 9.6 oz (69.2 kg)   LMP 08/08/2014 (Exact Date)   SpO2 100%   BMI 23.90 kg/m   General: well appearing, appears older than stated age, in NAD  Cardiac: RRR, no MRG, 1+ pitting edema and bilateral lower extremities Respiratory: CTAB, no rhonchi, rales, or wheezing, normal work of breathing Skin: Varicose veins visualized in bilateral lower extremities, no erythema of lower extremities L arm: No erythema or ecchymosis, diffusely swollen around the left wrist without visualization or palpation of a discrete mass, mildly tender to palpation    ASSESSMENT/PLAN:   Venous (peripheral) insufficiency No concern for DVT at this time.  Patient counseled that this is a chronic issue unfortunately that is best managed with properly fitting compression stockings and reduction of sodium intake.  She was also encouraged to elevate her legs at the end of the day.  Left arm swelling Unsure what the cause of her left wrist swelling would be.  Since it has been several years since examined, will obtain  ultrasound to further visualize the area.  X-ray unlikely to be helpful given lack of trauma history.     Karen Alu, MD [place]

## 2019-03-31 NOTE — Patient Instructions (Addendum)
It was nice meeting you today Karen Robertson!  We will get an ultrasound of the left arm to see what could be causing the swelling there.  Please continue compression stockings elevate your feet at the end of the day.  Please also work on reducing your salt intake.  The swelling in your legs will likely not completely get better, but it can be controlled by these things.  I will let you know what your ultrasound looks like once it returns.  If you have any questions or concerns, please feel free to call the clinic.   Be well,  Dr. Shan Levans  Chronic Venous Insufficiency Chronic venous insufficiency is a condition where the leg veins cannot effectively pump blood from the legs to the heart. This happens when the vein walls are either stretched, weakened, or damaged, or when the valves inside the vein are damaged. With the right treatment, you should be able to continue with an active life. This condition is also called venous stasis. What are the causes? Common causes of this condition include:  High blood pressure inside the veins (venous hypertension).  Sitting or standing too long, causing increased blood pressure in the leg veins.  A blood clot that blocks blood flow in a vein (deep vein thrombosis, DVT).  Inflammation of a vein (phlebitis) that causes a blood clot to form.  Tumors in the pelvis that cause blood to back up. What increases the risk? The following factors may make you more likely to develop this condition:  Having a family history of this condition.  Obesity.  Pregnancy.  Living without enough regular physical activity or exercise (sedentary lifestyle).  Smoking.  Having a job that requires long periods of standing or sitting in one place.  Being a certain age. Women in their 63s and 68s and men in their 19s are more likely to develop this condition. What are the signs or symptoms? Symptoms of this condition include:  Veins that are enlarged, bulging, or twisted  (varicose veins).  Skin breakdown or ulcers.  Reddened skin or dark discoloration of skin on the leg between the knee and ankle.  Brown, smooth, tight, and painful skin just above the ankle, usually on the inside of the leg (lipodermatosclerosis).  Swelling of the legs. How is this diagnosed? This condition may be diagnosed based on:  Your medical history.  A physical exam.  Tests, such as: ? A procedure that creates an image of a blood vessel and nearby organs and provides information about blood flow through the blood vessel (duplex ultrasound). ? A procedure that tests blood flow (plethysmography). ? A procedure that looks at the veins using X-ray and dye (venogram). How is this treated? The goals of treatment are to help you return to an active life and to minimize pain or disability. Treatment depends on the severity of your condition, and it may include:  Wearing compression stockings. These can help relieve symptoms and help prevent your condition from getting worse. However, they do not cure the condition.  Sclerotherapy. This procedure involves an injection of a solution that shrinks damaged veins.  Surgery. This may involve: ? Removing a diseased vein (vein stripping). ? Cutting off blood flow through the vein (laser ablation surgery). ? Repairing or reconstructing a valve within the affected vein. Follow these instructions at home:      Wear compression stockings as told by your health care provider. These stockings help to prevent blood clots and reduce swelling in your legs.  Take over-the-counter and prescription medicines only as told by your health care provider.  Stay active by exercising, walking, or doing different activities. Ask your health care provider what activities are safe for you and how much exercise you need.  Drink enough fluid to keep your urine pale yellow.  Do not use any products that contain nicotine or tobacco, such as cigarettes,  e-cigarettes, and chewing tobacco. If you need help quitting, ask your health care provider.  Keep all follow-up visits as told by your health care provider. This is important. Contact a health care provider if you:  Have redness, swelling, or more pain in the affected area.  See a red streak or line that goes up or down from the affected area.  Have skin breakdown or skin loss in the affected area, even if the breakdown is small.  Get an injury in the affected area. Get help right away if:  You get an injury and an open wound in the affected area.  You have: ? Severe pain that does not get better with medicine. ? Sudden numbness or weakness in the foot or ankle below the affected area. ? Trouble moving your foot or ankle. ? A fever. ? Worse or persistent symptoms. ? Chest pain. ? Shortness of breath. Summary  Chronic venous insufficiency is a condition where the leg veins cannot effectively pump blood from the legs to the heart.  Chronic venous insufficiency occurs when the vein walls become stretched, weakened, or damaged, or when valves within the vein are damaged.  Treatment depends on how severe your condition is. It often involves wearing compression stockings and may involve having a procedure.  Make sure you stay active by exercising, walking, or doing different activities. Ask your health care provider what activities are safe for you and how much exercise you need. This information is not intended to replace advice given to you by your health care provider. Make sure you discuss any questions you have with your health care provider. Document Revised: 10/15/2017 Document Reviewed: 10/15/2017 Elsevier Patient Education  [place].  Chronic Venous Insufficiency Chronic venous insufficiency is a condition where the leg veins cannot effectively pump blood from the legs to the heart. This happens when the vein walls are either stretched, weakened, or damaged, or  when the valves inside the vein are damaged. With the right treatment, you should be able to continue with an active life. This condition is also called venous stasis. What are the causes? Common causes of this condition include:  High blood pressure inside the veins (venous hypertension).  Sitting or standing too long, causing increased blood pressure in the leg veins.  A blood clot that blocks blood flow in a vein (deep vein thrombosis, DVT).  Inflammation of a vein (phlebitis) that causes a blood clot to form.  Tumors in the pelvis that cause blood to back up. What increases the risk? The following factors may make you more likely to develop this condition:  Having a family history of this condition.  Obesity.  Pregnancy.  Living without enough regular physical activity or exercise (sedentary lifestyle).  Smoking.  Having a job that requires long periods of standing or sitting in one place.  Being a certain age. Women in their 75s and 65s and men in their 78s are more likely to develop this condition. What are the signs or symptoms? Symptoms of this condition include:  Veins that are enlarged, bulging, or twisted (varicose veins).  Skin breakdown or  ulcers.  Reddened skin or dark discoloration of skin on the leg between the knee and ankle.  Brown, smooth, tight, and painful skin just above the ankle, usually on the inside of the leg (lipodermatosclerosis).  Swelling of the legs. How is this diagnosed? This condition may be diagnosed based on:  Your medical history.  A physical exam.  Tests, such as: ? A procedure that creates an image of a blood vessel and nearby organs and provides information about blood flow through the blood vessel (duplex ultrasound). ? A procedure that tests blood flow (plethysmography). ? A procedure that looks at the veins using X-ray and dye (venogram). How is this treated? The goals of treatment are to help you return to an active  life and to minimize pain or disability. Treatment depends on the severity of your condition, and it may include:  Wearing compression stockings. These can help relieve symptoms and help prevent your condition from getting worse. However, they do not cure the condition.  Sclerotherapy. This procedure involves an injection of a solution that shrinks damaged veins.  Surgery. This may involve: ? Removing a diseased vein (vein stripping). ? Cutting off blood flow through the vein (laser ablation surgery). ? Repairing or reconstructing a valve within the affected vein. Follow these instructions at home:      Wear compression stockings as told by your health care provider. These stockings help to prevent blood clots and reduce swelling in your legs.  Take over-the-counter and prescription medicines only as told by your health care provider.  Stay active by exercising, walking, or doing different activities. Ask your health care provider what activities are safe for you and how much exercise you need.  Drink enough fluid to keep your urine pale yellow.  Do not use any products that contain nicotine or tobacco, such as cigarettes, e-cigarettes, and chewing tobacco. If you need help quitting, ask your health care provider.  Keep all follow-up visits as told by your health care provider. This is important. Contact a health care provider if you:  Have redness, swelling, or more pain in the affected area.  See a red streak or line that goes up or down from the affected area.  Have skin breakdown or skin loss in the affected area, even if the breakdown is small.  Get an injury in the affected area. Get help right away if:  You get an injury and an open wound in the affected area.  You have: ? Severe pain that does not get better with medicine. ? Sudden numbness or weakness in the foot or ankle below the affected area. ? Trouble moving your foot or ankle. ? A fever. ? Worse or  persistent symptoms. ? Chest pain. ? Shortness of breath. Summary  Chronic venous insufficiency is a condition where the leg veins cannot effectively pump blood from the legs to the heart.  Chronic venous insufficiency occurs when the vein walls become stretched, weakened, or damaged, or when valves within the vein are damaged.  Treatment depends on how severe your condition is. It often involves wearing compression stockings and may involve having a procedure.  Make sure you stay active by exercising, walking, or doing different activities. Ask your health care provider what activities are safe for you and how much exercise you need. This information is not intended to replace advice given to you by your health care provider. Make sure you discuss any questions you have with your health care provider. Document Revised: 10/15/2017 Document  Reviewed: 10/15/2017 Elsevier Patient Education  [place].  Chronic Venous Insufficiency Chronic venous insufficiency is a condition where the leg veins cannot effectively pump blood from the legs to the heart. This happens when the vein walls are either stretched, weakened, or damaged, or when the valves inside the vein are damaged. With the right treatment, you should be able to continue with an active life. This condition is also called venous stasis. What are the causes? Common causes of this condition include:  High blood pressure inside the veins (venous hypertension).  Sitting or standing too long, causing increased blood pressure in the leg veins.  A blood clot that blocks blood flow in a vein (deep vein thrombosis, DVT).  Inflammation of a vein (phlebitis) that causes a blood clot to form.  Tumors in the pelvis that cause blood to back up. What increases the risk? The following factors may make you more likely to develop this condition:  Having a family history of this condition.  Obesity.  Pregnancy.  Living without enough  regular physical activity or exercise (sedentary lifestyle).  Smoking.  Having a job that requires long periods of standing or sitting in one place.  Being a certain age. Women in their 60s and 32s and men in their 33s are more likely to develop this condition. What are the signs or symptoms? Symptoms of this condition include:  Veins that are enlarged, bulging, or twisted (varicose veins).  Skin breakdown or ulcers.  Reddened skin or dark discoloration of skin on the leg between the knee and ankle.  Brown, smooth, tight, and painful skin just above the ankle, usually on the inside of the leg (lipodermatosclerosis).  Swelling of the legs. How is this diagnosed? This condition may be diagnosed based on:  Your medical history.  A physical exam.  Tests, such as: ? A procedure that creates an image of a blood vessel and nearby organs and provides information about blood flow through the blood vessel (duplex ultrasound). ? A procedure that tests blood flow (plethysmography). ? A procedure that looks at the veins using X-ray and dye (venogram). How is this treated? The goals of treatment are to help you return to an active life and to minimize pain or disability. Treatment depends on the severity of your condition, and it may include:  Wearing compression stockings. These can help relieve symptoms and help prevent your condition from getting worse. However, they do not cure the condition.  Sclerotherapy. This procedure involves an injection of a solution that shrinks damaged veins.  Surgery. This may involve: ? Removing a diseased vein (vein stripping). ? Cutting off blood flow through the vein (laser ablation surgery). ? Repairing or reconstructing a valve within the affected vein. Follow these instructions at home:      Wear compression stockings as told by your health care provider. These stockings help to prevent blood clots and reduce swelling in your legs.  Take  over-the-counter and prescription medicines only as told by your health care provider.  Stay active by exercising, walking, or doing different activities. Ask your health care provider what activities are safe for you and how much exercise you need.  Drink enough fluid to keep your urine pale yellow.  Do not use any products that contain nicotine or tobacco, such as cigarettes, e-cigarettes, and chewing tobacco. If you need help quitting, ask your health care provider.  Keep all follow-up visits as told by your health care provider. This is important. Contact a health  care provider if you:  Have redness, swelling, or more pain in the affected area.  See a red streak or line that goes up or down from the affected area.  Have skin breakdown or skin loss in the affected area, even if the breakdown is small.  Get an injury in the affected area. Get help right away if:  You get an injury and an open wound in the affected area.  You have: ? Severe pain that does not get better with medicine. ? Sudden numbness or weakness in the foot or ankle below the affected area. ? Trouble moving your foot or ankle. ? A fever. ? Worse or persistent symptoms. ? Chest pain. ? Shortness of breath. Summary  Chronic venous insufficiency is a condition where the leg veins cannot effectively pump blood from the legs to the heart.  Chronic venous insufficiency occurs when the vein walls become stretched, weakened, or damaged, or when valves within the vein are damaged.  Treatment depends on how severe your condition is. It often involves wearing compression stockings and may involve having a procedure.  Make sure you stay active by exercising, walking, or doing different activities. Ask your health care provider what activities are safe for you and how much exercise you need. This information is not intended to replace advice given to you by your health care provider. Make sure you discuss any questions  you have with your health care provider. Document Revised: 10/15/2017 Document Reviewed: 10/15/2017 Elsevier Patient Education  [place].

## 2019-03-31 NOTE — Assessment & Plan Note (Signed)
No concern for DVT at this time.  Patient counseled that this is a chronic issue unfortunately that is best managed with properly fitting compression stockings and reduction of sodium intake.  She was also encouraged to elevate her legs at the end of the day.

## 2019-03-31 NOTE — Assessment & Plan Note (Signed)
Unsure what the cause of her left wrist swelling would be.  Since it has been several years since examined, will obtain ultrasound to further visualize the area.  X-ray unlikely to be helpful given lack of trauma history.

## 2019-04-02 ENCOUNTER — Telehealth: Payer: Self-pay

## 2019-04-02 ENCOUNTER — Inpatient Hospital Stay: Admission: RE | Admit: 2019-04-02 | Payer: BC Managed Care – PPO | Source: Ambulatory Visit

## 2019-04-02 ENCOUNTER — Other Ambulatory Visit: Payer: Self-pay | Admitting: Family Medicine

## 2019-04-02 DIAGNOSIS — M7989 Other specified soft tissue disorders: Secondary | ICD-10-CM

## 2019-04-02 NOTE — Telephone Encounter (Signed)
That is understandable.  We will order a L forearm x-ray instead.  Page is planning to inform Ms. Colin of this change.

## 2019-04-02 NOTE — Telephone Encounter (Signed)
Noel Journey, from [place], calls nurse line stating they do not scan for [place] order placed on 2/23. Noel Journey stated, "unless you are looking for a DVT or palpable mass, we don't scan just the arm." Per note, there were no palpable masses and no concern for DVT. Unfortunately, Noel Journey will have to call patient to cancel.

## 2019-04-02 NOTE — Telephone Encounter (Signed)
Patient contacted and informed of Xray order.

## 2019-04-03 ENCOUNTER — Ambulatory Visit
Admission: RE | Admit: 2019-04-03 | Discharge: 2019-04-03 | Disposition: A | Discharge status: Home / Self Care | Payer: BC Managed Care – PPO | Source: Ambulatory Visit | Attending: Family Medicine | Admitting: Family Medicine

## 2019-04-03 DIAGNOSIS — M7989 Other specified soft tissue disorders: Secondary | ICD-10-CM | POA: Diagnosis not present

## 2019-04-03 IMAGING — DX DG FOREARM 2V*L*
2 series · 2 of 2 positions shown · non-contrast
Comparison: None.

CLINICAL DATA: Swollen left forearm

EXAM:
LEFT FOREARM - 2 VIEW

[dg forearm left (1 of 2)]
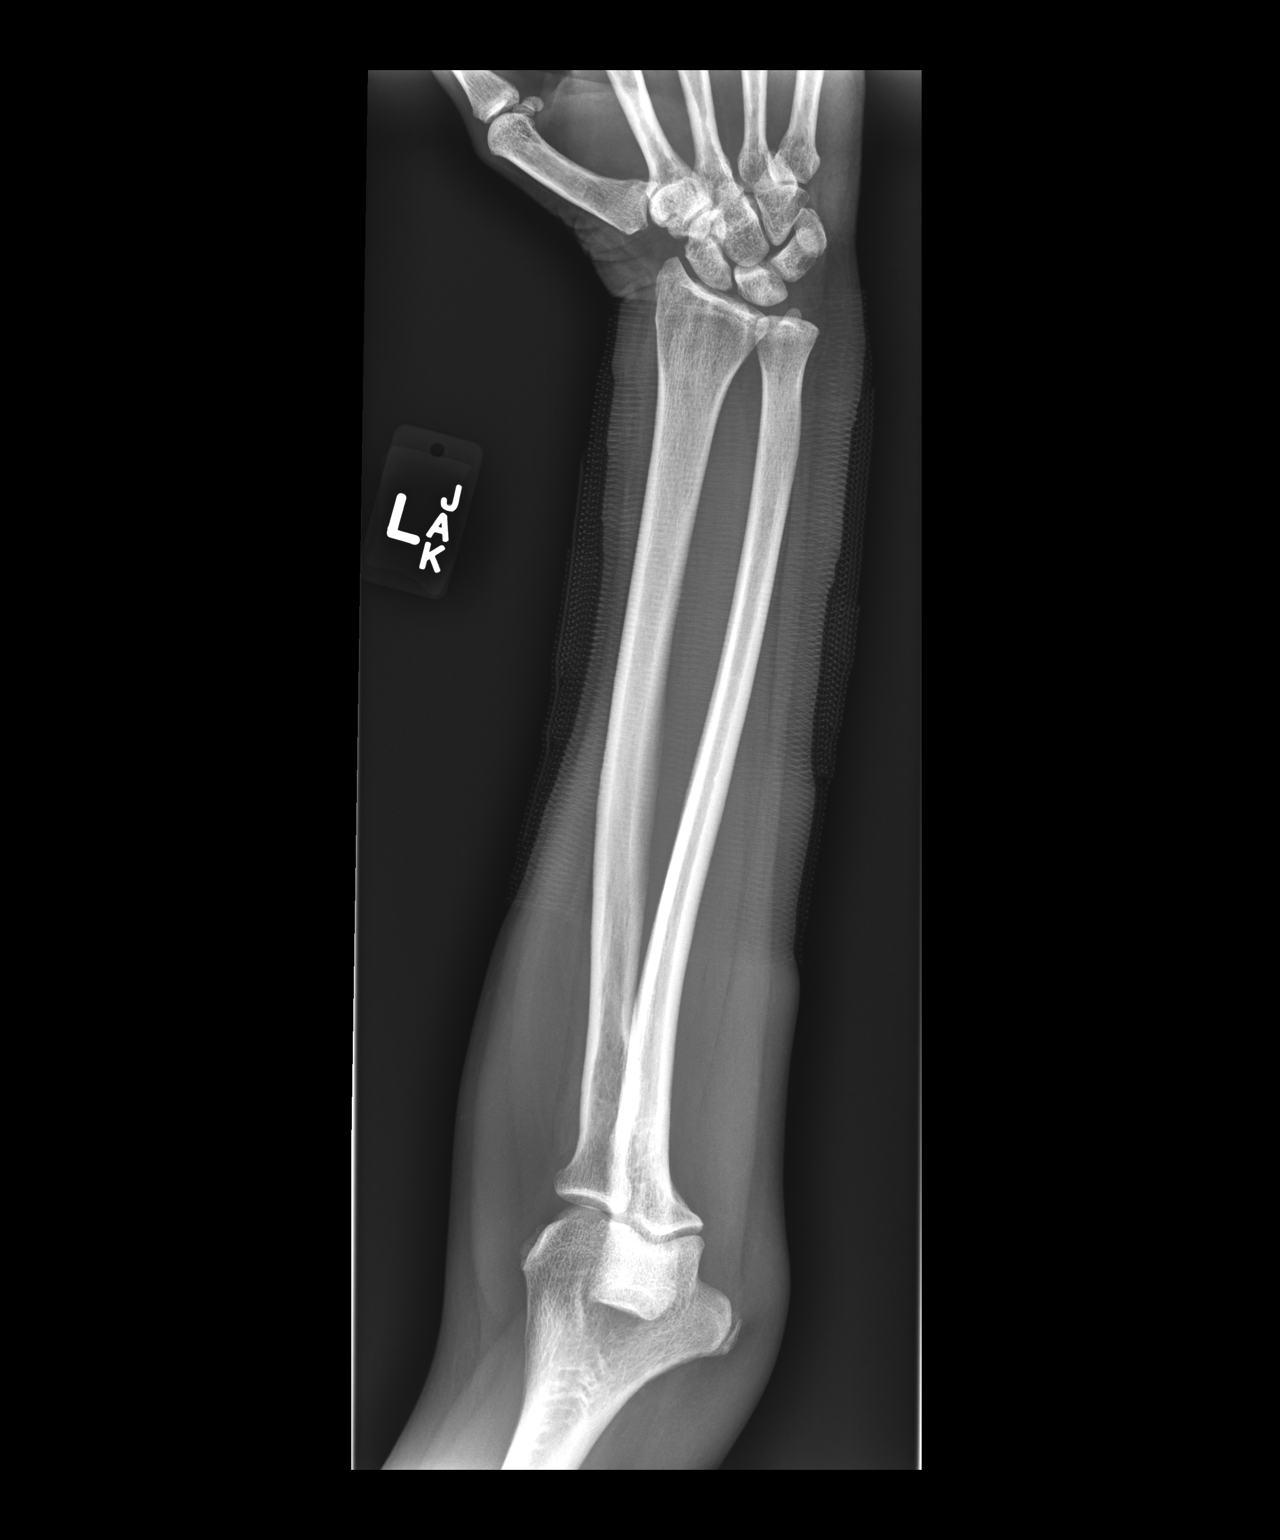

[dg forearm left (2 of 2)]
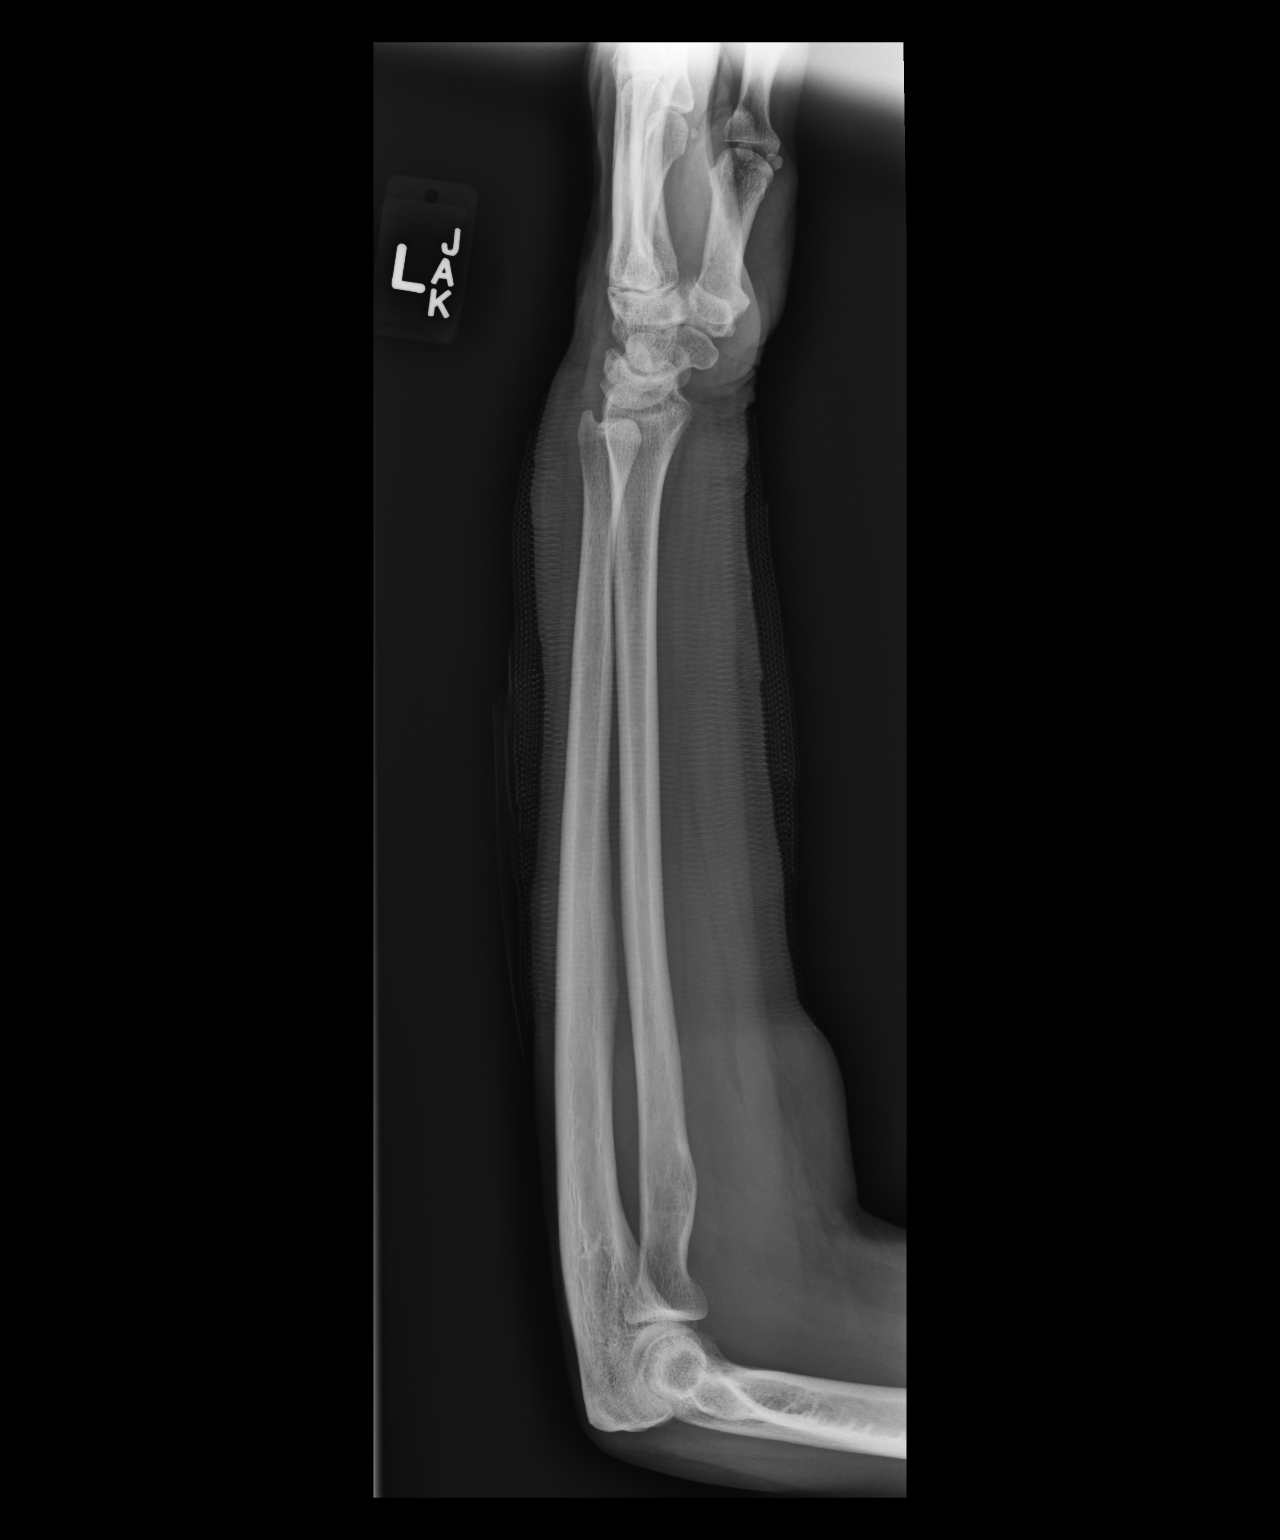

[2 of 2 positions shown; findings below may reference images not displayed]

FINDINGS: No fracture or malalignment. No periostitis or bone destruction.
Soft tissue swelling dorsally at the distal forearm.
IMPRESSION: No acute osseous abnormality

## 2019-04-08 ENCOUNTER — Telehealth: Payer: Self-pay | Admitting: Family Medicine

## 2019-04-08 NOTE — Telephone Encounter (Signed)
Pt is calling to get results from x- ray. Please call PT. Thanks [phone]

## 2019-04-09 NOTE — Progress Notes (Signed)
VM left with patient informing her of her normal x-ray, which means that there is no bony abnormality.  Unfortunately, we still do not know why she has swelling in the area, but I told her that this may be due to venous insufficiency since she has this problem in her legs as well.  I told her that I will continue to think about options for further workup and let her know if I think of an option.

## 2019-04-20 ENCOUNTER — Telehealth: Payer: Self-pay | Admitting: Family Medicine

## 2019-04-20 NOTE — Telephone Encounter (Signed)
Called patient and left a voice message concerning her left forearm swelling.  Since we could not obtain ultrasound because there was no discrete swollen area, and forearm x-ray was negative, we do not have a specific cause of her swelling, although I suspect it may be due to venous insufficiency.  While this is much less common in the upper extremities than the lower extremities, the generalized swelling that the patient has would be consistent with this.  After speaking with one of my preceptors, I recommend that patient try a compression sleeve of the left forearm, which she can wear all the time or as she sees fit.  This will hopefully help reduce her swelling, although her swelling will return once she stops wearing it.

## 2019-08-22 ENCOUNTER — Other Ambulatory Visit: Payer: Self-pay | Admitting: Family Medicine

## 2019-08-22 DIAGNOSIS — I10 Essential (primary) hypertension: Secondary | ICD-10-CM

## 2019-10-09 ENCOUNTER — Other Ambulatory Visit: Payer: Self-pay

## 2019-10-09 ENCOUNTER — Encounter: Payer: Self-pay | Admitting: Family Medicine

## 2019-10-09 ENCOUNTER — Ambulatory Visit (INDEPENDENT_AMBULATORY_CARE_PROVIDER_SITE_OTHER): Payer: Self-pay | Admitting: Family Medicine

## 2019-10-09 VITALS — BP 130/78 | HR 85 | Ht 67.0 in | Wt 149.5 lb

## 2019-10-09 DIAGNOSIS — Z1239 Encounter for other screening for malignant neoplasm of breast: Secondary | ICD-10-CM

## 2019-10-09 DIAGNOSIS — M7989 Other specified soft tissue disorders: Secondary | ICD-10-CM

## 2019-10-09 DIAGNOSIS — I10 Essential (primary) hypertension: Secondary | ICD-10-CM

## 2019-10-09 MED ORDER — NAPROXEN 500 MG PO TABS: 500.0000 mg | ORAL_TABLET | Freq: Two times a day (BID) | ORAL | 0 refills | Status: DC

## 2019-10-09 NOTE — Assessment & Plan Note (Signed)
Well-controlled. -Continue HCTZ -Follow-up BMP

## 2019-10-09 NOTE — Progress Notes (Signed)
    SUBJECTIVE:   CHIEF COMPLAINT / HPI:   Left arm swelling Karen Robertson has presents to clinic today for some soreness and possible swelling of the left forearm.  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  She has been taking Aleve for this without any significant benefit.  Her left forearm has been swollen or enlarged for at least 1 year.  It was difficult to elicit a clear history for the duration of this problem.  She was seen in clinic about 6 months ago at which time an x-ray showed no bony abnormalities.  It appears that a follow-up ultrasound was attempted but ultimately not done.  Since that visit, she apparently had significant improvement in symptoms.  It seems that in the past week, she has had new soreness or irritation of this left forearm.  Hypertension She takes her HCTZ daily.  PERTINENT  PMH / PSH: Hypertension, history of left arm enlargement.  OBJECTIVE:   BP 130/78   Pulse 85   Ht 5\' 7"  (1.702 m)   Wt 149 lb 8 oz (67.8 kg)   LMP 08/08/2014 (Exact Date)   SpO2 99%   BMI 23.42 kg/m    General: Well-appearing middle-aged woman resting comfortably in the chair in the exam room. Respiratory: Breathing comfortably on room air.  Left arm Inspection: Some obvious enlargement of the distal left forearm compared to the right arm.  No obvious bony abnormalities.  The area of enlargement appears to be a discrete area roughly 8 cm x 4 cm. Palpation: No significant tenderness to palpation of the forearm. Neurovascularly intact ROM of wrist and finger flexion/extension normal 5/5 strength for finger flexion and extension and elbow flexion and extension and wrist flexion.  POCUS: Bedside ultrasound showed no evidence of cystic appearance, abscess formation or features consistent with cellulitis.  Appearance of this enlargement is most consistent with adipose tissue or muscular tissue.  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  Also considered abscess or cyst formation although ultrasound did not show any evidence of fluid collection.  Very low suspicion for cellulitis or fracture from bony abnormality.  No history of trauma and previous x-ray was unremarkable.  She was encouraged to continue compression with an Ace wrap and to use naproxen for pain control.  Discussed with Dr. Nori Riis after clinic who encouraged shared decision making regarding further work-up.  She was called again after clinic and noted that she would prefer to speak with a surgeon about any risk of sarcoma and a further work-up is necessary. -Referral to surgery placed  ESSENTIAL HYPERTENSION, BENIGN Well-controlled. -Continue HCTZ -Follow-up BMP   Screening for breast cancer -Screening mammography ordered   Karen Haymaker, MD [place]

## 2019-10-09 NOTE — Assessment & Plan Note (Addendum)
Most likely a lipoma.  Also considered abscess or cyst formation although ultrasound did not show any evidence of fluid collection.  Very low suspicion for cellulitis or fracture from bony abnormality.  No history of trauma and previous x-ray was unremarkable.  She was encouraged to continue compression with an Ace wrap and to use naproxen for pain control.  Discussed with Dr. Nori Riis after clinic who encouraged shared decision making regarding further work-up.  She was called again after clinic and noted that she would prefer to speak with a surgeon about any risk of sarcoma and a further work-up is necessary. -Referral to surgery placed

## 2019-10-09 NOTE — Patient Instructions (Signed)
Forearm swelling: I do not think that you have anything dangerous going on in your forearm.  It looks like there is simply some swelling.  I do not see any evidence of a cyst or abscess.  I do not have any reason to think that there is a bony abnormality in your previous x-ray, 6 months ago, showed no evidence of bony problems.  On the ultrasound today, I did not see anything scary.  I recommend you continue to use the Ace wrap for compression and naproxen for pain control.  Feel free to return to clinic if you notice any improvement in the next 3-4 weeks.  Screening for breast cancer: Please call the breast center to set up an appointment for your screening mammogram.

## 2019-10-10 LAB — BASIC METABOLIC PANEL
BUN/Creatinine Ratio: 12 (ref 9–23)
BUN: 9 mg/dL (ref 6–24)
CO2: 23 mmol/L (ref 20–29)
Calcium: 9.8 mg/dL (ref 8.7–10.2)
Chloride: 104 mmol/L (ref 96–106)
Creatinine, Ser: 0.74 mg/dL (ref 0.57–1.00)
GFR calc Af Amer: 105 mL/min/{1.73_m2} (ref >59)
GFR calc non Af Amer: 91 mL/min/{1.73_m2} (ref >59)
Glucose: 88 mg/dL (ref 65–99)
Potassium: 3.4 mmol/L — ABNORMAL LOW (ref 3.5–5.2)
Sodium: 147 mmol/L — ABNORMAL HIGH (ref 134–144)

## 2019-10-13 ENCOUNTER — Encounter: Payer: Self-pay | Admitting: Family Medicine

## 2019-10-16 ENCOUNTER — Other Ambulatory Visit: Payer: Self-pay | Admitting: Family Medicine

## 2019-10-16 DIAGNOSIS — Z1239 Encounter for other screening for malignant neoplasm of breast: Secondary | ICD-10-CM

## 2019-11-17 ENCOUNTER — Ambulatory Visit
Admission: RE | Admit: 2019-11-17 | Discharge: 2019-11-17 | Disposition: A | Discharge status: Home / Self Care | Payer: Self-pay | Source: Ambulatory Visit | Attending: *Deleted | Admitting: *Deleted

## 2019-11-17 ENCOUNTER — Other Ambulatory Visit: Payer: Self-pay

## 2019-11-17 DIAGNOSIS — Z1239 Encounter for other screening for malignant neoplasm of breast: Secondary | ICD-10-CM

## 2019-11-17 IMAGING — MG DIGITAL SCREENING BILAT W/ CAD
4 series · 4 of 4 positions shown · non-contrast
Comparison: Previous exam(s).

CLINICAL DATA: Screening.

EXAM:
DIGITAL SCREENING BILATERAL MAMMOGRAM WITH CAD

[R MLO]
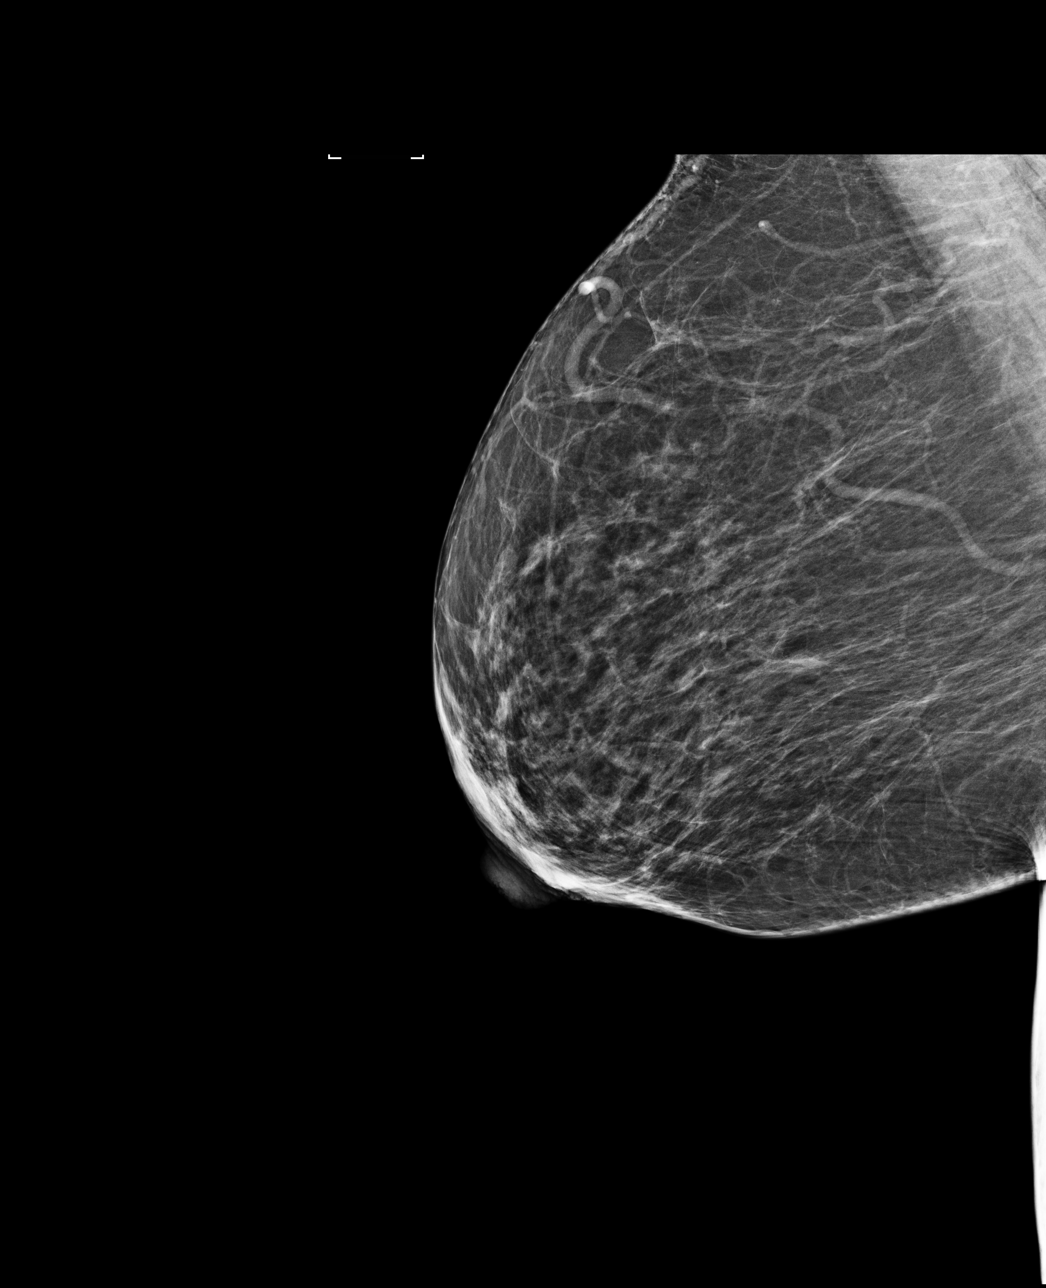

[L MLO]
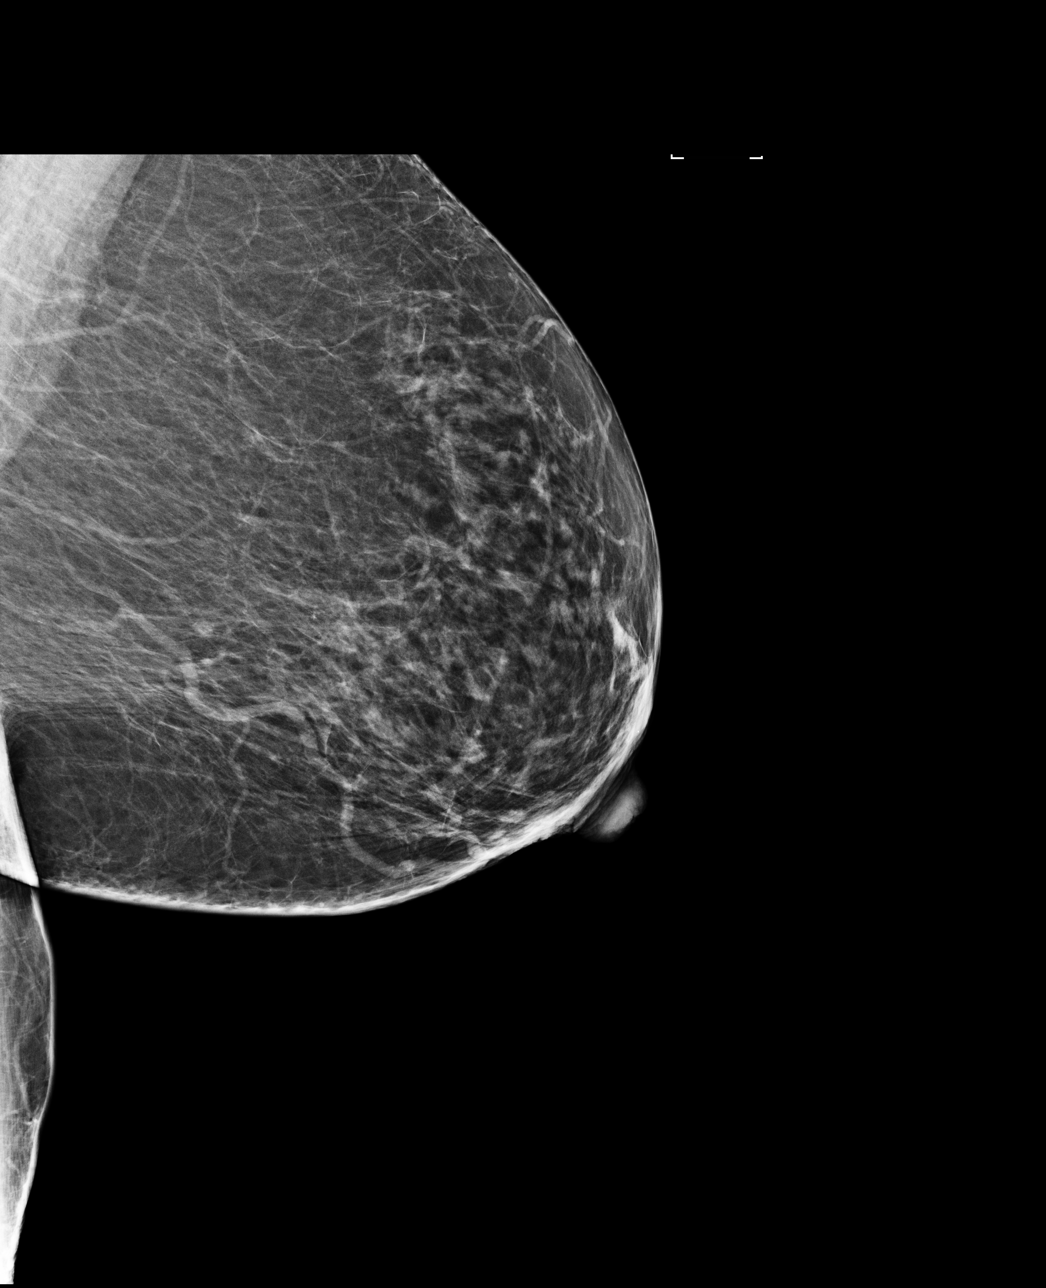

[R CC]
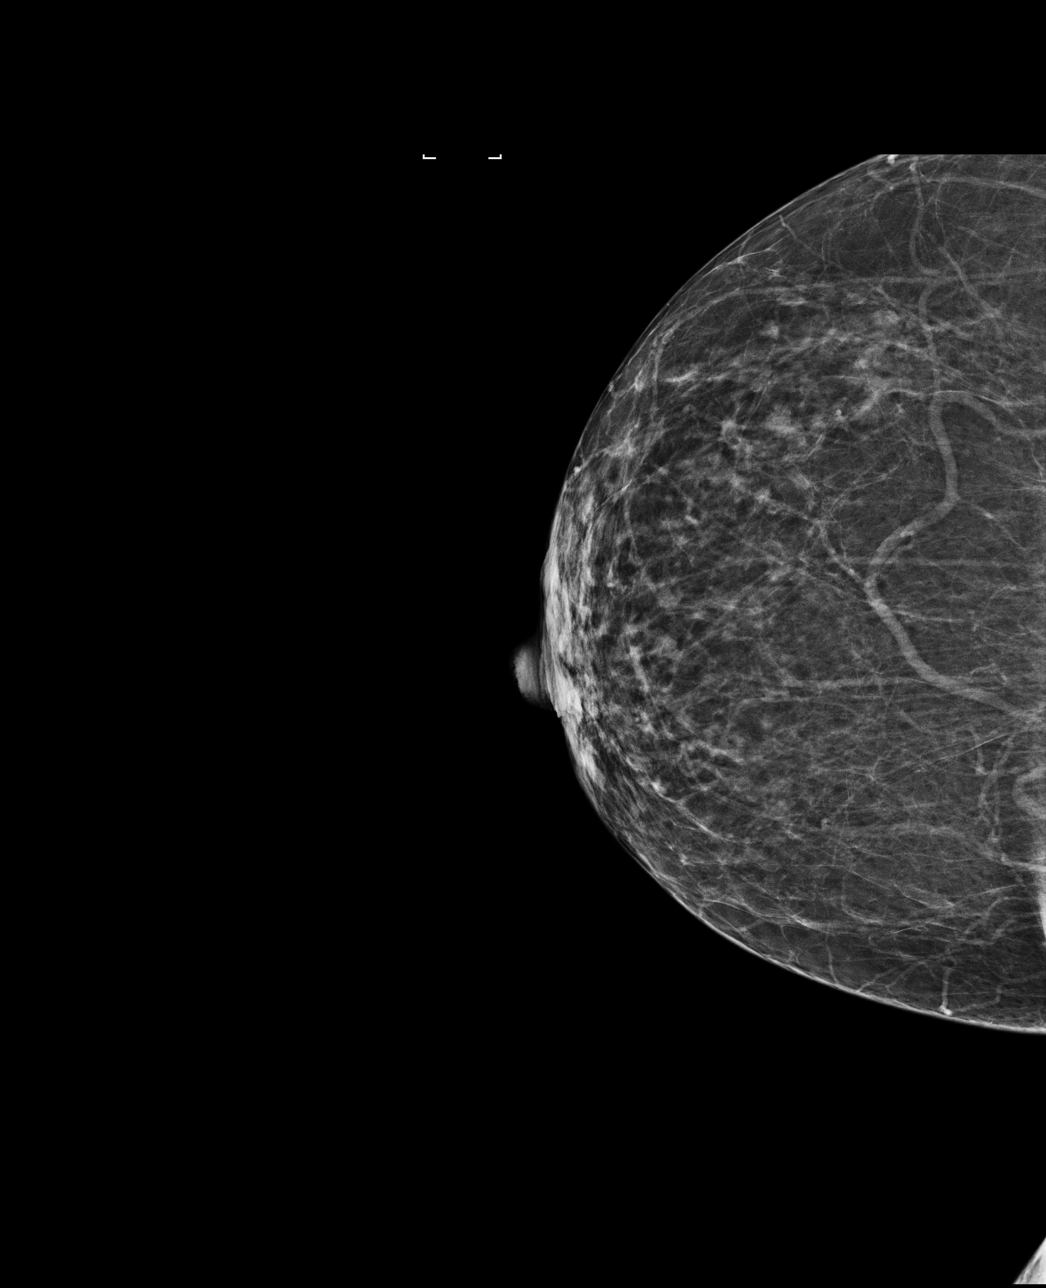

[L CC]
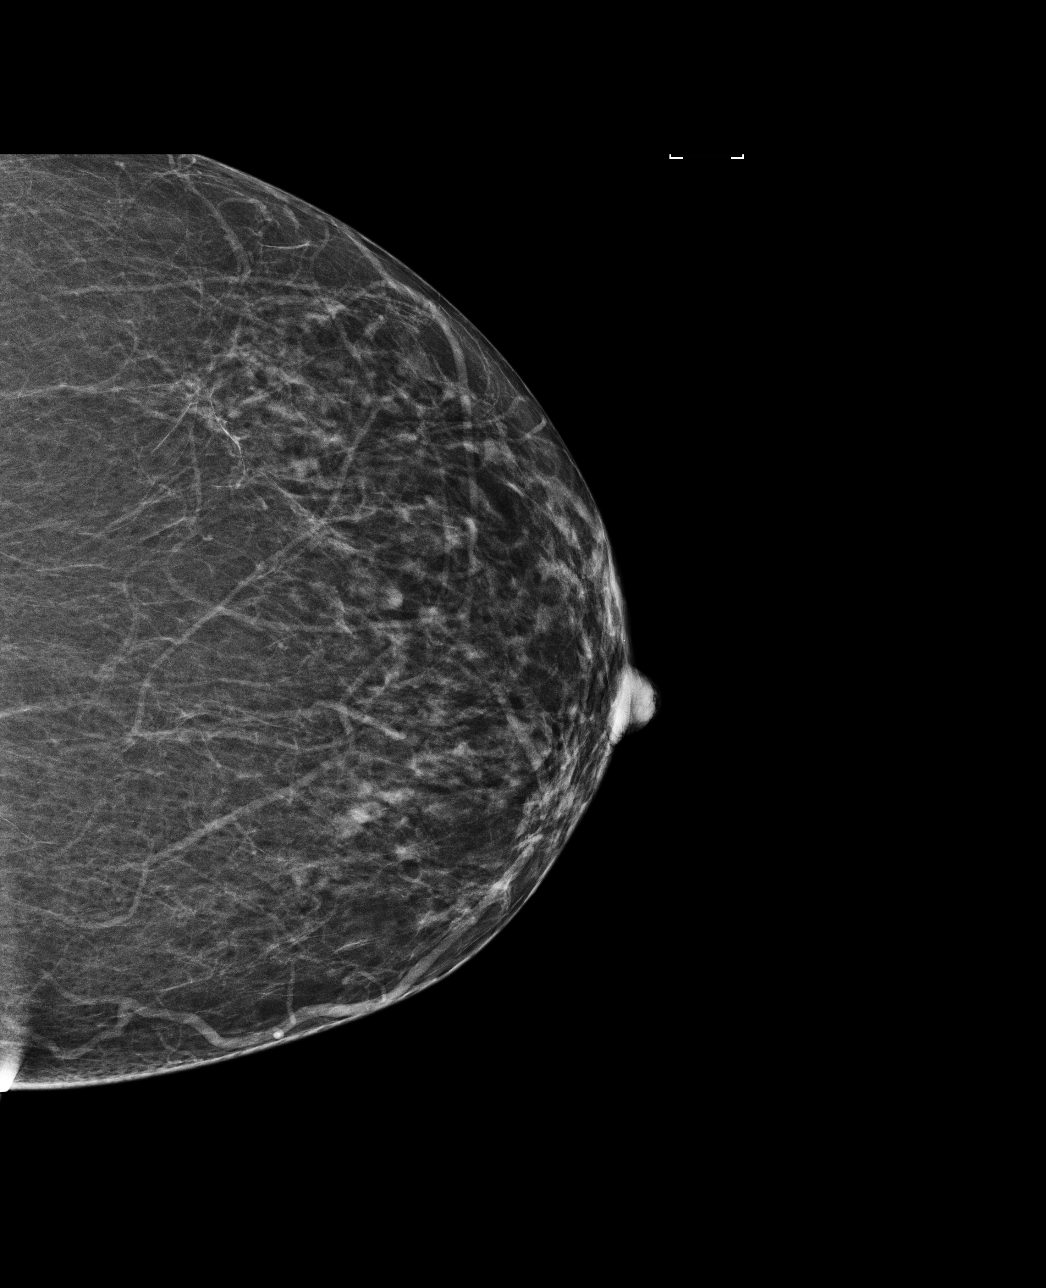

[4 of 4 positions shown; findings below may reference images not displayed]

ACR Breast Density Category c: The breast tissue is heterogeneously
dense, which may obscure small masses.
FINDINGS: There are no findings suspicious for malignancy. Images were
processed with CAD.
IMPRESSION: No mammographic evidence of malignancy. A result letter of this
screening mammogram will be mailed directly to the patient.

RECOMMENDATION:
Screening mammogram in one year. (Code:[0J])

BI-RADS CATEGORY  1: Negative.

## 2020-05-24 NOTE — Progress Notes (Signed)
    SUBJECTIVE:   CHIEF COMPLAINT / HPI: "Check in and leg"   Karen Robertson is a 60 year old female presenting for a check-in and to discuss the following:  Leg concern: Bilateral leg swelling for the past several years, not significantly worse from onset. Chronic history of R>L.  States she has been seen in our office a few times for this concern and believes she saw a specialist as well. Worse at the end of day after standing for prolonged periods. Has not been wearing compression stockings. Elevation helps.  Denies any chest pain, shortness of breath, orthopnea, abdominal pain/distention, loud snoring/difficulty sleeping.  Not significantly painful, does have intermittent itchiness.  She is worried that she has clots in her legs because of the chronic swelling.  Hypertension: Takes HCTZ 25 mg daily, almost out and needs a refill.  Does not check at home.  Denies any headache, lightheadedness/dizziness.  Left arm swelling: Chronic and unchanged. She has been seen for this several times in our office.  Most recently seen in 10/2019, referred to [facility].  She reports that she was evaluated by their office, but does not remember their full recommendation.  Believes they took x-rays and stated everything looks good.  It is visually bothersome to her, but not painful or debilitating in range of motion.  PERTINENT  PMH / PSH: Hypertension, venous insufficiency   OBJECTIVE:   BP 128/84   Pulse 87   Ht 5\' 7"  (1.702 m)   Wt 155 lb 9.6 oz (70.6 kg)   LMP 08/08/2014 (Exact Date)   SpO2 100%   BMI 24.37 kg/m   General: Alert, NAD HEENT: NCAT, MMM Cardiac: RRR no m/g/r Lungs: Clear bilaterally, no increased WOB  Abdomen: soft, non-tender Msk: Moves all extremities spontaneously, normal gait   Left arm: Soft tissue enlargement of left distal forearm compared to right, poorly circumscribed approximately 8X 3-4 cm without any tenderness to palpation.  Rubbery texture underneath.  Full  ROM of the wrist joint on the left with 5/5 wrist/hand strength. Ext: Warm, dry, 2+ DP pulses, +1 pitting edema to midshin bilaterally, R >L, with scattered hyperpigmentation/hemosiderin deposits, and extensive varicose veins       ASSESSMENT/PLAN:   Venous (peripheral) insufficiency Chronic and unchanged.  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  Discussed importance of properly fitting compression stockings daily and elevating legs when possible. Doubt DVT with low probability, however given patient concern will obtain D-dimer.  Obtain urine PCR and updated BMP to rule out contributing etiology.  Rx'd triamcinolone for pruritus.  Left arm swelling Chronic and unchanged.  Exam appears to still be consistent with likely lipoma.  Previously evaluated by [place] [facility] per patient report, not familiar with the recommendations and do not have access to the records.  Requested records further recommendations, further plan pending.  In the interim recommended continued Ace wrap.  ESSENTIAL HYPERTENSION, BENIGN Well-controlled.  Refilled HCTZ 25 mg.  Obtain BMP and lipid panel.  Health care maintenance Due for Pap smear, last in 2016 was normal with negative HPV.  Informed patient of need, will follow up in the next several weeks for this.     Follow-up at convenience within the next month for Pap smear, then in the next 3-6 months for chronic follow-up.  Patriciaann Clan, [place]

## 2020-05-25 ENCOUNTER — Encounter: Payer: Self-pay | Admitting: Family Medicine

## 2020-05-25 ENCOUNTER — Other Ambulatory Visit: Payer: Self-pay

## 2020-05-25 ENCOUNTER — Ambulatory Visit (INDEPENDENT_AMBULATORY_CARE_PROVIDER_SITE_OTHER): Payer: No Typology Code available for payment source | Admitting: Family Medicine

## 2020-05-25 VITALS — BP 128/84 | HR 87 | Ht 67.0 in | Wt 155.6 lb

## 2020-05-25 DIAGNOSIS — T7840XS Allergy, unspecified, sequela: Secondary | ICD-10-CM

## 2020-05-25 DIAGNOSIS — I878 Other specified disorders of veins: Secondary | ICD-10-CM

## 2020-05-25 DIAGNOSIS — I872 Venous insufficiency (chronic) (peripheral): Secondary | ICD-10-CM | POA: Diagnosis not present

## 2020-05-25 DIAGNOSIS — Z0000 Encounter for general adult medical examination without abnormal findings: Secondary | ICD-10-CM

## 2020-05-25 DIAGNOSIS — M7989 Other specified soft tissue disorders: Secondary | ICD-10-CM | POA: Diagnosis not present

## 2020-05-25 DIAGNOSIS — I10 Essential (primary) hypertension: Secondary | ICD-10-CM

## 2020-05-25 HISTORY — DX: Encounter for general adult medical examination without abnormal findings: Z00.00

## 2020-05-25 LAB — D-DIMER, QUANTITATIVE: D-DIMER: 0.31 mg{FEU}/L (ref 0.00–0.49)

## 2020-05-25 MED ORDER — HYDROCHLOROTHIAZIDE 25 MG PO TABS: 1.0000 | ORAL_TABLET | Freq: Every day | ORAL | 1 refills | Status: DC

## 2020-05-25 MED ORDER — TRIAMCINOLONE ACETONIDE 0.1 % EX CREA: 1.0000 "application " | TOPICAL_CREAM | Freq: Two times a day (BID) | CUTANEOUS | 0 refills | Status: AC

## 2020-05-25 MED ORDER — FLUTICASONE PROPIONATE 50 MCG/ACT NA SUSP: 2.0000 | Freq: Every day | NASAL | 0 refills | Status: DC

## 2020-05-25 NOTE — Assessment & Plan Note (Signed)
Chronic and unchanged.  Exam appears to still be consistent with likely lipoma.  Previously evaluated by [place] [facility] per patient report, not familiar with the recommendations and do not have access to the records.  Requested records further recommendations, further plan pending.  In the interim recommended continued Ace wrap.

## 2020-05-25 NOTE — Assessment & Plan Note (Addendum)
Well-controlled.  Refilled HCTZ 25 mg.  Obtain BMP and lipid panel.

## 2020-05-25 NOTE — Patient Instructions (Addendum)
It was wonderful to see you today.   Your leg swelling is likely from venous insufficiency, you can start with knee-high stockings with a pressure of approximately 15-30 mmHg.  Please try to avoid standing for prolonged periods of time if possible, if standing for a while please move your feet back and forth to get your muscles moving.  Anytime that you are able to sit down try to elevate your legs, at nighttime elevate your legs as well.  We will check some labs to make sure that nothing else is contributing.  For your arm, I will request records from [place] to see what they said about that area.  Pending on that, we will discuss further steps if needed.  Please follow-up at your convenience in the next month to have your Pap smear performed.

## 2020-05-25 NOTE — Assessment & Plan Note (Addendum)
Chronic and unchanged.  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  Discussed importance of properly fitting compression stockings daily and elevating legs when possible. Doubt DVT with low probability, however given patient concern will obtain D-dimer.  Obtain urine PCR and updated BMP to rule out contributing etiology.  Rx'd triamcinolone for pruritus.

## 2020-05-25 NOTE — Assessment & Plan Note (Signed)
Due for Pap smear, last in 2016 was normal with negative HPV.  Informed patient of need, will follow up in the next several weeks for this.

## 2020-05-26 ENCOUNTER — Other Ambulatory Visit: Payer: Self-pay | Admitting: Family Medicine

## 2020-05-26 DIAGNOSIS — E781 Pure hyperglyceridemia: Secondary | ICD-10-CM

## 2020-05-26 LAB — LIPID PANEL
Chol/HDL Ratio: 4.5 ratio — ABNORMAL HIGH (ref 0.0–4.4)
Cholesterol, Total: 201 mg/dL — ABNORMAL HIGH (ref 100–199)
HDL: 45 mg/dL (ref >39)
Triglycerides: 1010 mg/dL (ref 0–149)

## 2020-05-26 LAB — PROTEIN / CREATININE RATIO, URINE
Creatinine, Urine: 45.7 mg/dL
Protein, Ur: 4.9 mg/dL
Protein/Creat Ratio: 107 mg/g creat (ref 0–200)

## 2020-05-26 LAB — BASIC METABOLIC PANEL
BUN/Creatinine Ratio: 18 (ref 9–23)
BUN: 11 mg/dL (ref 6–24)
CO2: 22 mmol/L (ref 20–29)
Calcium: 9.5 mg/dL (ref 8.7–10.2)
Chloride: 103 mmol/L (ref 96–106)
Creatinine, Ser: 0.61 mg/dL (ref 0.57–1.00)
Glucose: 93 mg/dL (ref 65–99)
Potassium: 3.6 mmol/L (ref 3.5–5.2)
Sodium: 142 mmol/L (ref 134–144)
eGFR: 104 mL/min/{1.73_m2} (ref >59)

## 2020-05-26 MED ORDER — ROSUVASTATIN CALCIUM 10 MG PO TABS: 10.0000 mg | ORAL_TABLET | Freq: Every day | ORAL | 0 refills | Status: DC

## 2020-05-27 LAB — SPECIMEN STATUS REPORT

## 2020-05-29 ENCOUNTER — Encounter: Payer: Self-pay | Admitting: Family Medicine

## 2020-06-10 ENCOUNTER — Other Ambulatory Visit: Payer: Self-pay | Admitting: Family Medicine

## 2020-06-10 DIAGNOSIS — E781 Pure hyperglyceridemia: Secondary | ICD-10-CM

## 2020-06-10 MED ORDER — ROSUVASTATIN CALCIUM 20 MG PO TABS: 20.0000 mg | ORAL_TABLET | Freq: Every day | ORAL | 1 refills | Status: DC

## 2020-06-16 ENCOUNTER — Other Ambulatory Visit: Payer: Self-pay | Admitting: Family Medicine

## 2020-06-16 DIAGNOSIS — T7840XS Allergy, unspecified, sequela: Secondary | ICD-10-CM

## 2020-06-22 ENCOUNTER — Ambulatory Visit: Payer: No Typology Code available for payment source | Admitting: Family Medicine

## 2020-06-22 NOTE — Progress Notes (Deleted)
    SUBJECTIVE:   CHIEF COMPLAINT / HPI:   ***  PERTINENT  PMH / PSH: ***  OBJECTIVE:   LMP 08/08/2014 (Exact Date)   ***  ASSESSMENT/PLAN:   No problem-specific Assessment & Plan notes found for this encounter.     Patriciaann Clan, [place]   {    This will disappear when note is signed, click to select method of visit    :1}

## 2020-07-11 ENCOUNTER — Ambulatory Visit: Payer: No Typology Code available for payment source | Admitting: Family Medicine

## 2020-07-11 NOTE — Progress Notes (Deleted)
    SUBJECTIVE:   CHIEF COMPLAINT / HPI:   ***  PERTINENT  PMH / PSH: Hypertriglyceridemia, venous insufficiency, hypertension, left arm lipoma  OBJECTIVE:   LMP 08/08/2014 (Exact Date)   ***  ASSESSMENT/PLAN:   No problem-specific Assessment & Plan notes found for this encounter.     Patriciaann Clan, [place]   {    This will disappear when note is signed, click to select method of visit    :1}

## 2020-07-18 ENCOUNTER — Encounter: Payer: Self-pay | Admitting: Family Medicine

## 2020-07-18 ENCOUNTER — Ambulatory Visit (INDEPENDENT_AMBULATORY_CARE_PROVIDER_SITE_OTHER): Payer: No Typology Code available for payment source | Admitting: Family Medicine

## 2020-07-18 ENCOUNTER — Other Ambulatory Visit (HOSPITAL_COMMUNITY)
Admission: RE | Admit: 2020-07-18 | Discharge: 2020-07-18 | Disposition: A | Discharge status: Home / Self Care | Payer: No Typology Code available for payment source | Source: Ambulatory Visit | Attending: Family Medicine | Admitting: Family Medicine

## 2020-07-18 ENCOUNTER — Other Ambulatory Visit: Payer: Self-pay

## 2020-07-18 VITALS — BP 122/70 | HR 80 | Ht 67.0 in | Wt 152.0 lb

## 2020-07-18 DIAGNOSIS — Z124 Encounter for screening for malignant neoplasm of cervix: Secondary | ICD-10-CM

## 2020-07-18 DIAGNOSIS — M7989 Other specified soft tissue disorders: Secondary | ICD-10-CM

## 2020-07-18 DIAGNOSIS — I10 Essential (primary) hypertension: Secondary | ICD-10-CM | POA: Diagnosis not present

## 2020-07-18 DIAGNOSIS — E781 Pure hyperglyceridemia: Secondary | ICD-10-CM

## 2020-07-18 MED ORDER — ATORVASTATIN CALCIUM 20 MG PO TABS: 20.0000 mg | ORAL_TABLET | Freq: Every day | ORAL | 1 refills | Status: DC

## 2020-07-18 NOTE — Assessment & Plan Note (Addendum)
Did not start Crestor due to cost, will send in atorvastatin instead.  Instructed to call our office if she has any issues with cost.  Provided brief encouragement for balanced diet.  Follow-up with repeat lipid panel in 2 months, expect that she will likely need a second agent for control.

## 2020-07-18 NOTE — Assessment & Plan Note (Signed)
Well-controlled.  Continue HCTZ 25 mg daily.  

## 2020-07-18 NOTE — Progress Notes (Signed)
.     SUBJECTIVE:   CHIEF COMPLAINT / HPI: papsmear/trigs   Karen Robertson is a 60 year old female presenting for her Pap smear and to discuss her hypertriglyceridemia.  She reports she overall is doing well.  Last Pap in 11/2014 and normal with negative HPV.  Would like gonorrhea/chlamydia/trichomonas added to Pap today for screening purposes only, asymptomatic.   She was not able to pick up the rosuvastatin because it was $100.  She did not know to call the office for a different medication.  She has tried to be a little bit better about eating less fried foods.  PERTINENT  PMH / PSH: Hypertension, hypertriglyceridemia, left arm lipoma  OBJECTIVE:   BP 122/70   Pulse 80   Ht 5\' 7"  (1.702 m)   Wt 152 lb (68.9 kg)   LMP 08/08/2014 (Exact Date)   SpO2 98%   BMI 23.81 kg/m   General: Alert, NAD HEENT: NCAT, MMM Lungs: No increased WOB  Abdomen: soft Left arm: Unchanged soft tissue enlargement of the left distal forearm in comparison to right, an approximate 8 x 3-4 cm poorly circumscribed area of rubbery texture without any tenderness to palpation.  Palpable radial pulse.  Full ROM of wrist.  Pelvic exam: normal external genitalia, vulva, vagina, cervix, uterus and adnexa, VULVA: normal appearing vulva with no masses, tenderness or lesions, VAGINA: normal appearing vagina with normal color and discharge, no lesions, CERVIX: normal appearing cervix without discharge or lesions, UTERUS: uterus is normal size, shape, consistency and nontender, ADNEXA: normal adnexa in size, nontender and no masses.   ASSESSMENT/PLAN:   Encounter for Papanicolaou smear of cervix Performed Pap smear with HPV testing.  No previous abnormals.  Hypertriglyceridemia Did not start Crestor due to cost, will send in atorvastatin instead.  Instructed to call our office if she has any issues with cost.  Provided brief encouragement for balanced diet.  Follow-up with repeat lipid panel in 2 months, expect that she will  likely need a second agent for control.  Left arm swelling Chronic and unchanged, overall appears to be consistent with likely lipoma.  Patient previously thought that she was evaluated by [place] [facility], however received fax that she had not been seen in her office.  Will replace referral today, desires complete removal.  ESSENTIAL HYPERTENSION, BENIGN Well-controlled.  Continue HCTZ 25 mg daily.    Follow-up in 2 months or sooner if needed  Karen Robertson, [place]

## 2020-07-18 NOTE — Assessment & Plan Note (Signed)
Chronic and unchanged, overall appears to be consistent with likely lipoma.  Patient previously thought that she was evaluated by [place] [facility], however received fax that she had not been seen in her office.  Will replace referral today, desires complete removal.

## 2020-07-18 NOTE — Assessment & Plan Note (Signed)
Performed Pap smear with HPV testing.  No previous abnormals.

## 2020-07-18 NOTE — Patient Instructions (Addendum)
We will recheck your cholesterol and see if it is improved with the medication.  We also did a Pap smear today, this result usually takes several days to come back and then I will send a letter or give you a call with results.

## 2020-07-20 ENCOUNTER — Encounter: Payer: Self-pay | Admitting: Family Medicine

## 2020-07-20 LAB — CYTOLOGY - PAP
Chlamydia: NEGATIVE
Comment: NEGATIVE
Comment: NEGATIVE
Comment: NEGATIVE
Comment: NORMAL
Diagnosis: NEGATIVE
Diagnosis: REACTIVE
High risk HPV: NEGATIVE
Neisseria Gonorrhea: NEGATIVE
Trichomonas: NEGATIVE

## 2020-07-21 ENCOUNTER — Ambulatory Visit (INDEPENDENT_AMBULATORY_CARE_PROVIDER_SITE_OTHER): Payer: No Typology Code available for payment source | Admitting: Family Medicine

## 2020-07-21 ENCOUNTER — Ambulatory Visit
Admission: RE | Admit: 2020-07-21 | Discharge: 2020-07-21 | Disposition: A | Discharge status: Home / Self Care | Payer: No Typology Code available for payment source | Source: Ambulatory Visit | Attending: Family Medicine | Admitting: Family Medicine

## 2020-07-21 ENCOUNTER — Other Ambulatory Visit: Payer: Self-pay

## 2020-07-21 VITALS — BP 120/70 | HR 78 | Ht 67.0 in | Wt 148.0 lb

## 2020-07-21 DIAGNOSIS — S4990XA Unspecified injury of shoulder and upper arm, unspecified arm, initial encounter: Secondary | ICD-10-CM

## 2020-07-21 DIAGNOSIS — M7989 Other specified soft tissue disorders: Secondary | ICD-10-CM

## 2020-07-21 DIAGNOSIS — I872 Venous insufficiency (chronic) (peripheral): Secondary | ICD-10-CM | POA: Diagnosis not present

## 2020-07-21 DIAGNOSIS — I10 Essential (primary) hypertension: Secondary | ICD-10-CM | POA: Diagnosis not present

## 2020-07-21 IMAGING — CR DG WRIST COMPLETE 3+V*R*
4 series · 4 of 4 positions shown · non-contrast
Comparison: None.

CLINICAL DATA: Pain and swelling

EXAM:
RIGHT WRIST - COMPLETE 3+ VIEW

[x wrist pa right]
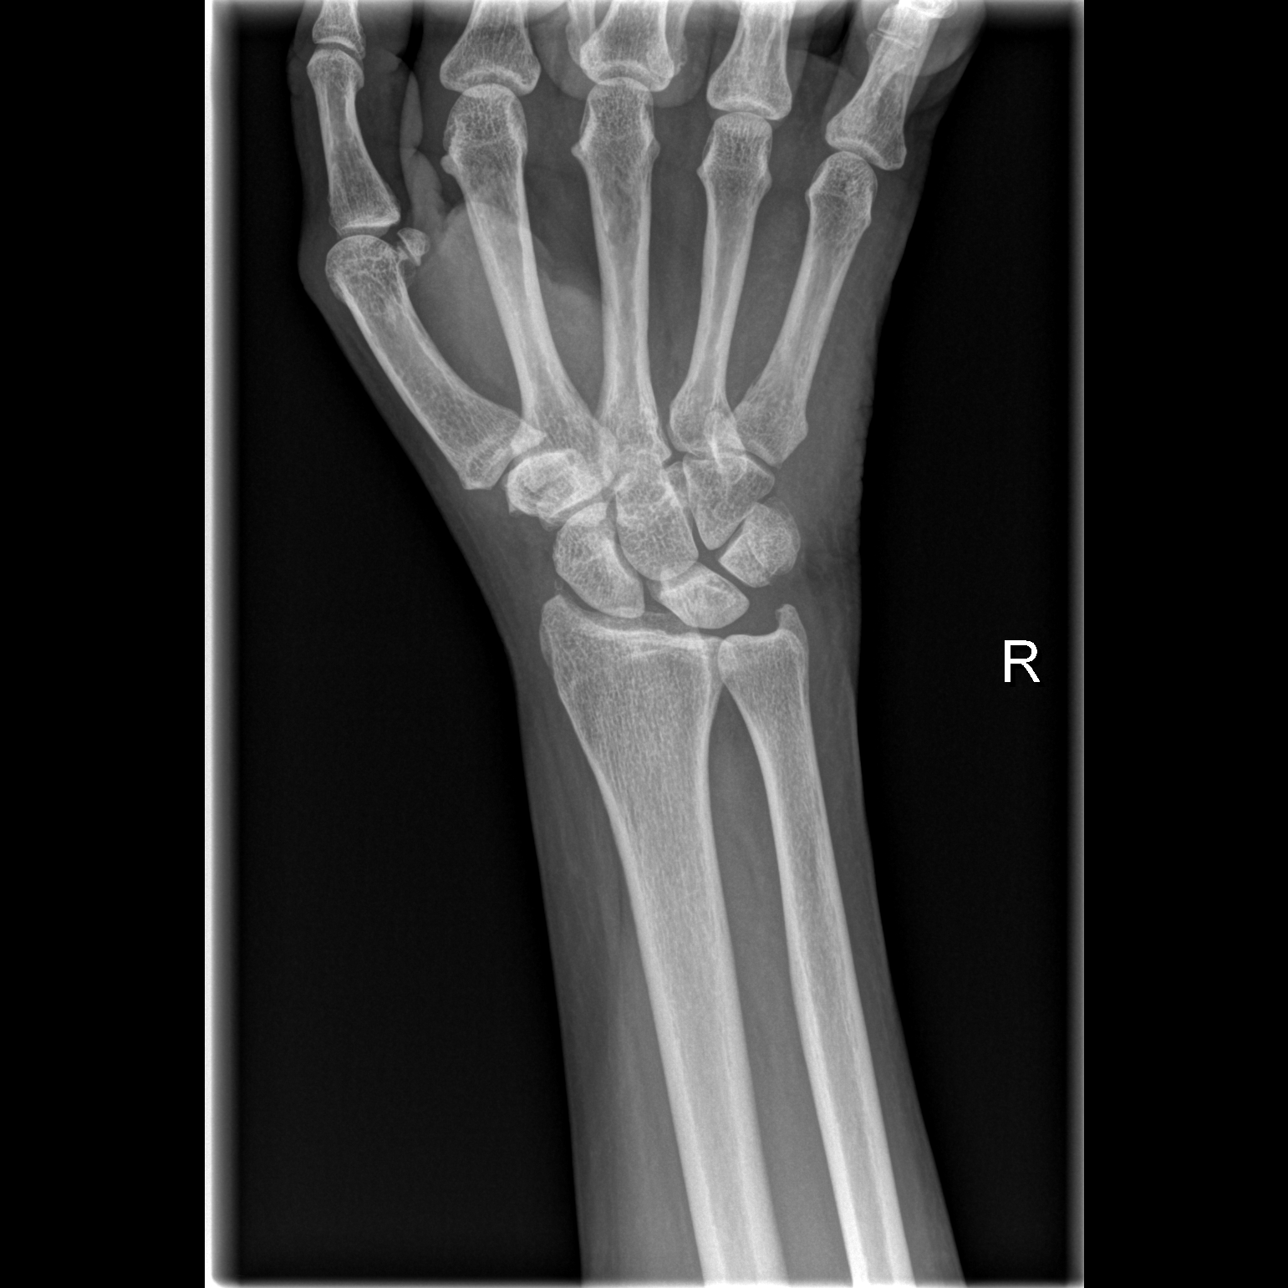

[x wrist obl right]
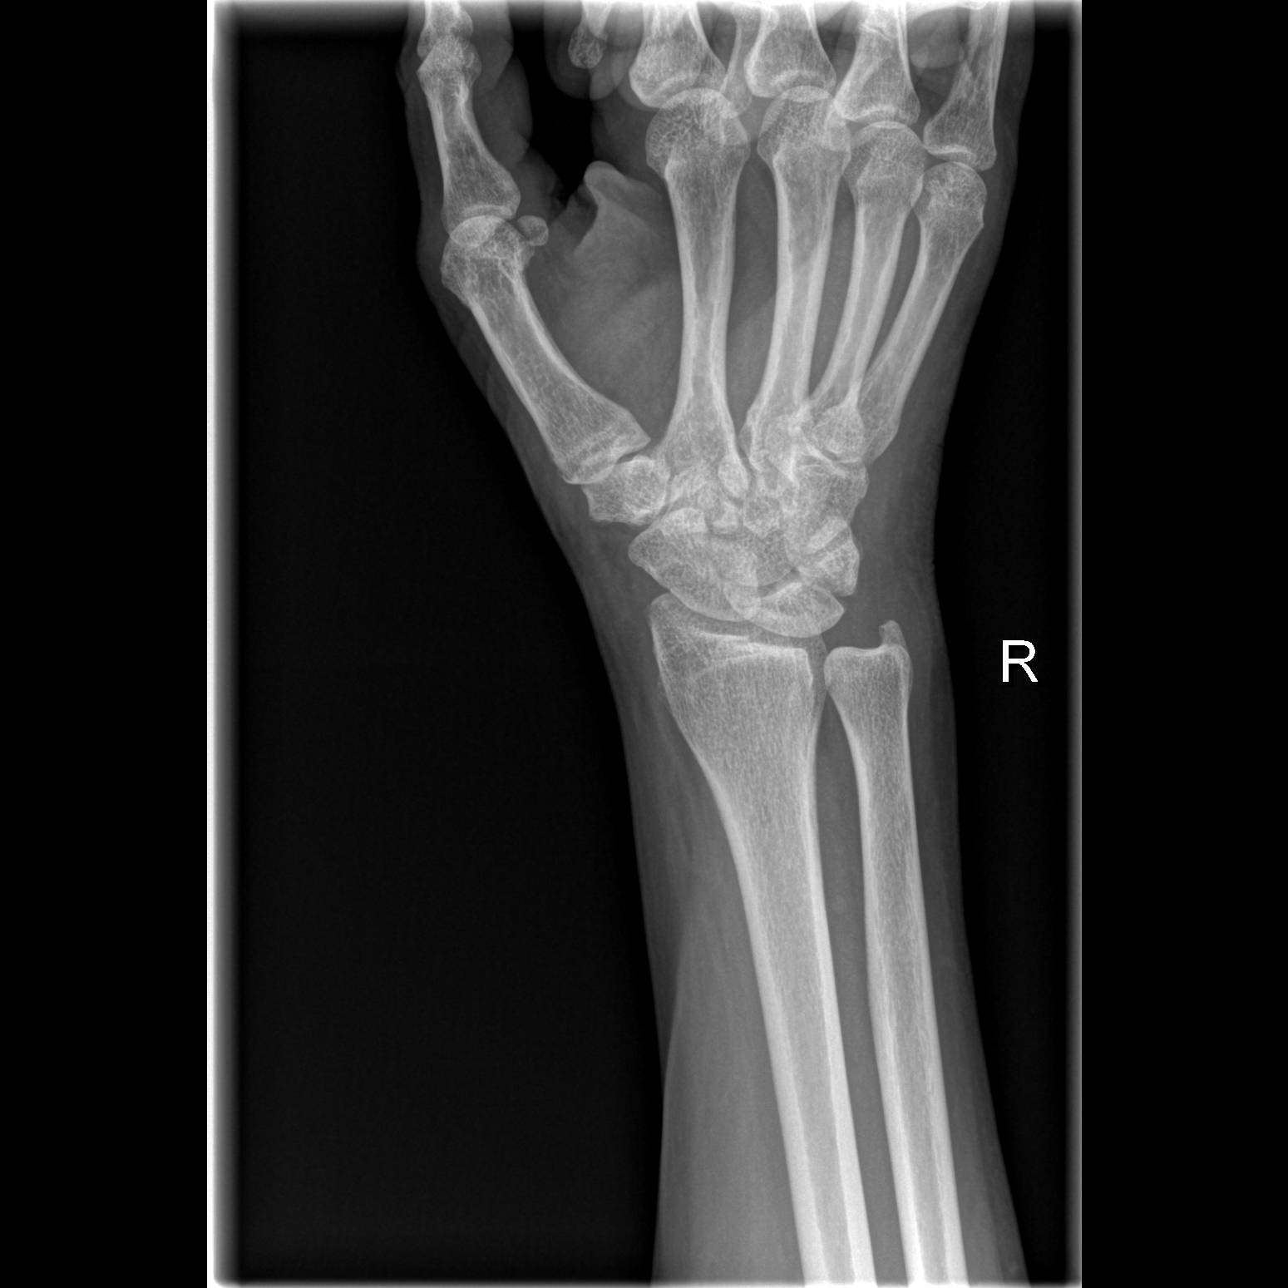

[x wrist lat right]
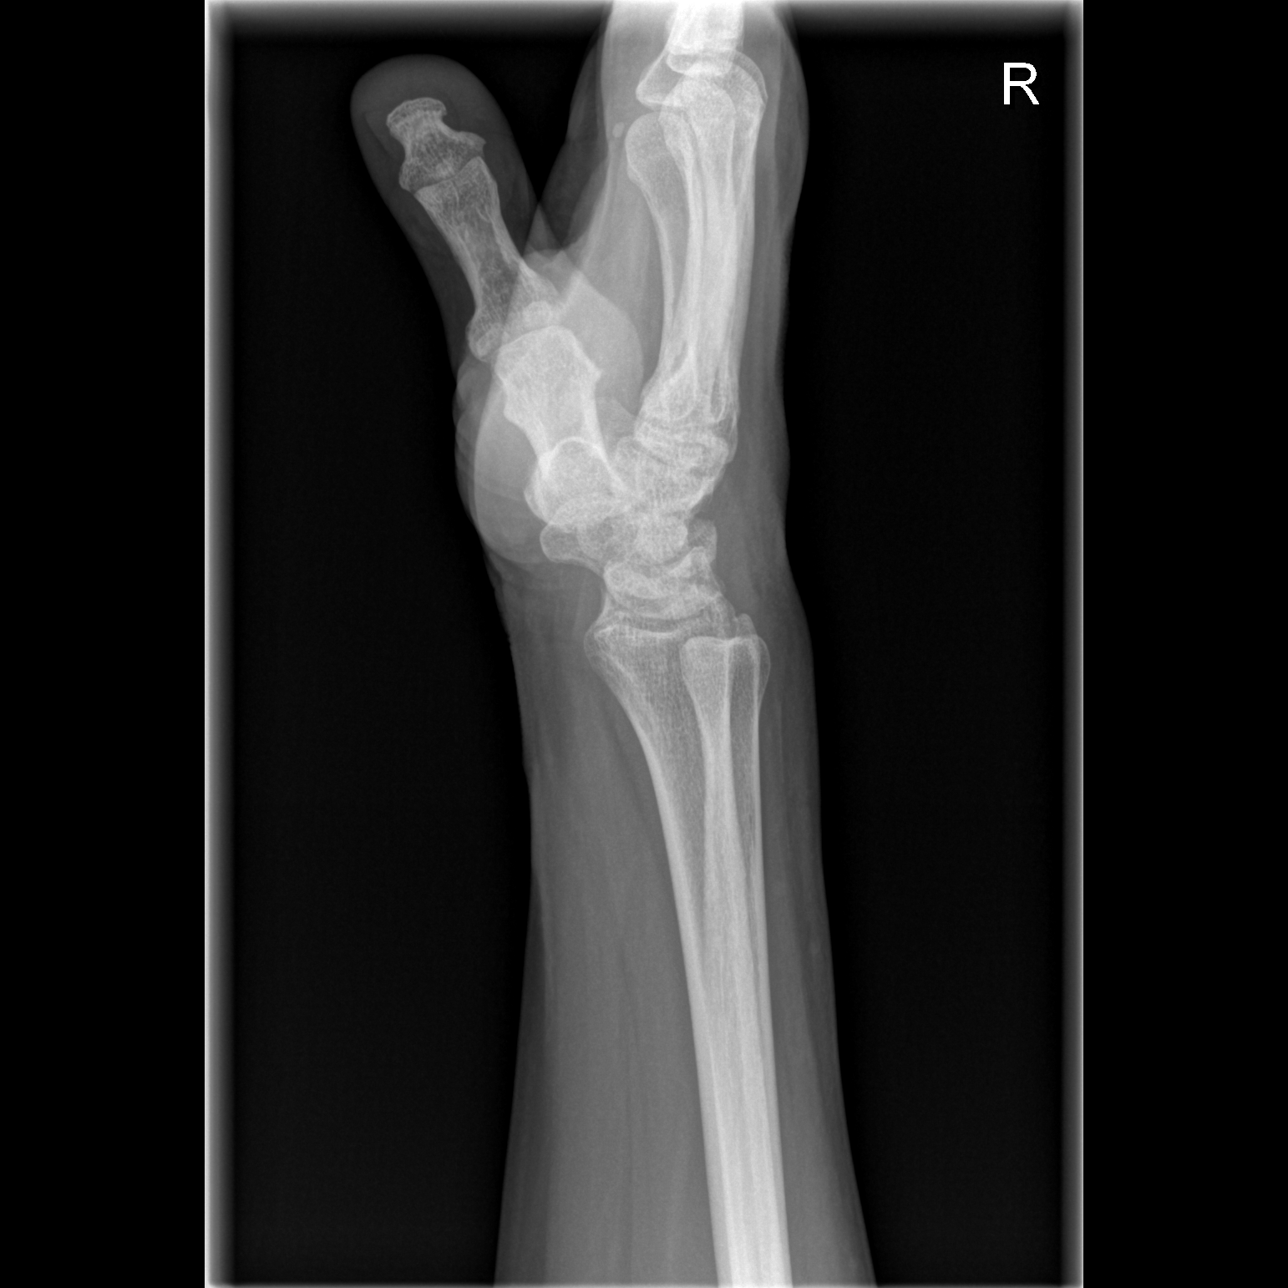

[x navicular]
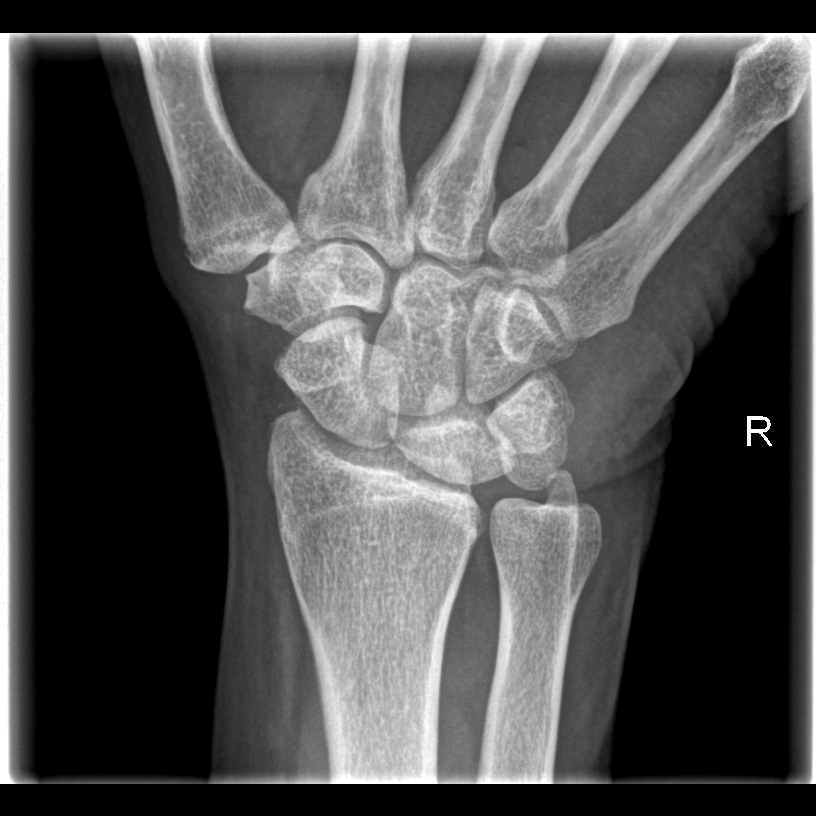

[4 of 4 positions shown; findings below may reference images not displayed]

FINDINGS: Frontal, oblique, lateral, and ulnar deviation scaphoid images
obtained. No evident fracture or dislocation. Slight osteoarthritic
change first carpal-metacarpal joint. Other joint spaces appear
normal. No erosive change. Minimal calcification immediately distal
to the radial styloid.
IMPRESSION: Slight osteoarthritic change in the first carpal-metacarpal joint.
No fracture or dislocation. Slight calcification immediately distal
to the radial styloid is likely of arthropathic etiology.

## 2020-07-21 IMAGING — CR DG HAND COMPLETE 3+V*R*
3 series · 3 of 3 positions shown · non-contrast
Comparison: None.

CLINICAL DATA: Pain and swelling

EXAM:
RIGHT HAND - COMPLETE 3+ VIEW

[x hand pa right]
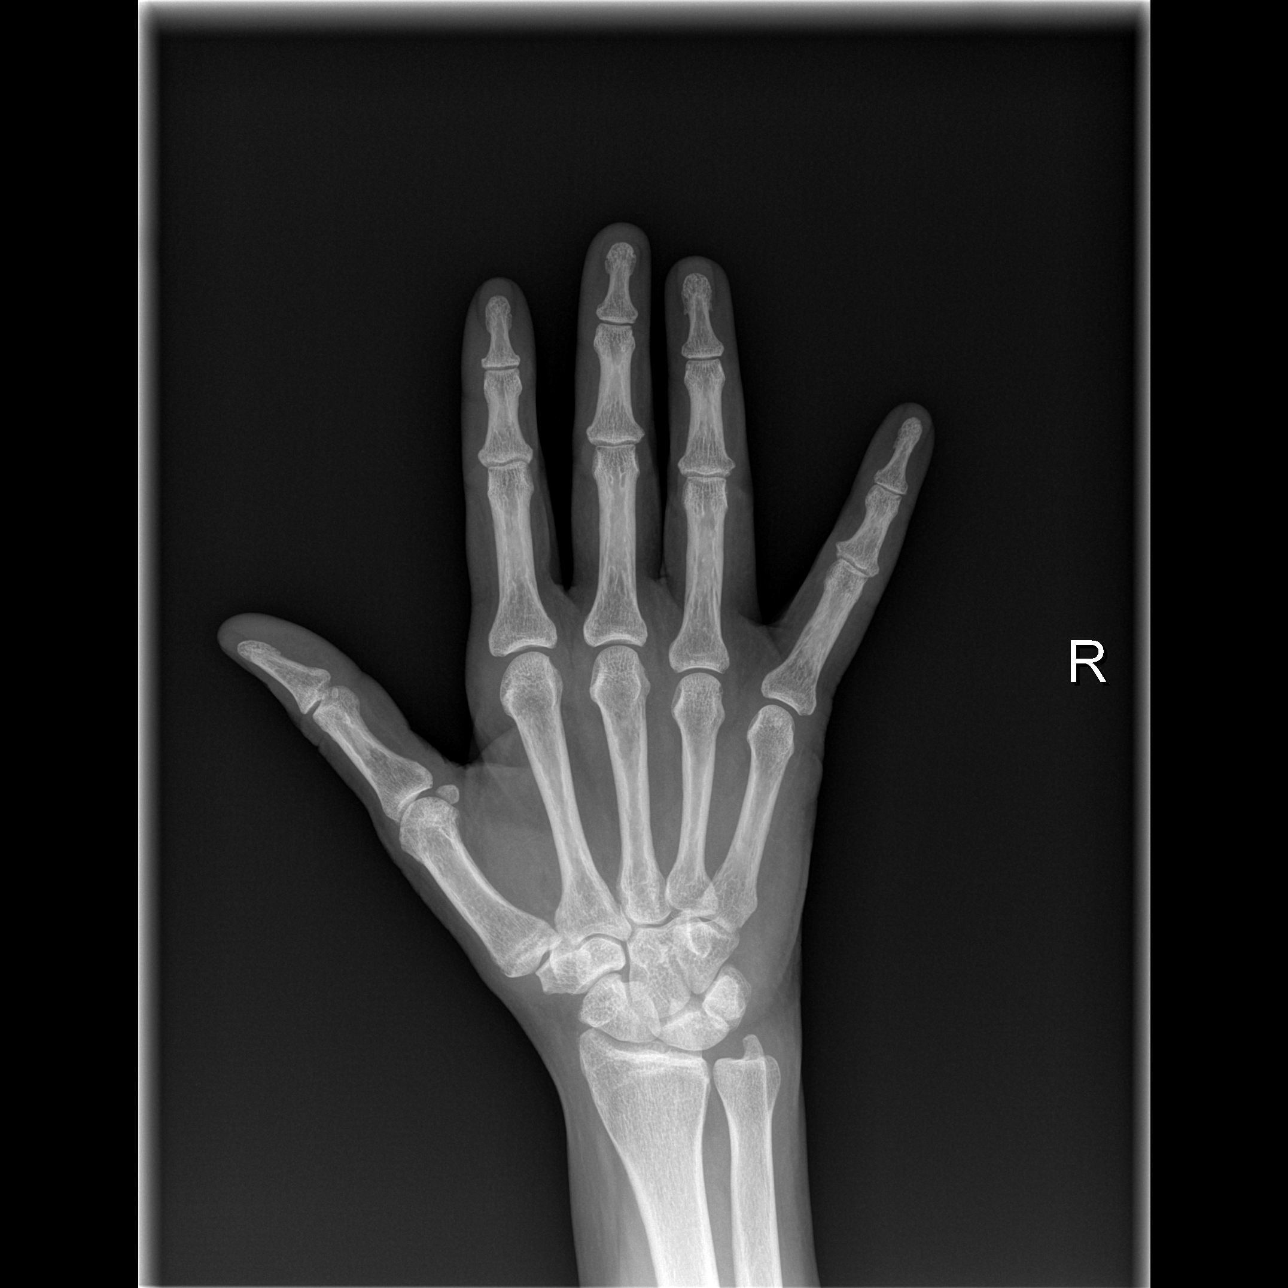

[x hand oblique right]
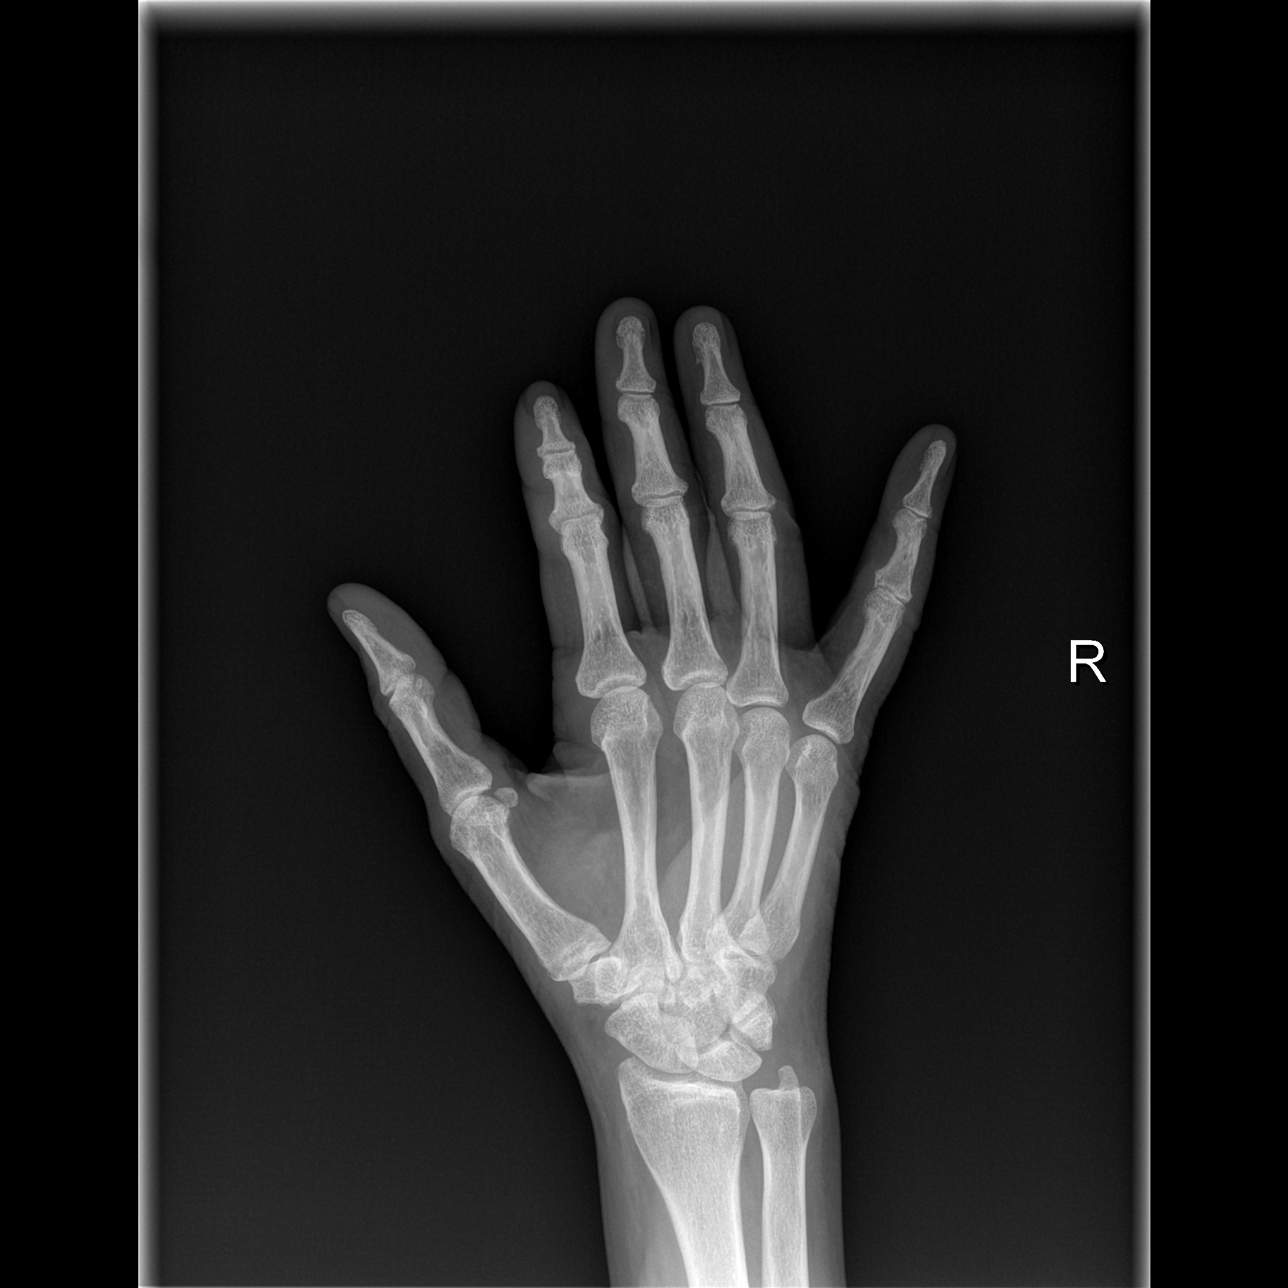

[x hand lat right]
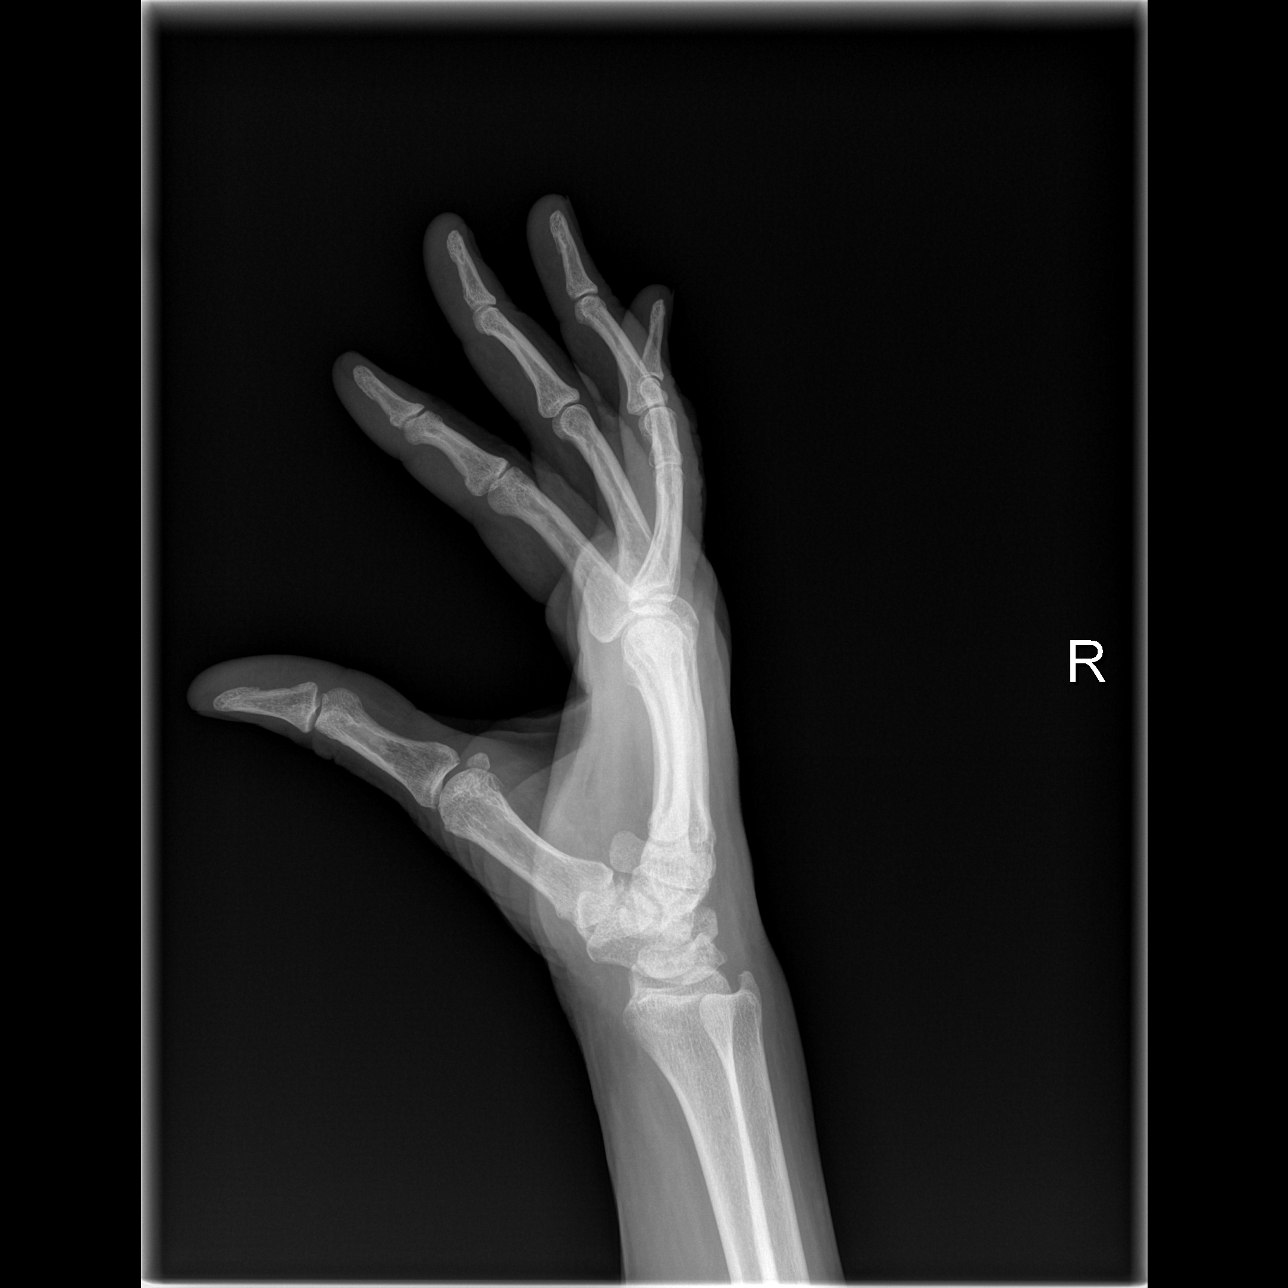

[3 of 3 positions shown; findings below may reference images not displayed]

FINDINGS: Frontal, oblique, and lateral views were obtained. No fracture or
dislocation. Joint spaces appear normal. No erosive change.
IMPRESSION: No fracture or dislocation.  No evident arthropathy.

## 2020-07-21 MED ORDER — NAPROXEN 500 MG PO TABS: 500.0000 mg | ORAL_TABLET | Freq: Two times a day (BID) | ORAL | 0 refills | Status: AC

## 2020-07-21 NOTE — Patient Instructions (Signed)
It was wonderful to see you today.  We will get some labs today to make sure you do not have a lot of inflammation in your body.  I have sent in a medication called naproxen, this is an anti-inflammatory and pain medication for you to take for the next week.  Recommend that you continue to ice it and you can also continue bandaging it.  I will write you a letter for work.

## 2020-07-21 NOTE — Assessment & Plan Note (Signed)
Acute without known preceding injury or trauma, predominantly localized around MCP joints.  Unclear etiology, will obtain hand/wrist x-rays in case of accidentally hitting her hand while sleeping to rule out fracture or erosion of joints.  ESR/CRP to broadly evaluate for inflammatory arthritis, negative RF/ANA in the past.  Atypical for gout, however will check uric acid.  No overlying erythema/rest to suggest concern for cellulitis.  Rx'd naproxen BID for pain/inflammation.  Continue ice, elevation.

## 2020-07-21 NOTE — Progress Notes (Signed)
    SUBJECTIVE:   CHIEF COMPLAINT / HPI: "hurt arm"   Ms. Kagan is 60 year old female presenting to discuss an arm injury.   Reports she noticed swelling of right arm on 6/14 when waking up that morning. She does remember any proceeding injury or trauma. It is sore. She was using a ACE bandage and ice pack with some relief. 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 Denies any numbness or tingling, fever, redness. Otherwise has felt well. Thinks she might have some bruising on her palm. Feels a little weak because of the soreness. She has not had this before.   She has no known history of inflammatory arthritis/rheumatoid.  She did have a negative ANA and rheumatoid factor in 2016 when she had an episode of arm swelling.  PERTINENT  PMH / PSH: hypertension, hypertriglyceridemia, left arm lipoma    OBJECTIVE:   BP 120/70   Pulse 78   Ht $R'5\' 7"'Ye$  (1.702 m)   Wt 148 lb (67.1 kg)   LMP 08/08/2014 (Exact Date)   SpO2 96%   BMI 23.18 kg/m    General: Alert, NAD HEENT: NCAT, MMM Lungs: No increased WOB  Ext: Warm, dry, 2+ distal pulses Right Hand: Inspection: Soft tissue swelling most notably around dorsal MCP joints and mild over the dorsum of her hand diffusely.  Slightly warm to touch without any erythema. Palpation: Generally TTP over MCP joints, carpal bones, and digits 2-5.  No tenderness over the anatomical snuffbox. ROM: Full ROM of the digits and wrist however with some discomfort especially with handgrip and wrist pronation/supination. Fully able to extend and flex finger. Strength: 5/5 strength in the forearm, wrist and interosseus muscles Neurovascular: NV intact, palpable radial pulse  Fingers:  No swelling in PIP, DIP joints, swelling only to MCPs.    ASSESSMENT/PLAN:   Swelling of right hand Acute without known preceding injury or trauma, predominantly localized around MCP joints.  Unclear etiology,  will obtain hand/wrist x-rays in case of accidentally hitting her hand while sleeping to rule out fracture or erosion of joints.  ESR/CRP to broadly evaluate for inflammatory arthritis, negative RF/ANA in the past.  Atypical for gout, however will check uric acid.  No overlying erythema/rest to suggest concern for cellulitis.  Rx'd naproxen BID for pain/inflammation.  Continue ice, elevation.    Recommended close follow-up on Monday to assess for improvement.  ED precautions discussed.  Provided work letter to be out through Tuesday, 6/21.  Patriciaann Clan, [place]

## 2020-07-22 LAB — SEDIMENTATION RATE: Sed Rate: 5 mm/hr (ref 0–40)

## 2020-07-22 LAB — C-REACTIVE PROTEIN: CRP: 1 mg/L (ref 0–10)

## 2020-07-22 LAB — URIC ACID: Uric Acid: 10.5 mg/dL — ABNORMAL HIGH (ref 3.0–7.2)

## 2020-07-25 ENCOUNTER — Encounter: Payer: Self-pay | Admitting: Family Medicine

## 2020-07-25 ENCOUNTER — Ambulatory Visit (INDEPENDENT_AMBULATORY_CARE_PROVIDER_SITE_OTHER): Payer: No Typology Code available for payment source | Admitting: Family Medicine

## 2020-07-25 ENCOUNTER — Other Ambulatory Visit: Payer: Self-pay

## 2020-07-25 DIAGNOSIS — I10 Essential (primary) hypertension: Secondary | ICD-10-CM | POA: Diagnosis not present

## 2020-07-25 DIAGNOSIS — M7989 Other specified soft tissue disorders: Secondary | ICD-10-CM | POA: Diagnosis not present

## 2020-07-25 MED ORDER — VALSARTAN 40 MG PO TABS: 40.0000 mg | ORAL_TABLET | Freq: Every day | ORAL | 3 refills | Status: DC

## 2020-07-25 NOTE — Progress Notes (Signed)
    SUBJECTIVE:   CHIEF COMPLAINT / HPI:   Hand: pain is improving.  Pain is mostly near the 5th digit. Patient states she was not able to get the medication from the pharmacy because it cost too much money.  She states it was over $50 with a coupon. Patient currently taking her blood pressure medication.    PERTINENT  PMH / PSH: HTN  OBJECTIVE:   BP 100/72   Pulse 84   Ht 5\' 7"  (1.702 m)   Wt 151 lb 9.6 oz (68.8 kg)   LMP 08/08/2014 (Exact Date)   SpO2 97%   BMI 23.74 kg/m   Gen: alert, oriented. No acute distress Ext: som swelling bw the 2nd and 3rd MCP joints. No tenderness there.  Only tender at the [facility] and 5th MCP joint of the right hand. Strength equal b/l. Otherwise normal exam.  Psych: patient had difficulty understanding and repeating back basic instructions on how to get her medication and take it. Had to repeat it several times.   ASSESSMENT/PLAN:   Swelling of right hand 10.5 uric acid at last visit.  On a thiazide for BP.  Gout is most likely cause of her pain.  Also on the differential is osteoarthritis. Went over in detail with patient that she can get over the counter aleve for < $10 and can take 2 OTC aleve twice a day over the next few weeks. Will switch thiazide to valsartan. F/u in 1 week for bmp and bp recheck.   Essential hypertension, benign discontinuing hctz and switching to valsartan due to possible gout flare.  Will need to make sure patient is taking new medications correctly as she appears to have difficulty understanding what we were talking about it.  Typed instructions in AVS and bolded the important parts to help her.      Karen Pike, MD [place]

## 2020-07-25 NOTE — Patient Instructions (Addendum)
It was nice to see you today,  I would like you to pick up naproxen (generic Aleve) over-the-counter at the grocery or pharmacy.  The over-the-counter dose is usually 225 mg.  I would like you to take 2 of these tablets twice a day for the next week.  This will be the same amount as your medication that was prescribed by Dr. Higinio Plan.  Do not also take the new medicine prescribed by Dr. Higinio Plan.  Because you have gout I am stopping your hydrochlorothiazide and starting you on another medication for blood pressure called valsartan.  I would like you to follow-up in 1 week to check your pain, but also to check your kidney function and blood pressure.  Have a great day,  Karen Marker, MD

## 2020-07-27 NOTE — Assessment & Plan Note (Signed)
10.5 uric acid at last visit.  On a thiazide for BP.  Gout is most likely cause of her pain.  Also on the differential is osteoarthritis. Went over in detail with patient that she can get over the counter aleve for < $10 and can take 2 OTC aleve twice a day over the next few weeks. Will switch thiazide to valsartan. F/u in 1 week for bmp and bp recheck.

## 2020-07-27 NOTE — Assessment & Plan Note (Addendum)
discontinuing hctz and switching to valsartan due to possible gout flare.  Will need to make sure patient is taking new medications correctly as she appears to have difficulty understanding what we were talking about it.  Typed instructions in AVS and bolded the important parts to help her.

## 2020-07-30 ENCOUNTER — Other Ambulatory Visit: Payer: Self-pay | Admitting: Family Medicine

## 2020-07-30 DIAGNOSIS — E781 Pure hyperglyceridemia: Secondary | ICD-10-CM

## 2020-08-02 NOTE — Progress Notes (Addendum)
    SUBJECTIVE:   CHIEF COMPLAINT / HPI: "check in BP"  Karen Robertson is a 59 year old female presenting for follow-up of hypertension.  She was recently seen by myself on 6/13 with concerns for possible hand gout, then seen by Dr. Jeannine Kitten on 6/20 and transferred her from HCTZ to valsartan to avoid propensity for gout.  Uric acid level was 10.5, however this is her first presumed flare.  Today she reports that her hand is significantly better.  She is not having any further swelling or pain in her right hand.  She was not able to pick up the valsartan due to cost and has been still taking her HCTZ.  She does not want to switch her blood pressure medication if she does not have to.  She also is not sure if she picked up the atorvastatin for her cholesterol.  She cannot remember if it was too expensive.  PERTINENT  PMH / PSH: Hypertension, venous insufficiency stage IV, left arm lipoma, hypertriglyceridemia, presumed gout  OBJECTIVE:   BP (!) 125/91   Pulse 92   Wt 149 lb 12.8 oz (67.9 kg)   LMP 08/08/2014 (Exact Date)   SpO2 97%   BMI 23.46 kg/m    General: Alert, NAD HEENT: NCAT, MMM Lungs: No increased WOB  Msk: Moves all extremities spontaneously  Ext: Warm, dry, full ROM of wrist and finger joints of the right hand.  No soft tissue swelling remaining.  5/5 upper extremity strength.  ASSESSMENT/PLAN:   Essential hypertension, benign Well-controlled on HCTZ.  She was unable to afford valsartan and prefers to stay with HCTZ.  She is aware that HCTZ may increase propensity for gout, however given this was her first presumed flare and she has tolerated this medication for several years, she would rather monitor for recurrent symptoms.  Monitor BP intermittently at home.  Swelling of right hand Presumed gout in 07/2020, completely resolved.  If recurrent episode, instructed to start NSAIDs immediately.  Follow-up if recurrent, should consider switching antihypertensive.  Additionally with  uric acid of 10.5, could consider preventative allopurinol.  Hypertriglyceridemia Unable to afford atorvastatin/rosuvastatin via [facility].  Printed good Rx coupon for $8 atorvastatin at [facility].  Instructed to let me know if she has any difficulty with cost after this.    Follow-up in approximately 1 month or sooner to check in with new PCP.  Provided work note to return tomorrow.  Patriciaann Clan, [place]

## 2020-08-03 ENCOUNTER — Encounter: Payer: Self-pay | Admitting: Family Medicine

## 2020-08-03 ENCOUNTER — Ambulatory Visit (INDEPENDENT_AMBULATORY_CARE_PROVIDER_SITE_OTHER): Payer: No Typology Code available for payment source | Admitting: Family Medicine

## 2020-08-03 ENCOUNTER — Other Ambulatory Visit: Payer: Self-pay

## 2020-08-03 VITALS — BP 125/91 | HR 92 | Wt 149.8 lb

## 2020-08-03 DIAGNOSIS — M7989 Other specified soft tissue disorders: Secondary | ICD-10-CM

## 2020-08-03 DIAGNOSIS — I10 Essential (primary) hypertension: Secondary | ICD-10-CM | POA: Diagnosis not present

## 2020-08-03 DIAGNOSIS — E781 Pure hyperglyceridemia: Secondary | ICD-10-CM

## 2020-08-03 MED ORDER — HYDROCHLOROTHIAZIDE 25 MG PO TABS: 25.0000 mg | ORAL_TABLET | Freq: Every day | ORAL | 3 refills | Status: DC

## 2020-08-03 MED ORDER — ATORVASTATIN CALCIUM 40 MG PO TABS: 40.0000 mg | ORAL_TABLET | Freq: Every day | ORAL | 1 refills | Status: AC

## 2020-08-03 MED ORDER — ATORVASTATIN CALCIUM 40 MG PO TABS: 40.0000 mg | ORAL_TABLET | Freq: Every day | ORAL | 1 refills | Status: DC

## 2020-08-03 NOTE — Assessment & Plan Note (Signed)
Presumed gout in 07/2020, completely resolved.  If recurrent episode, instructed to start NSAIDs immediately.  Follow-up if recurrent, should consider switching antihypertensive.

## 2020-08-03 NOTE — Assessment & Plan Note (Signed)
Unable to afford atorvastatin/rosuvastatin via [facility].  Printed good Rx coupon for $8 atorvastatin at [facility].  Instructed to let me know if she has any difficulty with cost after this.

## 2020-08-03 NOTE — Patient Instructions (Signed)
Wonderful to see you today.  We will keep you on the HCTZ, please understand that this might increase your likelihood of having another gout flare.  If you do have another one, we should discuss trying to switch you over to avoid that.  I printed prescription for a cholesterol medication called atorvastatin.  You can take this to your nearest [facility] which should hopefully be significantly cheaper with this good Rx coupon.  Please let me know if you have any difficulty with cost.

## 2020-08-03 NOTE — Assessment & Plan Note (Signed)
Well-controlled on HCTZ.  She was unable to afford valsartan and prefers to stay with HCTZ.  She is aware that HCTZ may increase propensity for gout, however given this was her first presumed flare and she has tolerated this medication for several years, she would rather monitor for recurrent symptoms.  Monitor BP intermittently at home.

## 2020-11-05 ENCOUNTER — Other Ambulatory Visit: Payer: Self-pay

## 2020-11-05 ENCOUNTER — Emergency Department (HOSPITAL_COMMUNITY): Payer: No Typology Code available for payment source

## 2020-11-05 ENCOUNTER — Inpatient Hospital Stay (HOSPITAL_COMMUNITY)
Admission: EM | Admit: 2020-11-05 | Discharge: 2020-11-11 | DRG: 552 | Disposition: A | Discharge status: Home / Self Care | Payer: No Typology Code available for payment source | Attending: Surgery | Admitting: Surgery

## 2020-11-05 DIAGNOSIS — E876 Hypokalemia: Secondary | ICD-10-CM | POA: Diagnosis not present

## 2020-11-05 DIAGNOSIS — E781 Pure hyperglyceridemia: Secondary | ICD-10-CM | POA: Diagnosis present

## 2020-11-05 DIAGNOSIS — R52 Pain, unspecified: Secondary | ICD-10-CM

## 2020-11-05 DIAGNOSIS — S32039A Unspecified fracture of third lumbar vertebra, initial encounter for closed fracture: Secondary | ICD-10-CM | POA: Diagnosis present

## 2020-11-05 DIAGNOSIS — S12600A Unspecified displaced fracture of seventh cervical vertebra, initial encounter for closed fracture: Secondary | ICD-10-CM | POA: Diagnosis not present

## 2020-11-05 DIAGNOSIS — Y92410 Unspecified street and highway as the place of occurrence of the external cause: Secondary | ICD-10-CM

## 2020-11-05 DIAGNOSIS — I513 Intracardiac thrombosis, not elsewhere classified: Secondary | ICD-10-CM | POA: Diagnosis present

## 2020-11-05 DIAGNOSIS — S2239XA Fracture of one rib, unspecified side, initial encounter for closed fracture: Secondary | ICD-10-CM

## 2020-11-05 DIAGNOSIS — S22089A Unspecified fracture of T11-T12 vertebra, initial encounter for closed fracture: Secondary | ICD-10-CM | POA: Diagnosis present

## 2020-11-05 DIAGNOSIS — Z8249 Family history of ischemic heart disease and other diseases of the circulatory system: Secondary | ICD-10-CM

## 2020-11-05 DIAGNOSIS — I10 Essential (primary) hypertension: Secondary | ICD-10-CM | POA: Diagnosis present

## 2020-11-05 DIAGNOSIS — Z20822 Contact with and (suspected) exposure to covid-19: Secondary | ICD-10-CM | POA: Diagnosis present

## 2020-11-05 DIAGNOSIS — S32019A Unspecified fracture of first lumbar vertebra, initial encounter for closed fracture: Secondary | ICD-10-CM | POA: Diagnosis present

## 2020-11-05 DIAGNOSIS — F1010 Alcohol abuse, uncomplicated: Secondary | ICD-10-CM | POA: Diagnosis present

## 2020-11-05 DIAGNOSIS — Z9851 Tubal ligation status: Secondary | ICD-10-CM

## 2020-11-05 DIAGNOSIS — S12601A Unspecified nondisplaced fracture of seventh cervical vertebra, initial encounter for closed fracture: Secondary | ICD-10-CM

## 2020-11-05 DIAGNOSIS — S22009A Unspecified fracture of unspecified thoracic vertebra, initial encounter for closed fracture: Secondary | ICD-10-CM

## 2020-11-05 DIAGNOSIS — E785 Hyperlipidemia, unspecified: Secondary | ICD-10-CM | POA: Diagnosis present

## 2020-11-05 DIAGNOSIS — Y906 Blood alcohol level of 120-199 mg/100 ml: Secondary | ICD-10-CM | POA: Diagnosis present

## 2020-11-05 DIAGNOSIS — E871 Hypo-osmolality and hyponatremia: Secondary | ICD-10-CM | POA: Diagnosis not present

## 2020-11-05 DIAGNOSIS — S2231XA Fracture of one rib, right side, initial encounter for closed fracture: Secondary | ICD-10-CM | POA: Diagnosis present

## 2020-11-05 DIAGNOSIS — T502X5A Adverse effect of carbonic-anhydrase inhibitors, benzothiadiazides and other diuretics, initial encounter: Secondary | ICD-10-CM | POA: Diagnosis not present

## 2020-11-05 DIAGNOSIS — S32009A Unspecified fracture of unspecified lumbar vertebra, initial encounter for closed fracture: Secondary | ICD-10-CM

## 2020-11-05 DIAGNOSIS — S32029A Unspecified fracture of second lumbar vertebra, initial encounter for closed fracture: Secondary | ICD-10-CM | POA: Diagnosis present

## 2020-11-05 DIAGNOSIS — I951 Orthostatic hypotension: Secondary | ICD-10-CM | POA: Diagnosis not present

## 2020-11-05 DIAGNOSIS — Z79899 Other long term (current) drug therapy: Secondary | ICD-10-CM

## 2020-11-05 LAB — CBG MONITORING, ED: Glucose-Capillary: 148 mg/dL — ABNORMAL HIGH (ref 70–99)

## 2020-11-05 LAB — I-STAT CHEM 8, ED
BUN: 10 mg/dL (ref 6–20)
Calcium, Ion: 1.02 mmol/L — ABNORMAL LOW (ref 1.15–1.40)
Chloride: 104 mmol/L (ref 98–111)
Creatinine, Ser: 0.8 mg/dL (ref 0.44–1.00)
Glucose, Bld: 138 mg/dL — ABNORMAL HIGH (ref 70–99)
HCT: 37 % (ref 36.0–46.0)
Hemoglobin: 12.6 g/dL (ref 12.0–15.0)
Potassium: 3.8 mmol/L (ref 3.5–5.1)
Sodium: 139 mmol/L (ref 135–145)
TCO2: 26 mmol/L (ref 22–32)

## 2020-11-05 LAB — LACTIC ACID, PLASMA
Lactic Acid, Venous: 2.7 mmol/L (ref 0.5–1.9)
Lactic Acid, Venous: 5.1 mmol/L (ref 0.5–1.9)
Lactic Acid, Venous: 3.7 mmol/L (ref 0.5–1.9)

## 2020-11-05 LAB — COMPREHENSIVE METABOLIC PANEL
ALT: 41 U/L (ref 0–44)
AST: 140 U/L — ABNORMAL HIGH (ref 15–41)
Albumin: 3.7 g/dL (ref 3.5–5.0)
Alkaline Phosphatase: 57 U/L (ref 38–126)
Anion gap: 9 (ref 5–15)
BUN: 10 mg/dL (ref 6–20)
CO2: 25 mmol/L (ref 22–32)
Calcium: 8.8 mg/dL — ABNORMAL LOW (ref 8.9–10.3)
Chloride: 104 mmol/L (ref 98–111)
Creatinine, Ser: 0.66 mg/dL (ref 0.44–1.00)
GFR, Estimated: >60 mL/min (ref >60)
Glucose, Bld: 141 mg/dL — ABNORMAL HIGH (ref 70–99)
Potassium: 4.6 mmol/L (ref 3.5–5.1)
Sodium: 138 mmol/L (ref 135–145)
Total Bilirubin: 1.6 mg/dL — ABNORMAL HIGH (ref 0.3–1.2)
Total Protein: 6.5 g/dL (ref 6.5–8.1)

## 2020-11-05 LAB — CBC WITH DIFFERENTIAL/PLATELET
Abs Immature Granulocytes: 0.15 10*3/uL — ABNORMAL HIGH (ref 0.00–0.07)
Basophils Absolute: 0 10*3/uL (ref 0.0–0.1)
Basophils Relative: 0 %
Eosinophils Absolute: 0 10*3/uL (ref 0.0–0.5)
Eosinophils Relative: 1 %
HCT: 36.6 % (ref 36.0–46.0)
Hemoglobin: 12.4 g/dL (ref 12.0–15.0)
Immature Granulocytes: 2 %
Lymphocytes Relative: 34 %
Lymphs Abs: 2.5 10*3/uL (ref 0.7–4.0)
MCH: 33 pg (ref 26.0–34.0)
MCHC: 33.9 g/dL (ref 30.0–36.0)
MCV: 97.3 fL (ref 80.0–100.0)
Monocytes Absolute: 0.3 10*3/uL (ref 0.1–1.0)
Monocytes Relative: 4 %
Neutro Abs: 4.4 10*3/uL (ref 1.7–7.7)
Neutrophils Relative %: 59 %
Platelets: 204 10*3/uL (ref 150–400)
RBC: 3.76 MIL/uL — ABNORMAL LOW (ref 3.87–5.11)
RDW: 14 % (ref 11.5–15.5)
WBC: 7.4 10*3/uL (ref 4.0–10.5)
nRBC: 0 % (ref 0.0–0.2)

## 2020-11-05 LAB — RESP PANEL BY RT-PCR (FLU A&B, COVID) ARPGX2
Influenza A by PCR: NEGATIVE
Influenza B by PCR: NEGATIVE
SARS Coronavirus 2 by RT PCR: NEGATIVE

## 2020-11-05 LAB — PROTIME-INR
INR: 1.2 (ref 0.8–1.2)
Prothrombin Time: 15.1 seconds (ref 11.4–15.2)

## 2020-11-05 LAB — ETHANOL: Alcohol, Ethyl (B): 145 mg/dL — ABNORMAL HIGH (ref <10)

## 2020-11-05 LAB — APTT: aPTT: 29 seconds (ref 24–36)
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
## 2020-11-05 IMAGING — DX DG CHEST 1V PORT
1 series · 1 of 1 positions shown · non-contrast
Comparison: None.

CLINICAL DATA: Hypoxia

EXAM:
PORTABLE CHEST 1 VIEW

[chest]
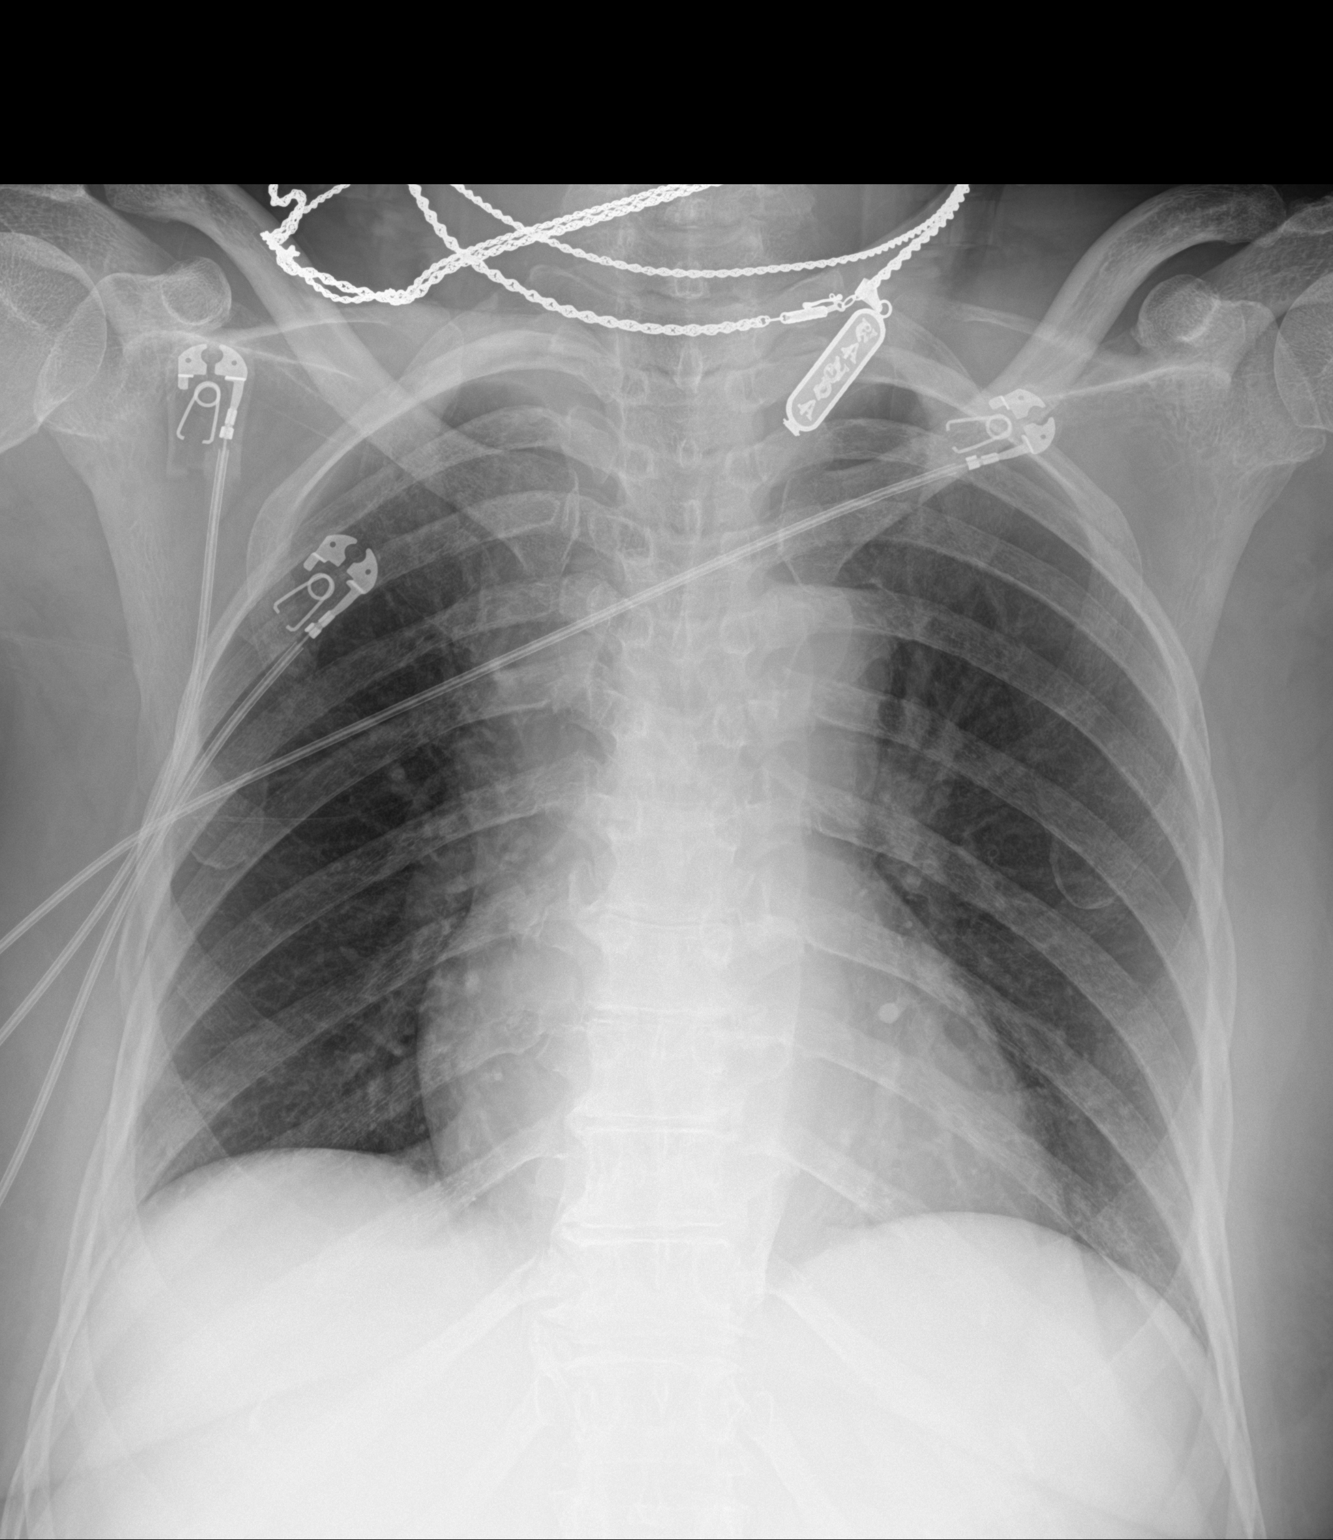

[1 of 1 positions shown; findings below may reference images not displayed]

FINDINGS: Apparent enlargement of the cardiac and mediastinal contours is
likely related to AP technique. Lungs are clear. No large pleural
effusion or evidence of pneumothorax. Right lateral rib fracture.
IMPRESSION: Clear lungs with no evidence of pleural effusion or pneumothorax.

Right lateral rib fracture.
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
## 2020-11-05 MED ORDER — FENTANYL CITRATE PF 50 MCG/ML IJ SOSY
25.0000 ug | PREFILLED_SYRINGE | Freq: Once | INTRAMUSCULAR | Status: DC
Start: 2020-11-05 — End: 2020-11-05

## 2020-11-05 MED ORDER — ONDANSETRON HCL 4 MG/2ML IJ SOLN
INTRAMUSCULAR | Status: AC
  Filled 2020-11-05: qty 2

## 2020-11-05 MED ORDER — ACETAMINOPHEN 500 MG PO TABS: 500.0000 mg | ORAL_TABLET | Freq: Four times a day (QID) | ORAL | 0 refills | Status: AC | PRN

## 2020-11-05 MED ORDER — FENTANYL CITRATE PF 50 MCG/ML IJ SOSY
50.0000 ug | PREFILLED_SYRINGE | Freq: Once | INTRAMUSCULAR | Status: AC
Start: 2020-11-05 — End: 2020-11-05
  Administered 2020-11-05: 50 ug via INTRAVENOUS
  Filled 2020-11-05: qty 1

## 2020-11-05 MED ORDER — OXYCODONE-ACETAMINOPHEN 5-325 MG PO TABS: 1.0000 | ORAL_TABLET | Freq: Three times a day (TID) | ORAL | 0 refills | Status: DC | PRN

## 2020-11-05 MED ORDER — LACTATED RINGERS IV BOLUS
1000.0000 mL | Freq: Once | INTRAVENOUS | Status: AC
  Administered 2020-11-05: 1000 mL via INTRAVENOUS

## 2020-11-05 MED ORDER — OXYCODONE-ACETAMINOPHEN 5-325 MG PO TABS
1.0000 | ORAL_TABLET | Freq: Once | ORAL | Status: AC
  Administered 2020-11-05: 1 via ORAL
  Filled 2020-11-05: qty 1

## 2020-11-05 MED ORDER — ONDANSETRON HCL 4 MG/2ML IJ SOLN
INTRAMUSCULAR | Status: AC | PRN
  Administered 2020-11-05: 4 mg via INTRAVENOUS

## 2020-11-05 MED ORDER — IOHEXOL 300 MG/ML  SOLN
100.0000 mL | Freq: Once | INTRAMUSCULAR | Status: AC | PRN
  Administered 2020-11-05: 100 mL via INTRAVENOUS

## 2020-11-05 MED ORDER — METHOCARBAMOL 500 MG PO TABS: 500.0000 mg | ORAL_TABLET | Freq: Two times a day (BID) | ORAL | 0 refills | Status: DC

## 2020-11-05 MED ORDER — SODIUM CHLORIDE 0.9 % IV BOLUS
1000.0000 mL | Freq: Once | INTRAVENOUS | Status: AC
  Administered 2020-11-05: 1000 mL via INTRAVENOUS

## 2020-11-05 MED ORDER — ONDANSETRON HCL 4 MG/2ML IJ SOLN: 4.0000 mg | Freq: Once | INTRAMUSCULAR | Status: DC

## 2020-11-05 NOTE — Progress Notes (Signed)
Orthopedic Tech Progress Note Patient Details:  Karen Robertson 1960-03-09 [id number]  Ortho Devices Ortho Device/Splint Location: TLSO applied Ortho Device/Splint Interventions: Ordered, Application, Adjustment   Post Interventions Patient Tolerated: Well Instructions Provided: Care of device, Adjustment of device  [name] 11/05/2020, 9:04 PM

## 2020-11-05 NOTE — ED Notes (Signed)
When attempting to discharge patient she was dizzy, lightheaded and felt like she was going to fall backwards. She is also still c/o significant pain 9/10 throughout her neck and back and lesser pain to right shoulder and left arm. I did attempt walking her twice, she was able to ambulate 8 feet but difficult with abnormal and unsteady gait. Prior SBP to getting up was 161 and on repeat while sitting SBP 129.  Orthostatic sitting check prior to getting patient back in bed. BP 143/90 with HR 90 Standing BP 118/78, HR 78

## 2020-11-05 NOTE — ED Notes (Signed)
Pt given and educated on incentive spirometer. Pt demonstrated understanding.
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
## 2020-11-05 NOTE — ED Notes (Addendum)
Dr. Kathrynn Humble notified of Lactic Acid of 5.1 increased from prior 2.7 at 15:09. She has not received IV fluid at this time per chart review.
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
## 2020-11-05 NOTE — ED Notes (Signed)
Called ortho for TLSO brace
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
## 2020-11-05 NOTE — ED Notes (Signed)
SpO2 intermittently drops to 88-90% on RA, patient placed back on 2L O2 via [place] and SpO2 96-97%.
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
## 2020-11-05 NOTE — ED Notes (Signed)
Patient transported to CT with nurse assist

## 2020-11-06 ENCOUNTER — Encounter (HOSPITAL_COMMUNITY): Payer: Self-pay

## 2020-11-06 ENCOUNTER — Inpatient Hospital Stay (HOSPITAL_COMMUNITY): Payer: No Typology Code available for payment source

## 2020-11-06 DIAGNOSIS — I513 Intracardiac thrombosis, not elsewhere classified: Secondary | ICD-10-CM | POA: Diagnosis present

## 2020-11-06 DIAGNOSIS — T502X5A Adverse effect of carbonic-anhydrase inhibitors, benzothiadiazides and other diuretics, initial encounter: Secondary | ICD-10-CM | POA: Diagnosis not present

## 2020-11-06 DIAGNOSIS — I10 Essential (primary) hypertension: Secondary | ICD-10-CM | POA: Diagnosis present

## 2020-11-06 DIAGNOSIS — E871 Hypo-osmolality and hyponatremia: Secondary | ICD-10-CM | POA: Diagnosis not present

## 2020-11-06 DIAGNOSIS — S12600A Unspecified displaced fracture of seventh cervical vertebra, initial encounter for closed fracture: Secondary | ICD-10-CM | POA: Diagnosis present

## 2020-11-06 DIAGNOSIS — I951 Orthostatic hypotension: Secondary | ICD-10-CM | POA: Diagnosis not present

## 2020-11-06 DIAGNOSIS — S32029A Unspecified fracture of second lumbar vertebra, initial encounter for closed fracture: Secondary | ICD-10-CM | POA: Diagnosis present

## 2020-11-06 DIAGNOSIS — R008 Other abnormalities of heart beat: Secondary | ICD-10-CM | POA: Diagnosis not present

## 2020-11-06 DIAGNOSIS — Y92410 Unspecified street and highway as the place of occurrence of the external cause: Secondary | ICD-10-CM | POA: Diagnosis not present

## 2020-11-06 DIAGNOSIS — R52 Pain, unspecified: Secondary | ICD-10-CM | POA: Diagnosis present

## 2020-11-06 DIAGNOSIS — Z20822 Contact with and (suspected) exposure to covid-19: Secondary | ICD-10-CM | POA: Diagnosis present

## 2020-11-06 DIAGNOSIS — S2231XA Fracture of one rib, right side, initial encounter for closed fracture: Secondary | ICD-10-CM | POA: Diagnosis present

## 2020-11-06 DIAGNOSIS — E876 Hypokalemia: Secondary | ICD-10-CM | POA: Diagnosis not present

## 2020-11-06 DIAGNOSIS — S32039A Unspecified fracture of third lumbar vertebra, initial encounter for closed fracture: Secondary | ICD-10-CM | POA: Diagnosis present

## 2020-11-06 DIAGNOSIS — I5189 Other ill-defined heart diseases: Secondary | ICD-10-CM | POA: Diagnosis not present

## 2020-11-06 DIAGNOSIS — Z9851 Tubal ligation status: Secondary | ICD-10-CM | POA: Diagnosis not present

## 2020-11-06 DIAGNOSIS — Z79899 Other long term (current) drug therapy: Secondary | ICD-10-CM | POA: Diagnosis not present

## 2020-11-06 DIAGNOSIS — F1010 Alcohol abuse, uncomplicated: Secondary | ICD-10-CM | POA: Diagnosis present

## 2020-11-06 DIAGNOSIS — E785 Hyperlipidemia, unspecified: Secondary | ICD-10-CM | POA: Diagnosis present

## 2020-11-06 DIAGNOSIS — S32019A Unspecified fracture of first lumbar vertebra, initial encounter for closed fracture: Secondary | ICD-10-CM | POA: Diagnosis present

## 2020-11-06 DIAGNOSIS — Y906 Blood alcohol level of 120-199 mg/100 ml: Secondary | ICD-10-CM | POA: Diagnosis present

## 2020-11-06 DIAGNOSIS — S22089A Unspecified fracture of T11-T12 vertebra, initial encounter for closed fracture: Secondary | ICD-10-CM | POA: Diagnosis present

## 2020-11-06 DIAGNOSIS — Z8249 Family history of ischemic heart disease and other diseases of the circulatory system: Secondary | ICD-10-CM | POA: Diagnosis not present

## 2020-11-06 DIAGNOSIS — E781 Pure hyperglyceridemia: Secondary | ICD-10-CM | POA: Diagnosis present

## 2020-11-06 LAB — ECHOCARDIOGRAM COMPLETE
Area-P 1/2: 3.48 cm2
Height: 67 in
S' Lateral: 2.6 cm
Weight: 2395.08 oz

## 2020-11-06 MED ORDER — ONDANSETRON HCL 4 MG/2ML IJ SOLN: 4.0000 mg | Freq: Four times a day (QID) | INTRAMUSCULAR | Status: DC | PRN

## 2020-11-06 MED ORDER — HYDROCHLOROTHIAZIDE 25 MG PO TABS
25.0000 mg | ORAL_TABLET | Freq: Every day | ORAL | Status: DC
  Administered 2020-11-06 – 2020-11-08 (×3): 25 mg via ORAL
  Filled 2020-11-06 (×3): qty 1

## 2020-11-06 MED ORDER — ATORVASTATIN CALCIUM 10 MG PO TABS
20.0000 mg | ORAL_TABLET | Freq: Every day | ORAL | Status: DC
  Administered 2020-11-06 – 2020-11-11 (×6): 20 mg via ORAL
  Filled 2020-11-06 (×6): qty 2

## 2020-11-06 MED ORDER — DOCUSATE SODIUM 100 MG PO CAPS
100.0000 mg | ORAL_CAPSULE | Freq: Two times a day (BID) | ORAL | Status: DC
  Administered 2020-11-06 – 2020-11-08 (×6): 100 mg via ORAL
  Filled 2020-11-06 (×8): qty 1

## 2020-11-06 MED ORDER — ADULT MULTIVITAMIN W/MINERALS CH
1.0000 | ORAL_TABLET | Freq: Every day | ORAL | Status: DC
  Administered 2020-11-06 – 2020-11-11 (×6): 1 via ORAL
  Filled 2020-11-06 (×6): qty 1

## 2020-11-06 MED ORDER — HYDROMORPHONE HCL 1 MG/ML IJ SOLN
0.5000 mg | INTRAMUSCULAR | Status: DC | PRN
  Administered 2020-11-06: 0.5 mg via INTRAVENOUS
  Filled 2020-11-06: qty 1

## 2020-11-06 MED ORDER — HYDROMORPHONE HCL 1 MG/ML IJ SOLN: 0.5000 mg | INTRAMUSCULAR | Status: DC | PRN

## 2020-11-06 MED ORDER — OXYCODONE HCL 5 MG PO TABS
5.0000 mg | ORAL_TABLET | ORAL | Status: DC | PRN
  Administered 2020-11-06 – 2020-11-07 (×2): 5 mg via ORAL
  Administered 2020-11-08: 10 mg via ORAL
  Administered 2020-11-08 (×2): 5 mg via ORAL
  Administered 2020-11-09 – 2020-11-11 (×11): 10 mg via ORAL
  Filled 2020-11-06: qty 2
  Filled 2020-11-06: qty 1
  Filled 2020-11-06: qty 2
  Filled 2020-11-06: qty 1
  Filled 2020-11-06 (×2): qty 2
  Filled 2020-11-06: qty 1
  Filled 2020-11-06 (×2): qty 2
  Filled 2020-11-06: qty 1
  Filled 2020-11-06 (×7): qty 2

## 2020-11-06 MED ORDER — IBUPROFEN 600 MG PO TABS: 600.0000 mg | ORAL_TABLET | Freq: Four times a day (QID) | ORAL | Status: DC | PRN

## 2020-11-06 MED ORDER — HYDRALAZINE HCL 20 MG/ML IJ SOLN: 10.0000 mg | INTRAMUSCULAR | Status: DC | PRN

## 2020-11-06 MED ORDER — ONDANSETRON 4 MG PO TBDP: 4.0000 mg | ORAL_TABLET | Freq: Four times a day (QID) | ORAL | Status: DC | PRN

## 2020-11-06 MED ORDER — LORAZEPAM 2 MG/ML IJ SOLN: 1.0000 mg | INTRAMUSCULAR | Status: AC | PRN

## 2020-11-06 MED ORDER — THIAMINE HCL 100 MG PO TABS
100.0000 mg | ORAL_TABLET | Freq: Every day | ORAL | Status: DC
  Administered 2020-11-06 – 2020-11-11 (×6): 100 mg via ORAL
  Filled 2020-11-06 (×6): qty 1

## 2020-11-06 MED ORDER — LORAZEPAM 1 MG PO TABS: 1.0000 mg | ORAL_TABLET | ORAL | Status: AC | PRN

## 2020-11-06 MED ORDER — METHOCARBAMOL 500 MG PO TABS
500.0000 mg | ORAL_TABLET | Freq: Four times a day (QID) | ORAL | Status: DC
  Administered 2020-11-06 – 2020-11-08 (×8): 500 mg via ORAL
  Filled 2020-11-06 (×8): qty 1

## 2020-11-06 MED ORDER — ENOXAPARIN SODIUM 30 MG/0.3ML IJ SOSY
30.0000 mg | PREFILLED_SYRINGE | Freq: Two times a day (BID) | INTRAMUSCULAR | Status: DC
  Administered 2020-11-07 – 2020-11-08 (×3): 30 mg via SUBCUTANEOUS
  Filled 2020-11-06 (×3): qty 0.3

## 2020-11-06 MED ORDER — FOLIC ACID 1 MG PO TABS
1.0000 mg | ORAL_TABLET | Freq: Every day | ORAL | Status: DC
  Administered 2020-11-06 – 2020-11-11 (×6): 1 mg via ORAL
  Filled 2020-11-06 (×6): qty 1

## 2020-11-06 MED ORDER — OXYCODONE HCL 5 MG PO TABS
5.0000 mg | ORAL_TABLET | Freq: Four times a day (QID) | ORAL | Status: DC | PRN
  Administered 2020-11-06: 10 mg via ORAL
  Filled 2020-11-06: qty 2

## 2020-11-06 MED ORDER — ACETAMINOPHEN 500 MG PO TABS
1000.0000 mg | ORAL_TABLET | Freq: Four times a day (QID) | ORAL | Status: DC
  Administered 2020-11-06 – 2020-11-11 (×20): 1000 mg via ORAL
  Filled 2020-11-06 (×21): qty 2

## 2020-11-06 MED ORDER — FLUTICASONE PROPIONATE 50 MCG/ACT NA SUSP: 2.0000 | Freq: Every day | NASAL | Status: DC | PRN

## 2020-11-06 MED ORDER — THIAMINE HCL 100 MG/ML IJ SOLN
100.0000 mg | Freq: Every day | INTRAMUSCULAR | Status: DC
  Filled 2020-11-06: qty 2

## 2020-11-06 NOTE — ED Notes (Signed)
Placed Breakfast order 
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
## 2020-11-06 NOTE — ED Notes (Signed)
ECHO in progress- 
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
## 2020-11-06 NOTE — ED Notes (Signed)
RN spoke with pt's daughter, Harrell Gave, on the phone for update on pt.

## 2020-11-06 NOTE — ED Notes (Signed)
Echo at bedside
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
## 2020-11-06 NOTE — ED Notes (Signed)
Pt sleeping o2 sat 87-88% on RA. Placed pt on 2 lt. 98%

## 2020-11-06 NOTE — ED Notes (Signed)
PT in progress.

## 2020-11-06 NOTE — Progress Notes (Signed)
*  PRELIMINARY RESULTS* Echocardiogram 2D Echocardiogram has been performed.  Karen Robertson 11/06/2020, 2:07 PM

## 2020-11-07 DIAGNOSIS — I10 Essential (primary) hypertension: Secondary | ICD-10-CM | POA: Diagnosis not present

## 2020-11-07 DIAGNOSIS — I5189 Other ill-defined heart diseases: Secondary | ICD-10-CM

## 2020-11-07 LAB — BASIC METABOLIC PANEL
Anion gap: 14 (ref 5–15)
BUN: 9 mg/dL (ref 6–20)
CO2: 29 mmol/L (ref 22–32)
Calcium: 9.2 mg/dL (ref 8.9–10.3)
Chloride: 89 mmol/L — ABNORMAL LOW (ref 98–111)
Creatinine, Ser: 0.66 mg/dL (ref 0.44–1.00)
GFR, Estimated: >60 mL/min (ref >60)
Glucose, Bld: 136 mg/dL — ABNORMAL HIGH (ref 70–99)
Potassium: 2.8 mmol/L — ABNORMAL LOW (ref 3.5–5.1)
Sodium: 132 mmol/L — ABNORMAL LOW (ref 135–145)

## 2020-11-07 LAB — HIV ANTIBODY (ROUTINE TESTING W REFLEX): HIV Screen 4th Generation wRfx: NONREACTIVE

## 2020-11-07 MED ORDER — SODIUM CHLORIDE 0.9 % IV BOLUS
500.0000 mL | Freq: Once | INTRAVENOUS | Status: AC
  Administered 2020-11-07: 500 mL via INTRAVENOUS

## 2020-11-07 MED ORDER — HYDROMORPHONE HCL 1 MG/ML IJ SOLN
0.5000 mg | INTRAMUSCULAR | Status: DC | PRN
  Administered 2020-11-09: 0.5 mg via INTRAVENOUS
  Filled 2020-11-07: qty 0.5

## 2020-11-07 NOTE — Progress Notes (Signed)
Orthopedic Tech Progress Note Patient Details:  Karen Robertson 15-Jun-1960 [id number]  Patient ID: [name], female   DOB: 12/11/1960, 60 y.o.   MRN: [id number]  Adjusted TLSO because it was slightly too high. Vernona Rieger 11/07/2020, 7:34 PM
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
## 2020-11-08 ENCOUNTER — Inpatient Hospital Stay (HOSPITAL_COMMUNITY): Payer: No Typology Code available for payment source

## 2020-11-08 DIAGNOSIS — I5189 Other ill-defined heart diseases: Secondary | ICD-10-CM | POA: Diagnosis not present

## 2020-11-08 DIAGNOSIS — I10 Essential (primary) hypertension: Secondary | ICD-10-CM | POA: Diagnosis not present

## 2020-11-08 DIAGNOSIS — I513 Intracardiac thrombosis, not elsewhere classified: Secondary | ICD-10-CM

## 2020-11-08 LAB — CBC
HCT: 33.6 % — ABNORMAL LOW (ref 36.0–46.0)
Hemoglobin: 11.6 g/dL — ABNORMAL LOW (ref 12.0–15.0)
MCH: 32.9 pg (ref 26.0–34.0)
MCHC: 34.5 g/dL (ref 30.0–36.0)
MCV: 95.2 fL (ref 80.0–100.0)
Platelets: 151 10*3/uL (ref 150–400)
RBC: 3.53 MIL/uL — ABNORMAL LOW (ref 3.87–5.11)
RDW: 13.5 % (ref 11.5–15.5)
WBC: 6.1 10*3/uL (ref 4.0–10.5)
nRBC: 0 % (ref 0.0–0.2)

## 2020-11-08 LAB — BASIC METABOLIC PANEL
Anion gap: 11 (ref 5–15)
BUN: 8 mg/dL (ref 6–20)
CO2: 33 mmol/L — ABNORMAL HIGH (ref 22–32)
Calcium: 9.2 mg/dL (ref 8.9–10.3)
Chloride: 87 mmol/L — ABNORMAL LOW (ref 98–111)
Creatinine, Ser: 0.65 mg/dL (ref 0.44–1.00)
GFR, Estimated: >60 mL/min (ref >60)
Glucose, Bld: 105 mg/dL — ABNORMAL HIGH (ref 70–99)
Potassium: 2.8 mmol/L — ABNORMAL LOW (ref 3.5–5.1)
Sodium: 131 mmol/L — ABNORMAL LOW (ref 135–145)

## 2020-11-08 LAB — MAGNESIUM: Magnesium: 2.1 mg/dL (ref 1.7–2.4)

## 2020-11-08 IMAGING — MR MR CARD MORPHOLOGY WO/W CM
45 of 48 series · 45 of 48 positions shown · IV contrast (gadavist)
Comparison: none

CLINICAL DATA: Evaluate right atrial mass

EXAM:
CARDIAC MRI
TECHNIQUE: The patient was scanned on a 1.5 Tesla Siemens magnet. A dedicated
cardiac coil was used. Functional imaging was done using Fiesta
sequences. [DATE], and 4 chamber views were done to assess for RWMA's.
Modified MARNI rule using a short axis stack was used to
calculate an ejection fraction on a dedicated work station using
Circle software. The patient received 7 cc of Gadavist. After 10
minutes inversion recovery sequences were used to assess for
infiltration and scar tissue.
CONTRAST:  7 cc  of Gadavist

[Series 4: [id number] · axial · 8.0mm · 1.41mm/px · 1 of 16 slices shown]
[im 1/16]
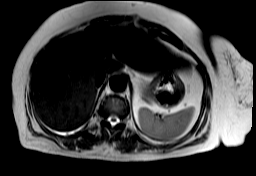

[Series 8: bSSFP · oblique · 8.0mm · 1.61mm/px · 1 of 25 slices shown (1 of 43)]
[im 1/25]
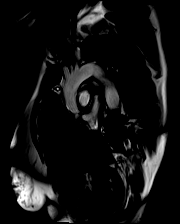

[Series 9: bSSFP · oblique · 8.0mm · 1.61mm/px · 1 of 25 slices shown (2 of 43)]
[im 1/25]
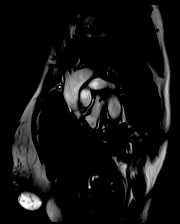

[Series 10: bSSFP · oblique · 8.0mm · 1.61mm/px · 1 of 25 slices shown (3 of 43)]
[im 1/25]
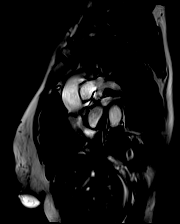

[Series 11: bSSFP · oblique · 8.0mm · 1.61mm/px · 1 of 25 slices shown (4 of 43)]
[im 1/25]
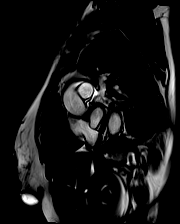

[Series 12: bSSFP · oblique · 8.0mm · 1.61mm/px · 1 of 25 slices shown (5 of 43)]
[im 1/25]
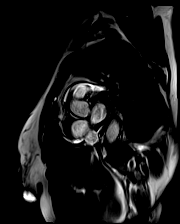

[Series 13: bSSFP · oblique · 8.0mm · 1.61mm/px · 1 of 25 slices shown (6 of 43)]
[im 1/25]
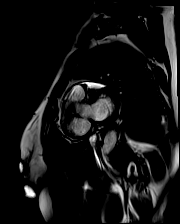

[Series 14: bSSFP · oblique · 8.0mm · 1.61mm/px · 1 of 25 slices shown (7 of 43)]
[im 1/25]
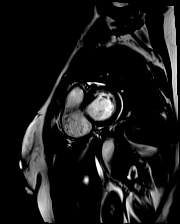

[Series 15: bSSFP · oblique · 8.0mm · 1.61mm/px · 1 of 25 slices shown (8 of 43)]
[im 1/25]
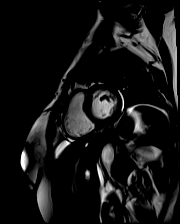

[Series 16: bSSFP · oblique · 8.0mm · 1.61mm/px · 1 of 25 slices shown (9 of 43)]
[im 1/25]
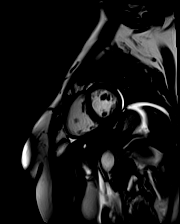

[Series 17: bSSFP · oblique · 8.0mm · 1.61mm/px · 1 of 25 slices shown (10 of 43)]
[im 1/25]
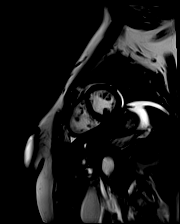

[Series 18: bSSFP · oblique · 8.0mm · 1.61mm/px · 1 of 25 slices shown (11 of 43)]
[im 1/25]
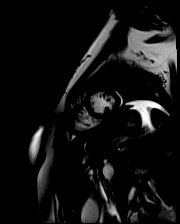

[Series 19: bSSFP · oblique · 8.0mm · 1.61mm/px · 1 of 25 slices shown (12 of 43)]
[im 1/25]
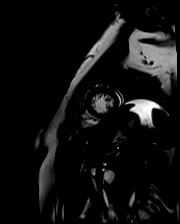

[Series 20: bSSFP · oblique · 8.0mm · 1.61mm/px · 1 of 25 slices shown (13 of 43)]
[im 1/25]
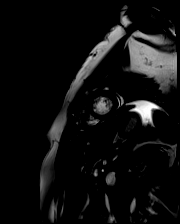

[Series 21: bSSFP · oblique · 8.0mm · 1.61mm/px · 1 of 25 slices shown (14 of 43)]
[im 1/25]
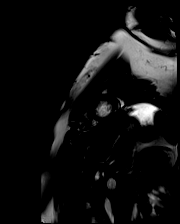

[Series 22: bSSFP · oblique · 8.0mm · 1.61mm/px · 1 of 25 slices shown (15 of 43)]
[im 1/25]
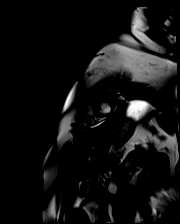

[Series 23: bSSFP · oblique · 8.0mm · 1.61mm/px · 1 of 25 slices shown (16 of 43)]
[im 1/25]
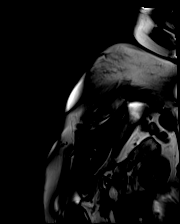

[Series 24: bSSFP · oblique · 8.0mm · 1.61mm/px · 1 of 25 slices shown (17 of 43)]
[im 1/25]
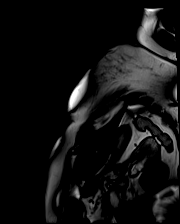

[Series 25: bSSFP · axial · 6.0mm · 1.61mm/px · 1 of 25 slices shown (18 of 43)]
[im 1/25]
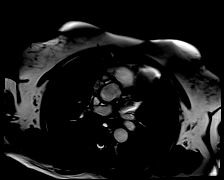

[Series 25: bSSFP · axial · 6.0mm · 1.61mm/px · 1 of 25 slices shown (19 of 43)]
[im 1/25]
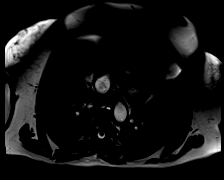

[Series 25: bSSFP · axial · 6.0mm · 1.61mm/px · 1 of 25 slices shown (20 of 43)]
[im 1/25]
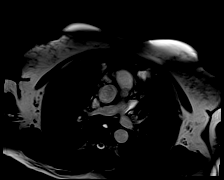

[Series 25: bSSFP · axial · 6.0mm · 1.61mm/px · 1 of 25 slices shown (21 of 43)]
[im 1/25]
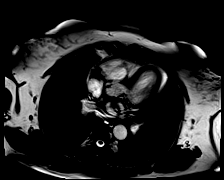

[Series 25: bSSFP · axial · 6.0mm · 1.61mm/px · 1 of 25 slices shown (22 of 43)]
[im 1/25]
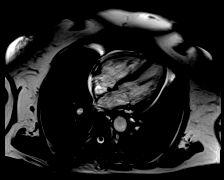

[Series 25: bSSFP · axial · 6.0mm · 1.61mm/px · 1 of 25 slices shown (23 of 43)]
[im 1/25]
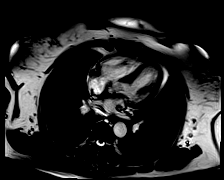

[Series 25: bSSFP · axial · 6.0mm · 1.61mm/px · 1 of 25 slices shown (24 of 43)]
[im 1/25]
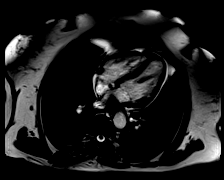

[Series 25: bSSFP · axial · 6.0mm · 1.61mm/px · 1 of 25 slices shown (25 of 43)]
[im 1/25]
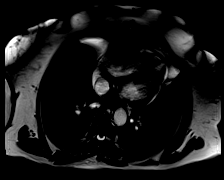

[Series 25: bSSFP · axial · 6.0mm · 1.61mm/px · 1 of 25 slices shown (26 of 43)]
[im 1/25]
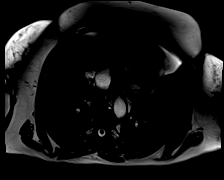

[Series 25: bSSFP · axial · 6.0mm · 1.61mm/px · 1 of 25 slices shown (27 of 43)]
[im 1/25]
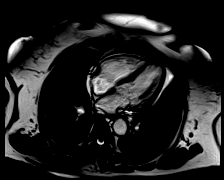

[Series 25: bSSFP · axial · 6.0mm · 1.61mm/px · 1 of 25 slices shown (28 of 43)]
[im 1/25]
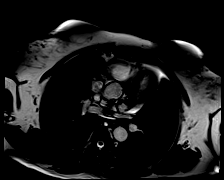

[Series 25: bSSFP · axial · 6.0mm · 1.61mm/px · 1 of 25 slices shown (29 of 43)]
[im 1/25]
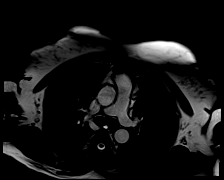

[Series 26: bSSFP · oblique · 6.0mm · 1.41mm/px · 1 of 25 slices shown (30 of 43)]
[im 1/25]
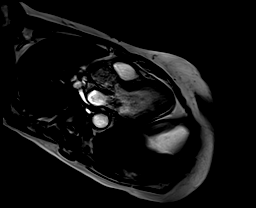

[Series 27: bSSFP · oblique · 6.0mm · 1.41mm/px · 1 of 25 slices shown (31 of 43)]
[im 1/25]
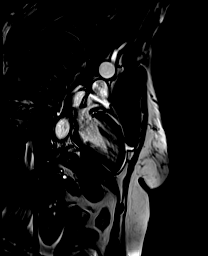

[Series 28: bSSFP · axial · 6.0mm · 1.41mm/px · 1 of 25 slices shown (32 of 43)]
[im 1/25]
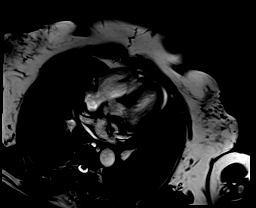

[Series 29: bSSFP · axial · 5.0mm · 1.52mm/px · 1 of 25 slices shown (33 of 43)]
[im 1/25]
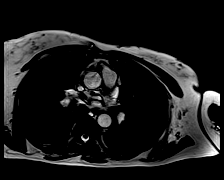

[Series 29: bSSFP · axial · 5.0mm · 1.52mm/px · 1 of 25 slices shown (34 of 43)]
[im 1/25]
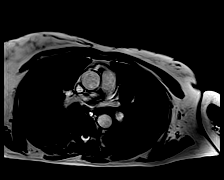

[Series 29: bSSFP · axial · 5.0mm · 1.52mm/px · 1 of 25 slices shown (35 of 43)]
[im 1/25]
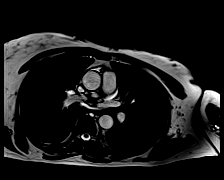

[Series 29: bSSFP · axial · 5.0mm · 1.52mm/px · 1 of 25 slices shown (36 of 43)]
[im 1/25]
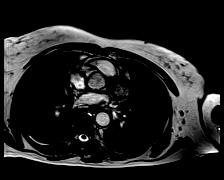

[Series 29: bSSFP · axial · 5.0mm · 1.52mm/px · 1 of 25 slices shown (37 of 43)]
[im 1/25]
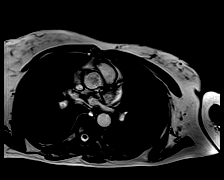

[Series 29: bSSFP · axial · 5.0mm · 1.52mm/px · 1 of 25 slices shown (38 of 43)]
[im 1/25]
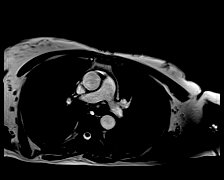

[Series 29: bSSFP · axial · 5.0mm · 1.52mm/px · 1 of 25 slices shown (39 of 43)]
[im 1/25]
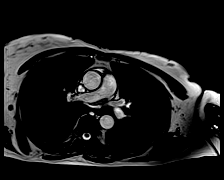

[Series 29: bSSFP · axial · 5.0mm · 1.52mm/px · 1 of 25 slices shown (40 of 43)]
[im 1/25]
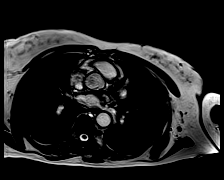

[Series 30: bSSFP · axial · 5.0mm · 1.52mm/px · 1 of 25 slices shown (41 of 43)]
[im 1/25]
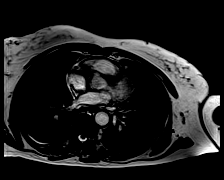

[Series 30: bSSFP · axial · 5.0mm · 1.52mm/px · 1 of 25 slices shown (42 of 43)]
[im 1/25]
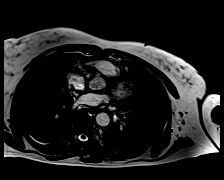

[Series 30: bSSFP · axial · 5.0mm · 1.52mm/px · 1 of 25 slices shown (43 of 43)]
[im 1/25]
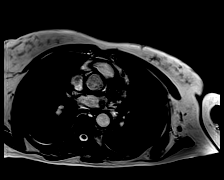

[Series 31: (id)_long_t1 · oblique · 8.0mm · 1.56mm/px · 1 of 24 slices shown]
[im 1/24]
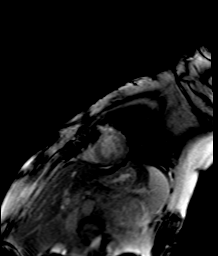

[45 of 48 positions shown; findings below may reference images not displayed]

FINDINGS: Left ventricle:

-Normal size

-Normal wall thickness

-Hyperdynamic systolic function

-Normal ECV (26%)

-No LGE

LV EF:  72% (Normal 56-78%)

Absolute volumes:

LV EDV: 90mL (Normal 52-141 mL)

LV ESV: 25mL (Normal 13-51 mL)

LV SV: 65mL (Normal 33-97 mL)

CO: 5.4L/min (Normal 2.7-6.0 L/min)

Indexed volumes:

LV EDV: 50mL/sq-m (Normal 41-81 mL/sq-m)

LV ESV: 14mL/sq-m (Normal 12-21 mL/sq-m)

LV SV: 36mL/sq-m (Normal 26-56 mL/sq-m)

CI: 3.0L/min/sq-m (Normal 1.8-3.8 L/min/sq-m)

Right ventricle: Normal size and systolic function

RV EF: 64% (Normal 47-80%)

Absolute volumes:

RV EDV: 97mL (Normal 58-154 mL)

RV ESV: 35mL (Normal 12-68 mL)

RV SV: 61mL (Normal 35-98 mL)

CO: 5.1L/min (Normal 2.7-6 L/min)

Indexed volumes:

RV EDV: 54mL/sq-m (Normal 48-87 mL/sq-m)

RV ESV: 20mL/sq-m (Normal 11-28 mL/sq-m)

RV SV: 34mL/sq-m (Normal 27-57 mL/sq-m)

CI: 2.8L/min/sq-m (Normal 1.8-3.8 L/min/sq-m)

Left atrium: Normal size

Right atrium: Normal size. Mass at junction of SVC and right atrium
measuring 13mm x 12mm. Not well seen on cine images but seen on LGE
imaging with no contrast uptake. Consistent with thrombus.

Mitral valve: Trivial regurgitation

Aortic valve: Tricuspid. No regurgitation

Tricuspid valve: Trivial regurgitation

Pulmonic valve: No regurgitation

Aorta: Normal proximal ascending aorta

Pericardium: Normal
IMPRESSION: 1. Mass at junction of SVC and right atrium measuring 13mm x 12mm.
Not well seen on cine images but seen on LGE imaging with no
contrast uptake. Consistent with thrombus.

2. Normal LV size, normal wall thickness, and hyperdynamic systolic
function (EF 72%)

3.  Normal RV size and systolic function (EF 64%)

4.  No LGE to suggest myocardial scar

## 2020-11-08 MED ORDER — POTASSIUM CHLORIDE CRYS ER 20 MEQ PO TBCR
40.0000 meq | EXTENDED_RELEASE_TABLET | Freq: Two times a day (BID) | ORAL | Status: AC
  Administered 2020-11-08 (×2): 40 meq via ORAL
  Filled 2020-11-08 (×2): qty 2

## 2020-11-08 MED ORDER — APIXABAN 5 MG PO TABS
10.0000 mg | ORAL_TABLET | Freq: Two times a day (BID) | ORAL | Status: DC
Start: 2020-11-08 — End: 2020-11-12
  Administered 2020-11-08 – 2020-11-11 (×6): 10 mg via ORAL
  Filled 2020-11-08 (×7): qty 2

## 2020-11-08 MED ORDER — GADOBUTROL 1 MMOL/ML IV SOLN
7.0000 mL | Freq: Once | INTRAVENOUS | Status: AC | PRN
  Administered 2020-11-08: 7 mL via INTRAVENOUS

## 2020-11-08 MED ORDER — APIXABAN 5 MG PO TABS: 5.0000 mg | ORAL_TABLET | Freq: Two times a day (BID) | ORAL | Status: DC

## 2020-11-08 MED ORDER — METHOCARBAMOL 500 MG PO TABS
1000.0000 mg | ORAL_TABLET | Freq: Three times a day (TID) | ORAL | Status: DC
  Administered 2020-11-08 – 2020-11-11 (×10): 1000 mg via ORAL
  Filled 2020-11-08 (×10): qty 2

## 2020-11-08 MED ORDER — POLYETHYLENE GLYCOL 3350 17 G PO PACK
17.0000 g | PACK | Freq: Every day | ORAL | Status: DC
  Administered 2020-11-08: 17 g via ORAL
  Filled 2020-11-08 (×2): qty 1

## 2020-11-08 MED ORDER — POTASSIUM CHLORIDE 10 MEQ/100ML IV SOLN
10.0000 meq | INTRAVENOUS | Status: AC
  Administered 2020-11-08 (×2): 10 meq via INTRAVENOUS
  Filled 2020-11-08 (×2): qty 100

## 2020-11-08 MED ORDER — AMLODIPINE BESYLATE 5 MG PO TABS
5.0000 mg | ORAL_TABLET | Freq: Every day | ORAL | Status: DC
  Administered 2020-11-09 – 2020-11-11 (×3): 5 mg via ORAL
  Filled 2020-11-08 (×3): qty 1

## 2020-11-08 NOTE — Progress Notes (Signed)
PT Cancellation Note  Patient Details Name: Karen Robertson MRN: [id number] DOB: 12/11/1960   Cancelled Treatment:    Reason Eval/Treat Not Completed: Patient at procedure or test/unavailable  Will follow up later today as time allows;  Otherwise, will follow up for PT tomorrow;   Thank you,  Roney Marion, PT  Acute Rehabilitation Services Pager [phone] Office [phone]    [place] 11/08/2020, 12:03 PM
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
## 2020-11-08 NOTE — Progress Notes (Signed)
ANTICOAGULATION CONSULT NOTE - Initial Consult  Pharmacy Consult for Apixaban Indication: RA mass  No Known Allergies  Patient Measurements: Height: 5\' 7"  (170.2 cm) Weight: 67.9 kg (149 lb 11.1 oz) IBW/kg (Calculated) : 61.6    Vital Signs: Temp: 98.3 F (36.8 C) (10/04 0900) Temp Source: Oral (10/04 0900) BP: 153/90 (10/04 0900) Pulse Rate: 77 (10/04 0900)  Labs: Recent Labs    11/07/20 1232 11/08/20 0059 11/08/20 0823  HGB  --  11.6*  --   HCT  --  33.6*  --   PLT  --  151  --   CREATININE 0.66  --  0.65    Estimated Creatinine Clearance: 75.4 mL/min (by C-G formula based on SCr of 0.65 mg/dL).   Medical History: Past Medical History:  Diagnosis Date   Health care maintenance 05/25/2020   Hypertension    Sleep apnea    maybe???   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 No anticoagulation prior to admission. Pharmacy consulted for heparin.    Good renal function. Last dose enoxaparin given 10/4 9:30am. H/H, plt stable.  Goal of Therapy:  Monitor platelets by anticoagulation protocol: Yes   Plan:  Apixaban 10mg  BID x 7d then decrease to 5 mg BID Monitor for signs/symptoms of bleeding  F/u further workup   Benetta Spar, PharmD, BCPS, BCCP Clinical Pharmacist  Please check AMION for all [facility] phone numbers After 10:00 PM, call [place] [phone]
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
## 2020-11-08 NOTE — Progress Notes (Signed)
Cage-Aid has been completed.  TOC CSW provided pt with resources for possible future use.  George Alcantar Tarpley-Carter, MSW, LCSW-A Pronouns:  She/Her/Hers [facility] HealthTransitions of Care Clinical Social Worker Direct Number:  [phone] [email or web address]

## 2020-11-08 NOTE — TOC CAGE-AID Note (Signed)
Transition of Care [facility]) - CAGE-AID Screening   Patient Details  Name: Karen Robertson MRN: [id number] Date of Birth: 13-Mar-1960   Elvina Sidle, RN Trauma Response Nurse Phone Number: [phone] 11/08/2020, 1:53 PM   CAGE-AID Screening:    Have You Ever Felt You Ought to Cut Down on Your Drinking or Drug Use?: No Have People Annoyed You By [employer] Your Drinking Or Drug Use?: No Have You Felt Bad Or Guilty About Your Drinking Or Drug Use?: No Have You Ever Had a Drink or Used Drugs First Thing In The Morning to Steady Your Nerves or to Get Rid of a Hangover?: No CAGE-AID Score: 0  Substance Abuse Education Offered: No (Pt states that she only drinks occasionally, does not use street drugs)
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
## 2020-11-09 ENCOUNTER — Other Ambulatory Visit (HOSPITAL_COMMUNITY): Payer: Self-pay

## 2020-11-09 DIAGNOSIS — I513 Intracardiac thrombosis, not elsewhere classified: Secondary | ICD-10-CM

## 2020-11-09 DIAGNOSIS — I10 Essential (primary) hypertension: Secondary | ICD-10-CM | POA: Diagnosis not present

## 2020-11-09 LAB — BASIC METABOLIC PANEL
Anion gap: 8 (ref 5–15)
BUN: 10 mg/dL (ref 6–20)
CO2: 30 mmol/L (ref 22–32)
Calcium: 8.4 mg/dL — ABNORMAL LOW (ref 8.9–10.3)
Chloride: 91 mmol/L — ABNORMAL LOW (ref 98–111)
Creatinine, Ser: 0.61 mg/dL (ref 0.44–1.00)
GFR, Estimated: >60 mL/min (ref >60)
Glucose, Bld: 113 mg/dL — ABNORMAL HIGH (ref 70–99)
Potassium: 3.7 mmol/L (ref 3.5–5.1)
Sodium: 129 mmol/L — ABNORMAL LOW (ref 135–145)

## 2020-11-09 LAB — CBC
HCT: 35.5 % — ABNORMAL LOW (ref 36.0–46.0)
Hemoglobin: 12.2 g/dL (ref 12.0–15.0)
MCH: 33.1 pg (ref 26.0–34.0)
MCHC: 34.4 g/dL (ref 30.0–36.0)
MCV: 96.2 fL (ref 80.0–100.0)
Platelets: 174 10*3/uL (ref 150–400)
RBC: 3.69 MIL/uL — ABNORMAL LOW (ref 3.87–5.11)
RDW: 13.5 % (ref 11.5–15.5)
WBC: 6.6 10*3/uL (ref 4.0–10.5)
nRBC: 0 % (ref 0.0–0.2)

## 2020-11-09 MED ORDER — SODIUM CHLORIDE 0.9 % IV BOLUS
500.0000 mL | Freq: Once | INTRAVENOUS | Status: AC
  Administered 2020-11-09: 500 mL via INTRAVENOUS

## 2020-11-09 MED ORDER — HYDROMORPHONE HCL 1 MG/ML IJ SOLN: 0.5000 mg | Freq: Three times a day (TID) | INTRAMUSCULAR | Status: DC | PRN

## 2020-11-09 MED ORDER — SODIUM CHLORIDE 1 G PO TABS
1.0000 g | ORAL_TABLET | Freq: Three times a day (TID) | ORAL | Status: DC
  Administered 2020-11-09 – 2020-11-11 (×6): 1 g via ORAL
  Filled 2020-11-09 (×8): qty 1

## 2020-11-09 NOTE — TOC Progression Note (Signed)
Transition of Care (TOC) - Progression Note    Patient Details  Name: Karen Robertson MRN: [id number] Date of Birth: 01/31/1961  Transition of Care (TOC) CM/SW Contact  Amerson, Julie M, RN Phone Number: 11/09/2020, 5:28 PM  Clinical Narrative:    Met with pt to confirm dc plans; she has had DME delivered to room.  Patient states she "will not be left alone" at discharge; states niece and sister plan to assist her.  We discussed need for Eliquis at discharge for tx of atrial clot; it appears she has a very large deductible and copay of over $500.  We discussed patient assistance program for Eliquis and application given to patient; will investigate to see if patient is able to use Eliquis copay card, though it seems unlikely.   Will follow up on 11/10/20.    Expected Discharge Plan: OP Rehab Barriers to Discharge: Continued Medical Work up  Expected Discharge Plan and Services Expected Discharge Plan: OP Rehab   Discharge Planning Services: CM Consult   Living arrangements for the past 2 months: Single Family Home                 DME Arranged: 3-N-1, Walker rolling DME Agency: [employer] Date DME Agency Contacted: 11/08/20 Time DME Agency Contacted: 1645 Representative spoke with at DME Agency: Sheila             Social Determinants of Health (SDOH) Interventions    Readmission Risk Interventions No flowsheet data found.  Julie W. Amerson, RN, BSN  Trauma/Neuro ICU Case Manager [phone]  
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
## 2020-11-09 NOTE — Progress Notes (Signed)
Progress Note  Patient Name: Karen Robertson Date of Encounter: 11/09/2020  [facility] Cardiologist: None   Subjective   Denies any chest pain or dyspnea  Inpatient Medications    Scheduled Meds:  acetaminophen  1,000 mg Oral Q6H   amLODipine  5 mg Oral Daily   apixaban  10 mg Oral BID   Followed by   Derrill Memo ON 11/15/2020] apixaban  5 mg Oral BID   atorvastatin  20 mg Oral Daily   docusate sodium  100 mg Oral BID   folic acid  1 mg Oral Daily   methocarbamol  1,000 mg Oral Q8H   multivitamin with minerals  1 tablet Oral Daily   polyethylene glycol  17 g Oral Daily   sodium chloride  1 g Oral TID WC   thiamine  100 mg Oral Daily   Or   thiamine  100 mg Intravenous Daily   Continuous Infusions:  sodium chloride     PRN Meds: fluticasone, hydrALAZINE, HYDROmorphone (DILAUDID) injection, ibuprofen, ondansetron **OR** ondansetron (ZOFRAN) IV, oxyCODONE   Vital Signs    Vitals:   11/09/20 0635 11/09/20 0738 11/09/20 0838 11/09/20 1142  BP: 122/75 99/61 119/61 122/83  Pulse: 83 76 65 75  Resp: 16 16    Temp: 98.6 F (37 C) 98.1 F (36.7 C)    TempSrc: Oral Oral    SpO2: 100% 100%  94%  Weight:      Height:        Intake/Output Summary (Last 24 hours) at 11/09/2020 1352 Last data filed at 11/09/2020 0606 Gross per 24 hour  Intake --  Output 700 ml  Net -700 ml    Last 3 Weights 11/05/2020 08/03/2020 07/25/2020  Weight (lbs) 149 lb 11.1 oz 149 lb 12.8 oz 151 lb 9.6 oz  Weight (kg) 67.9 kg 67.949 kg 68.765 kg      Telemetry    Not on telemetry- Personally Reviewed  ECG    Normal sinus rhythm, rate 75, no ST abnormalities- Personally Reviewed  Physical Exam   GEN: No acute distress.   Neck: Cervical collar  Cardiac: RRR, no murmurs, rubs, or gallops.  Respiratory: Clear to auscultation bilaterally. GI: Soft, nontender, non-distended  MS: No edema; No deformity. Neuro:  Nonfocal  Psych: Normal affect   Labs    High Sensitivity Troponin:  No  results for input(s): TROPONINIHS in the last 720 hours.   Chemistry Recent Labs  Lab 11/05/20 1345 11/05/20 1423 11/07/20 1232 11/08/20 0823 11/09/20 0135  NA 138   < > 132* 131* 129*  K 4.6   < > 2.8* 2.8* 3.7  CL 104   < > 89* 87* 91*  CO2 25  --  29 33* 30  GLUCOSE 141*   < > 136* 105* 113*  BUN 10   < > 9 8 10   CREATININE 0.66   < > 0.66 0.65 0.61  CALCIUM 8.8*  --  9.2 9.2 8.4*  MG  --   --   --  2.1  --   PROT 6.5  --   --   --   --   ALBUMIN 3.7  --   --   --   --   AST 140*  --   --   --   --   ALT 41  --   --   --   --   ALKPHOS 57  --   --   --   --  BILITOT 1.6*  --   --   --   --   GFRNONAA >60  --  >60 >60 >60  ANIONGAP 9  --  14 11 8    < > = values in this interval not displayed.     Lipids No results for input(s): CHOL, TRIG, HDL, LABVLDL, LDLCALC, CHOLHDL in the last 168 hours.  Hematology Recent Labs  Lab 11/05/20 1345 11/05/20 1423 11/08/20 0059 11/09/20 1024  WBC 7.4  --  6.1 6.6  RBC 3.76*  --  3.53* 3.69*  HGB 12.4 12.6 11.6* 12.2  HCT 36.6 37.0 33.6* 35.5*  MCV 97.3  --  95.2 96.2  MCH 33.0  --  32.9 33.1  MCHC 33.9  --  34.5 34.4  RDW 14.0  --  13.5 13.5  PLT 204  --  151 174    Thyroid No results for input(s): TSH, FREET4 in the last 168 hours.  BNPNo results for input(s): BNP, PROBNP in the last 168 hours.  DDimer No results for input(s): DDIMER in the last 168 hours.   Radiology    MR CARDIAC MORPHOLOGY W WO CONTRAST  Result Date: 11/08/2020 CLINICAL DATA:  Evaluate right atrial mass EXAM: CARDIAC MRI TECHNIQUE: The patient was scanned on a 1.5 Tesla Siemens magnet. A dedicated cardiac coil was used. Functional imaging was done using Fiesta sequences. 2,3, and 4 chamber views were done to assess for RWMA's. Modified Simpson's rule using a short axis stack was used to calculate an ejection fraction on a dedicated work Conservation officer, nature. The patient received 7 cc of Gadavist. After 10 minutes inversion recovery sequences  were used to assess for infiltration and scar tissue. CONTRAST:  7 cc  of Gadavist FINDINGS: Left ventricle: -Normal size -Normal Schickling thickness -Hyperdynamic systolic function -Normal ECV (26%) -No LGE LV EF:  72% (Normal 56-78%) Absolute volumes: LV EDV: 17mL (Normal 52-141 mL) LV ESV: 39mL (Normal 13-51 mL) LV SV: 1mL (Normal 33-97 mL) CO: 5.4L/min (Normal 2.7-6.0 L/min) Indexed volumes: LV EDV: 40mL/sq-m (Normal 41-81 mL/sq-m) LV ESV: 18mL/sq-m (Normal 12-21 mL/sq-m) LV SV: 64mL/sq-m (Normal 26-56 mL/sq-m) CI: 3.0L/min/sq-m (Normal 1.8-3.8 L/min/sq-m) Right ventricle: Normal size and systolic function RV EF: 87% (Normal 47-80%) Absolute volumes: RV EDV: 87mL (Normal 58-154 mL) RV ESV: 15mL (Normal 12-68 mL) RV SV: 34mL (Normal 35-98 mL) CO: 5.1L/min (Normal 2.7-6 L/min) Indexed volumes: RV EDV: 55mL/sq-m (Normal 48-87 mL/sq-m) RV ESV: 30mL/sq-m (Normal 11-28 mL/sq-m) RV SV: 8mL/sq-m (Normal 27-57 mL/sq-m) CI: 2.8L/min/sq-m (Normal 1.8-3.8 L/min/sq-m) Left atrium: Normal size Right atrium: Normal size. Mass at junction of SVC and right atrium measuring 3mm x 56mm. Not well seen on cine images but seen on LGE imaging with no contrast uptake. Consistent with thrombus. Mitral valve: Trivial regurgitation Aortic valve: Tricuspid. No regurgitation Tricuspid valve: Trivial regurgitation Pulmonic valve: No regurgitation Aorta: Normal proximal ascending aorta Pericardium: Normal IMPRESSION: 1. Mass at junction of SVC and right atrium measuring 65mm x 73mm. Not well seen on cine images but seen on LGE imaging with no contrast uptake. Consistent with thrombus. 2. Normal LV size, normal Tiner thickness, and hyperdynamic systolic function (EF 86%) 3.  Normal RV size and systolic function (EF 76%) 4.  No LGE to suggest myocardial scar Electronically Signed   By: Oswaldo Milian M.D.   On: 11/08/2020 23:40    Cardiac Studies     Patient Profile     60 y.o. female with a hx of HTN, alcohol abuse and  hypertriglyceridemia who  is being seen 11/07/2020 for the evaluation of atrial mass  Assessment & Plan    Atrial mass - Incidental findings. No cardiac issue previously. Denies cardiac symptoms.  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  Discussed with surgery team, okay for anticoagulation.  Started on Eliquis.  Will f/u as outpatient and plan repeat MRI after anticoagulation x3 months to ensure resolution   2. HTN - BP improving -On HCTZ 25 mg daily.  She is having issues with hypokalemia and hyponatremia, likely due to HCTZ.  Recommend discontinuing HCTZ and starting amlodipine 5 mg daily for hypertension.   3. HLD - Continue statin   [facility] will sign off.   Medication Recommendations: Eliquis 10 mg twice daily x7 days, then reduce to 5 mg twice daily moving forward.  Discontinue hydrochlorothiazide.  Started amlodipine 5 mg daily Other recommendations (labs, testing, etc): None Follow up as an outpatient: Scheduled for 11/14   For questions or updates, please contact [place] Please consult [email or web address] for contact info under        Signed, Donato Heinz, MD  11/09/2020, 1:52 PM
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
## 2020-11-09 NOTE — TOC Benefit Eligibility Note (Signed)
Patient Teacher, English as a foreign language completed.    The patient is currently admitted and upon discharge could be taking Eliquis 5 mg.  The current 30 day co-pay is, $518.27 due to a $7,556.75 deductible remaining.   The patient is insured through [employer] [place] Patient Advocate Specialist [place] Team Direct Number: [phone]  Fax: [phone]

## 2020-11-10 ENCOUNTER — Other Ambulatory Visit (HOSPITAL_COMMUNITY): Payer: Self-pay

## 2020-11-10 ENCOUNTER — Telehealth: Payer: Self-pay | Admitting: Licensed Clinical Social Worker

## 2020-11-10 ENCOUNTER — Encounter (HOSPITAL_COMMUNITY): Admission: EM | Disposition: A | Discharge status: Home / Self Care | Payer: Self-pay | Source: Home / Self Care

## 2020-11-10 LAB — BASIC METABOLIC PANEL
Anion gap: 10 (ref 5–15)
BUN: 10 mg/dL (ref 6–20)
CO2: 28 mmol/L (ref 22–32)
Calcium: 8.9 mg/dL (ref 8.9–10.3)
Chloride: 95 mmol/L — ABNORMAL LOW (ref 98–111)
Creatinine, Ser: 0.58 mg/dL (ref 0.44–1.00)
GFR, Estimated: >60 mL/min (ref >60)
Glucose, Bld: 99 mg/dL (ref 70–99)
Potassium: 3.6 mmol/L (ref 3.5–5.1)
Sodium: 133 mmol/L — ABNORMAL LOW (ref 135–145)

## 2020-11-10 SURGERY — ECHOCARDIOGRAM, TRANSESOPHAGEAL
Anesthesia: Monitor Anesthesia Care

## 2020-11-10 MED ORDER — APIXABAN (ELIQUIS) VTE STARTER PACK (10MG AND 5MG)
ORAL_TABLET | ORAL | 0 refills | Status: DC
  Filled 2020-11-10: qty 74, 30d supply, fill #0

## 2020-11-10 MED ORDER — APIXABAN (ELIQUIS) VTE STARTER PACK (10MG AND 5MG)
ORAL_TABLET | ORAL | 0 refills | Status: DC
  Filled 2020-11-10: qty 1, fill #0

## 2020-11-10 MED ORDER — SIMETHICONE 80 MG PO CHEW
80.0000 mg | CHEWABLE_TABLET | Freq: Four times a day (QID) | ORAL | Status: DC | PRN
  Administered 2020-11-10 – 2020-11-11 (×2): 80 mg via ORAL
  Filled 2020-11-10 (×3): qty 1

## 2020-11-10 NOTE — TOC Progression Note (Signed)
Transition of Care [facility]) - Progression Note    Patient Details  Name: Karen Robertson MRN: [id number] Date of Birth: Apr 11, 1960  Transition of [facility]) CM/SW Contact  Oren Section Cleta Alberts, RN Phone Number: 11/10/2020, 12:58 PM  Clinical Narrative:    Per social worker at cardiologist office, patient will be able to use co-pay card for Eliquis and receive for $10 a month.  30-day trial card and co-pay cards given to patient.  Met with patient and daughter, in room; reviewed Eliquis co-pay card information.  Patient for possible discharge later today pending progress with therapies.  Will refer patient to outpatient PT OT, as patient has no home health benefits with current insurance.  Patient states that she does have assistance at home, and transportation to outpatient appointments.   Expected Discharge Plan: OP Rehab Barriers to Discharge: Barriers Resolved  Expected Discharge Plan and Services Expected Discharge Plan: OP Rehab   Discharge Planning Services: Medication Assistance, CM Consult   Living arrangements for the past 2 months: Single Family Home                 DME Arranged: 3-N-1, Walker rolling DME Agency: [employer] Date DME Agency Contacted: 11/08/20 Time DME Agency Contacted: [phone] Representative spoke with at DME Agency: [place] Interventions    Readmission Risk Interventions No flowsheet data found.  Reinaldo Raddle, RN, BSN  Trauma/Neuro ICU Case Manager [phone]
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
## 2020-11-10 NOTE — Telephone Encounter (Signed)
Aware there unfortunately may be some challenge to pt using the copay card for discount per Marcie Bal, RN, Eliquis rep. I have requested if possible for the inpatient team provide pt with the copay card prior to discharge and we will call her prior to the end of the month to assess eligibility. Aware Marcie Bal possibly able to assist with samples as needed as these can be limited by clinic availability. I have made a note to f/u w/ pt prior to the end of the month to assess needs at that time. Appreciate TOC coordination.   Westley Hummer, MSW, [place]  [phone]
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
## 2020-11-10 NOTE — TOC Progression Note (Signed)
Transition of Care [facility]) - Progression Note    Patient Details  Name: Karen Robertson MRN: [id number] Date of Birth: January 21, 1961  Transition of Care [facility]) CM/SW Contact  Oren Section Cleta Alberts, RN Phone Number: 11/10/2020, 10:57 AM  Clinical Narrative:    Spoke with [name], RN, BSN, Senior Librarian, academic for Eliquis: she states that patient will not be eligible to use copay card.  She will follow up with Dr. Newman Nickels office and provide samples for patient, after the first Rx.  Patient will receive first Rx free; recommend sending dc Rx to [place].     Expected Discharge Plan: OP Rehab Barriers to Discharge: Continued Medical Work up  Expected Discharge Plan and Services Expected Discharge Plan: OP Rehab   Discharge Planning Services: CM Consult   Living arrangements for the past 2 months: Single Family Home                 DME Arranged: 3-N-1, Walker rolling DME Agency: [employer] Date DME Agency Contacted: 11/08/20 Time DME Agency Contacted: [phone] Representative spoke with at DME Agency: [place] ([place]) Interventions    Readmission Risk Interventions No flowsheet data found.  Reinaldo Raddle, RN, BSN  Trauma/Neuro ICU Case Manager [phone]
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
## 2020-11-10 NOTE — Telephone Encounter (Signed)
Inquiry sent to our team here at [facility], where pt will have f/u visit after discharge, regarding cost of Eliquis. Pt will be connected w/ a 30 day free supply but is concerned about filling after that time. Pt not coming to office until after 30 days. I confirmed with pharmacy staff that pt should be eligible for copay card after 30 day free card utilized. I connected with Crystal R, RPH, and she was able to obtain a card to provide to pt and pt daughter. If pt should have any additional challenges I encouraged team to let her or her family know that she can give our office a call before the appt and we can trouble shoot with her. Trauma PA Brooke and [facility] team made aware. I remain available for any additional questions.   Westley Hummer, MSW, [place]  [phone]

## 2020-11-10 NOTE — Progress Notes (Signed)
Physical Therapy Treatment Note  Clinical Impression: Noting persistent BP drop with upright activity as noted here:    11/10/20 1300 11/10/20 1305 11/10/20 1308  Orthostatic Sitting  BP- Sitting 172/89 (map 112)  --  105/70 (Map 84)  Pulse- Sitting 80  --  68  Orthostatic Standing at 0 minutes  BP- Standing at 0 minutes 104/74 (!) 87/77 (MAP 83)  --   Pulse- Standing at 0 minutes 82 (dizzy) 86 (dizzy)  --     11/10/20 1310  Orthostatic Sitting  BP- Sitting 133/76 (after basic pivot BSC to recliner)  Ended session in recliner  Pulse- Sitting 65  Orthostatic Standing at 0 minutes  BP- Standing at 0 minutes  --   Pulse- Standing at 0 minutes  --     Claiborne Billings, Trauma PA and Andris Baumann, RN notified;   We have been consistently premedicating her for pain before sessions, and I'm wondering if the tendency for pain meds to decr BP + upright activity is causing such significant postural hypotension; To find out, we can consider seeing her before pain meds next session (but must be fully aware that her pain level is quite high, especially with mobility);   (Full PT treatment note to follow)  Roney Marion, [place] Pager [phone] Office [phone]

## 2020-11-11 ENCOUNTER — Other Ambulatory Visit (HOSPITAL_COMMUNITY): Payer: Self-pay

## 2020-11-11 ENCOUNTER — Encounter (HOSPITAL_COMMUNITY): Payer: Self-pay

## 2020-11-11 MED ORDER — OXYCODONE HCL 5 MG PO TABS
5.0000 mg | ORAL_TABLET | Freq: Four times a day (QID) | ORAL | 0 refills | Status: DC | PRN
  Filled 2020-11-11: qty 30, 4d supply, fill #0

## 2020-11-11 MED ORDER — APIXABAN 5 MG PO TABS
5.0000 mg | ORAL_TABLET | Freq: Two times a day (BID) | ORAL | 1 refills | Status: DC
  Filled 2020-11-11: qty 60, 30d supply, fill #0

## 2020-11-11 MED ORDER — AMLODIPINE BESYLATE 5 MG PO TABS
5.0000 mg | ORAL_TABLET | Freq: Every day | ORAL | 0 refills | Status: DC
  Filled 2020-11-11: qty 30, 30d supply, fill #0

## 2020-11-11 MED ORDER — DOCUSATE SODIUM 100 MG PO CAPS: 100.0000 mg | ORAL_CAPSULE | Freq: Two times a day (BID) | ORAL | 0 refills | Status: AC

## 2020-11-11 MED ORDER — POLYETHYLENE GLYCOL 3350 17 G PO PACK: 17.0000 g | PACK | Freq: Every day | ORAL | 0 refills | Status: DC | PRN

## 2020-11-11 MED ORDER — METHOCARBAMOL 500 MG PO TABS
1000.0000 mg | ORAL_TABLET | Freq: Three times a day (TID) | ORAL | 0 refills | Status: AC | PRN
  Filled 2020-11-11: qty 30, 5d supply, fill #0

## 2020-11-11 NOTE — Progress Notes (Signed)
ANTICOAGULATION CONSULT NOTE - Follow Up Consult  Pharmacy Consult for Eliquis Indication:  atrial thrombus  No Known Allergies  Patient Measurements: Height: 5\' 7"  (170.2 cm) Weight: 67.9 kg (149 lb 11.1 oz) IBW/kg (Calculated) : 61.6  Vital Signs: Temp: 98.8 F (37.1 C) (10/07 0740) Temp Source: Oral (10/07 0740) BP: 130/70 (10/07 0740) Pulse Rate: 80 (10/07 0740)  Labs: Recent Labs    11/09/20 0135 11/09/20 1024 11/10/20 0147  HGB  --  12.2  --   HCT  --  35.5*  --   PLT  --  174  --   CREATININE 0.61  --  0.58    Estimated Creatinine Clearance: 75.4 mL/min (by C-G formula based on SCr of 0.58 mg/dL).   Assessment: Anticoag:  Atrial mass.Cardiac MRI consistent with right atrial thrombus. Hgb 12.2 up. Plts 174, Scr <1  Goal of Therapy:  Therapeutic oral anticoagulation  Plan:  Con't Eliquis 10mg  BID x7d then 5mg  BID   Chikita Dogan S. Alford [place], PharmD, BCPS Clinical Staff Pharmacist [email or web address]  Alford Highland, [employer] 11/11/2020,9:54 AM

## 2020-11-11 NOTE — TOC Transition Note (Signed)
Transition of Care [facility]) - CM/SW Discharge Note   Patient Details  Name: Karen Robertson MRN: [id number] Date of Birth: [birth year]  [facility] CM/SW Contact:  Ella Bodo, RN Phone Number: 11/11/2020, 4:31 PM   Clinical Narrative:    Patient medically stable for discharge home today with family/friends to assist with care.  Patient has 3 in 1 and rolling walker in room; OT now recommending tub bench.  Referral to [place] for tub bench, to be delivered to bedside prior to discharge.  Spoke with patient's daughter, and they would like to pay the $60 charge for DME.   Final next level of care: OP Rehab Barriers to Discharge: Barriers Resolved   Patient Goals and CMS Choice Patient states their goals for this hospitalization and ongoing recovery are:: to feel better                            Discharge Plan and Services   Discharge Planning Services: Medication Assistance, CM Consult            DME Arranged: 3-N-1, Tub bench, Walker DME Agency: [employer] Date DME Agency Contacted: 11/08/20 Time DME Agency Contacted: [phone] Representative spoke with at DME Agency: [place] ([place]) Interventions     Readmission Risk Interventions No flowsheet data found.  Reinaldo Raddle, RN, BSN  Trauma/Neuro ICU Case Manager [phone]
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
## 2020-11-11 NOTE — Progress Notes (Signed)
Coletta Memos Leisner to be D/C'd  per MD order.  Discussed with the patient and all questions fully answered.  VSS, Skin clean, dry and intact without evidence of skin break down, no evidence of skin tears noted.  IV catheter discontinued intact. Site without signs and symptoms of complications. Dressing and pressure applied.  An After Visit Summary was printed and given to the patient. Patient received prescription from [facility].  D/c education completed with patient/family including follow up instructions, medication list, d/c activities limitations if indicated, with other d/c instructions as indicated by MD - patient able to verbalize understanding, all questions fully answered.   Patient instructed to return to ED, call 911, or call MD for any changes in condition.   Patient to be escorted via [place], and D/C home via private auto.  DME--all supplies except bath chair already sent home with family this week. [employer] will send bath chair to pt preferred address post-d/c free of charge.

## 2020-11-18 ENCOUNTER — Telehealth: Payer: Self-pay | Admitting: *Deleted
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
## 2020-11-21 ENCOUNTER — Ambulatory Visit: Payer: PRIVATE HEALTH INSURANCE | Admitting: Occupational Therapy

## 2020-11-21 ENCOUNTER — Ambulatory Visit: Payer: PRIVATE HEALTH INSURANCE | Attending: General Surgery

## 2020-11-21 ENCOUNTER — Other Ambulatory Visit: Payer: Self-pay

## 2020-11-21 ENCOUNTER — Other Ambulatory Visit: Payer: Self-pay | Admitting: Family Medicine

## 2020-11-21 DIAGNOSIS — R2689 Other abnormalities of gait and mobility: Secondary | ICD-10-CM | POA: Diagnosis present

## 2020-11-21 DIAGNOSIS — R2681 Unsteadiness on feet: Secondary | ICD-10-CM | POA: Diagnosis present

## 2020-11-21 DIAGNOSIS — M6281 Muscle weakness (generalized): Secondary | ICD-10-CM

## 2020-11-21 DIAGNOSIS — S2231XD Fracture of one rib, right side, subsequent encounter for fracture with routine healing: Secondary | ICD-10-CM | POA: Insufficient documentation

## 2020-11-21 DIAGNOSIS — M25511 Pain in right shoulder: Secondary | ICD-10-CM

## 2020-11-21 DIAGNOSIS — S129XXD Fracture of neck, unspecified, subsequent encounter: Secondary | ICD-10-CM | POA: Insufficient documentation
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
## 2020-11-24 ENCOUNTER — Telehealth (HOSPITAL_COMMUNITY): Payer: Self-pay

## 2020-11-24 ENCOUNTER — Encounter: Payer: Self-pay | Admitting: Family Medicine

## 2020-11-24 ENCOUNTER — Other Ambulatory Visit: Payer: Self-pay

## 2020-11-24 ENCOUNTER — Other Ambulatory Visit (HOSPITAL_COMMUNITY): Payer: Self-pay

## 2020-11-24 ENCOUNTER — Ambulatory Visit (INDEPENDENT_AMBULATORY_CARE_PROVIDER_SITE_OTHER): Payer: No Typology Code available for payment source | Admitting: Family Medicine

## 2020-11-24 DIAGNOSIS — M7989 Other specified soft tissue disorders: Secondary | ICD-10-CM

## 2020-11-24 DIAGNOSIS — S32018D Other fracture of first lumbar vertebra, subsequent encounter for fracture with routine healing: Secondary | ICD-10-CM

## 2020-11-24 MED ORDER — SENNA 8.6 MG PO TABS: 1.0000 | ORAL_TABLET | Freq: Every day | ORAL | 0 refills | Status: AC

## 2020-11-24 MED ORDER — OXYCODONE HCL 5 MG PO TABS: 5.0000 mg | ORAL_TABLET | Freq: Four times a day (QID) | ORAL | 0 refills | Status: AC | PRN

## 2020-11-24 MED ORDER — POLYETHYLENE GLYCOL 3350 17 GM/SCOOP PO POWD: 17.0000 g | Freq: Every day | ORAL | 0 refills | Status: AC

## 2020-11-24 NOTE — Patient Instructions (Signed)
Today we refilled your medications.   Please be on the lookout for a call from the Neurosurgery to schedule a follow up appointment in the next few weeks.   For the area on your right side, please let us know if you notice increased pain or enlarging size quickly (as if overnight).   Please plan to follow up with our office in 4 weeks.   I will be on the lookout for the FMLA paperwork and our office will notify you when it is ready for pick up.   Dr. Quentin Cornwall
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
## 2020-11-28 ENCOUNTER — Telehealth: Payer: Self-pay | Admitting: Licensed Clinical Social Worker

## 2020-11-28 ENCOUNTER — Other Ambulatory Visit (HOSPITAL_COMMUNITY): Payer: Self-pay
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
## 2020-11-28 NOTE — Telephone Encounter (Signed)
Patient returns call to nurse line regarding FMLA paperwork.   Daughter reports these need to be turned in by Thursday, 10/27.  Advised of policy on paperwork.   Please advise.   Talbot Grumbling, RN

## 2020-11-28 NOTE — Telephone Encounter (Signed)
LCSW received call back from pt daughter [name] [phone]). Pt daughter inquiring about this writer's call w/ pt this morning. Reviewed that I encouraged her to f/u with PCP if still havent heard back about FMLA as sometimes messages back up. I also encouraged pt to f/u with pharmacy w/ plenty of time before her medications run out to ensure she can utilize coupon- pt daughter shares she has activated it online and will assist pt with doing so. I let her know my card has been sent to pt and that I remain available to trouble shoot any issues that may arise w/ refill so long as we know with plenty of time so pt will not miss any doses. Pt daughter inquires about ride assistance w/ upcoming appts. I shared that we do have assistance through [facility] (pt does not have any ride benefits through her current commercial insurance). I will refer pt and pt daughter can assist w/ completing enrollment. Pt daughter agreeable, provided w/ [employer] number.   Westley Hummer, MSW, [place]  [phone]

## 2020-11-29 ENCOUNTER — Telehealth: Payer: Self-pay | Admitting: Family Medicine

## 2020-11-29 DIAGNOSIS — S32009A Unspecified fracture of unspecified lumbar vertebra, initial encounter for closed fracture: Secondary | ICD-10-CM | POA: Insufficient documentation
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
## 2020-11-29 NOTE — Telephone Encounter (Signed)
Called patient's daughter. Advised that paperwork was ready for pick up. Daughter requesting that paperwork be faxed to [facility] at [phone]. Advised that release of medical information would need to be signed prior to forms being faxed.   Daughter will have mother come and sign for paperwork to be faxed to this number.   Talbot Grumbling, RN

## 2020-11-29 NOTE — Telephone Encounter (Signed)
   Karen Robertson DOB: Jan 13, 1961 MRN: [id number]   RIDER WAIVER AND RELEASE OF LIABILITY  For purposes of improving physical access to our facilities, Karen Robertson is pleased to partner with third parties to provide Karen Robertson patients or other authorized individuals the option of convenient, on-demand ground transportation services (the Karen Robertson") through use of the technology service that enables users to request on-demand ground transportation from independent third-party providers.  By opting to use and accept these Karen Robertson, I, the undersigned, hereby agree on behalf of myself, and on behalf of any minor child using the Government social research officer for whom I am the parent or legal guardian, as follows:  Government social research officer provided to me are provided by independent third-party transportation providers who are not Karen Robertson or employees and who are unaffiliated with Karen Robertson. Karen Robertson is neither a transportation carrier nor a common or public carrier. Karen Robertson has no control over the quality or safety of the transportation that occurs as a result of the Karen Robertson. Karen Robertson cannot guarantee that any third-party transportation provider will complete any arranged transportation service. Karen Robertson makes no representation, warranty, or guarantee regarding the reliability, timeliness, quality, safety, suitability, or availability of any of the Transport Services or that they will be error free. I fully understand that traveling by vehicle involves risks and dangers of serious bodily injury, including permanent disability, paralysis, and death. I agree, on behalf of myself and on behalf of any minor child using the Transport Services for whom I am the parent or legal guardian, that the entire risk arising out of my use of the Karen Robertson remains solely with me, to the maximum extent permitted under applicable law. The Karen Robertson are provided "as is"  and "as available." Karen Robertson disclaims all representations and warranties, express, implied or statutory, not expressly set out in these terms, including the implied warranties of merchantability and fitness for a particular purpose. I hereby waive and release Karen Robertson, its agents, employees, officers, directors, representatives, insurers, attorneys, assigns, successors, subsidiaries, and affiliates from any and all past, present, or future claims, demands, liabilities, actions, causes of action, or suits of any kind directly or indirectly arising from acceptance and use of the Karen Robertson. I further waive and release Karen Robertson and its affiliates from all present and future liability and responsibility for any injury or death to persons or damages to property caused by or related to the use of the Karen Robertson. I have read this Waiver and Release of Liability, and I understand the terms used in it and their legal significance. This Waiver is freely and voluntarily given with the understanding that my right (as well as the right of any minor child for whom I am the parent or legal guardian using the Karen Robertson) to legal recourse against Karen Robertson in connection with the Karen Robertson is knowingly surrendered in return for use of these services.   I attest that I read the consent document to Karen Robertson, gave Karen Robertson the opportunity to ask questions and answered the questions asked (if any). I affirm that Karen Robertson then provided consent for she's participation in this program.     Karen Robertson, Patient daughter gave consent on behalf of Ms. Karen Robertson

## 2020-11-29 NOTE — Telephone Encounter (Signed)
Patient FMLA forms completed and ready for pick up by patient's family. Please call to notify patient's family via telephone.

## 2020-11-29 NOTE — Telephone Encounter (Signed)
Completed form placed in front office side pocket.

## 2020-11-30 ENCOUNTER — Other Ambulatory Visit: Payer: Self-pay

## 2020-11-30 ENCOUNTER — Ambulatory Visit: Payer: PRIVATE HEALTH INSURANCE

## 2020-11-30 DIAGNOSIS — R2681 Unsteadiness on feet: Secondary | ICD-10-CM

## 2020-11-30 DIAGNOSIS — R2689 Other abnormalities of gait and mobility: Secondary | ICD-10-CM

## 2020-11-30 DIAGNOSIS — M6281 Muscle weakness (generalized): Secondary | ICD-10-CM
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
## 2020-12-01 ENCOUNTER — Encounter: Payer: Self-pay | Admitting: Family Medicine

## 2020-12-06 ENCOUNTER — Telehealth: Payer: Self-pay | Admitting: Licensed Clinical Social Worker

## 2020-12-06 NOTE — Telephone Encounter (Signed)
LCSW received a call from pt daughter [name] [phone]). She shares that her mother wanted her to call and see if there was any assistance available for pt as she remains out of work. She has applied for Short Term Disability and pt daughter has been assisting her with paying for bills online. She also is inquiring if there are any additional resources for in home assistance (she is aware her insurance will not pay for anything). LCSW shared that there are limited resources in the community for in home care if someone does not have Medicaid coverage. I offered to share with her the list of in home care providers in the area while cautioning that unfortunately I could not recommend one agency over another. Encouraged her to speak with her mom and family currently providing care and if they financially can afford a few hours then it may be worth it to reach out to a few agencies to see if they can staff care. I also provided DSS in home care number, cautioning that there is likely a long wait list for those services due to high demand.   I shared that there are funds through [place] that can assist w/ rent/mortgage/utilities but that they are limited. Provided numbers and information about both. I also shared briefly about [employer] and [place] programs and sent links to both for consideration. I requested that she and her mother touch base and if agreeable to send [place] her mortgage statement and w9 for Patient Care Fund consideration. LCSW emailed all the above to [email or web address].   Westley Hummer, MSW, [place]  [phone]

## 2020-12-07 ENCOUNTER — Ambulatory Visit: Payer: PRIVATE HEALTH INSURANCE | Attending: General Surgery

## 2020-12-07 ENCOUNTER — Other Ambulatory Visit: Payer: Self-pay

## 2020-12-07 VITALS — BP 98/72

## 2020-12-07 DIAGNOSIS — R2689 Other abnormalities of gait and mobility: Secondary | ICD-10-CM

## 2020-12-07 DIAGNOSIS — M6281 Muscle weakness (generalized): Secondary | ICD-10-CM | POA: Diagnosis present

## 2020-12-07 DIAGNOSIS — R2681 Unsteadiness on feet: Secondary | ICD-10-CM

## 2020-12-07 MED ORDER — APIXABAN 5 MG PO TABS: 5.0000 mg | ORAL_TABLET | Freq: Two times a day (BID) | ORAL | 0 refills | Status: DC

## 2020-12-07 NOTE — Patient Instructions (Signed)
Access Code: [email or web address] URL: [email or web address] Date: 12/07/2020 Prepared by: Cherly Anderson  Exercises Walking March - 2 x daily - 7 x weekly - 1 sets - 6-8 reps Backward Walking with Counter Support - 2 x daily - 7 x weekly - 1 sets - 6-8 reps Side Stepping with Counter Support - 2 x daily - 7 x weekly - 1 sets - 6-8 reps Alternating Step Taps with Counter Support - 2 x daily - 7 x weekly - 1 sets - 10 reps
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
## 2020-12-09 ENCOUNTER — Telehealth: Payer: Self-pay | Admitting: Licensed Clinical Social Worker

## 2020-12-09 NOTE — Telephone Encounter (Signed)
LCSW f/u with pt daughter [name], was able to confirm that pt was able to get her Eliquis w/ copay card for $10. Encouraged her to let us know if any additional questions/concerns.   Westley Hummer, MSW, [place]  [phone]

## 2020-12-13 ENCOUNTER — Other Ambulatory Visit: Payer: Self-pay | Admitting: Physician Assistant

## 2020-12-14 ENCOUNTER — Other Ambulatory Visit: Payer: Self-pay

## 2020-12-14 ENCOUNTER — Ambulatory Visit: Payer: PRIVATE HEALTH INSURANCE

## 2020-12-14 VITALS — BP 98/64

## 2020-12-14 DIAGNOSIS — R2689 Other abnormalities of gait and mobility: Secondary | ICD-10-CM | POA: Diagnosis not present

## 2020-12-14 DIAGNOSIS — R2681 Unsteadiness on feet: Secondary | ICD-10-CM

## 2020-12-14 DIAGNOSIS — M6281 Muscle weakness (generalized): Secondary | ICD-10-CM
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
## 2020-12-15 ENCOUNTER — Other Ambulatory Visit: Payer: Self-pay
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
## 2020-12-19 ENCOUNTER — Encounter: Payer: Self-pay | Admitting: Cardiology

## 2020-12-19 ENCOUNTER — Ambulatory Visit (INDEPENDENT_AMBULATORY_CARE_PROVIDER_SITE_OTHER): Payer: No Typology Code available for payment source | Admitting: Cardiology

## 2020-12-19 ENCOUNTER — Other Ambulatory Visit: Payer: Self-pay

## 2020-12-19 VITALS — BP 106/74 | HR 94 | Ht 67.0 in | Wt 143.0 lb

## 2020-12-19 DIAGNOSIS — R609 Edema, unspecified: Secondary | ICD-10-CM | POA: Diagnosis not present

## 2020-12-19 DIAGNOSIS — E785 Hyperlipidemia, unspecified: Secondary | ICD-10-CM

## 2020-12-19 DIAGNOSIS — I513 Intracardiac thrombosis, not elsewhere classified: Secondary | ICD-10-CM

## 2020-12-19 DIAGNOSIS — I10 Essential (primary) hypertension: Secondary | ICD-10-CM

## 2020-12-19 NOTE — Patient Instructions (Addendum)
Medication Instructions:  Your physician recommends that you continue on your current medications as directed. Please refer to the Current Medication list given to you today.  *If you need a refill on your cardiac medications before your next appointment, please call your pharmacy*  Lab Work: Please return for fasting labs 1-2 weeks before MRI (BMET, CBC, Lipid)  Our in office lab hours are Monday-Friday 8:00-4:00, closed for lunch 12:45-1:45 pm.  No appointment needed.  Testing/Procedures: LE venous duplex (r/o DVT)  Your physician has requested that you have a cardiac MRI in 2 MONTHS (January). Cardiac MRI uses a computer to create images of your heart as its beating, producing both still and moving pictures of your heart and major blood vessels. For further information please visit [email or web address] Please follow the instruction sheet given to you today for more information.  Follow-Up: At [facility], you and your health needs are our priority.  As part of our continuing mission to provide you with exceptional heart care, we have created designated Provider Care Teams.  These Care Teams include your primary Cardiologist (physician) and Advanced Practice Providers (APPs -  Physician Assistants and Nurse Practitioners) who all work together to provide you with the care you need, when you need it.  We recommend signing up for the patient portal called "MyChart".  Sign up information is provided on this After Visit Summary.  MyChart is used to connect with patients for Virtual Visits (Telemedicine).  Patients are able to view lab/test results, encounter notes, upcoming appointments, etc.  Non-urgent messages can be sent to your provider as well.   To learn more about what you can do with MyChart, go to [email or web address].    Your next appointment:   3 month(s)  The format for your next appointment:   In Person  Provider:   Dr. Gardiner Rhyme

## 2020-12-21 ENCOUNTER — Ambulatory Visit: Payer: PRIVATE HEALTH INSURANCE

## 2020-12-21 ENCOUNTER — Other Ambulatory Visit: Payer: Self-pay

## 2020-12-21 DIAGNOSIS — R2689 Other abnormalities of gait and mobility: Secondary | ICD-10-CM

## 2020-12-21 DIAGNOSIS — R2681 Unsteadiness on feet: Secondary | ICD-10-CM

## 2020-12-21 DIAGNOSIS — M6281 Muscle weakness (generalized): Secondary | ICD-10-CM
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
## 2020-12-21 NOTE — Patient Instructions (Signed)
Access Code: [email or web address] URL: [email or web address] Date: 12/21/2020 Prepared by: Cherly Anderson  Exercises Walking March - 2 x daily - 7 x weekly - 1 sets - 6-8 reps Backward Walking with Counter Support - 2 x daily - 7 x weekly - 1 sets - 6-8 reps Side Stepping with Counter Support - 2 x daily - 7 x weekly - 1 sets - 6-8 reps Alternating Step Taps with Counter Support - 2 x daily - 7 x weekly - 1 sets - 10 reps Heel Toe Raises with Counter Support - 1 x daily - 7 x weekly - 3 sets - 10 reps

## 2020-12-26 ENCOUNTER — Ambulatory Visit (HOSPITAL_COMMUNITY)
Admission: RE | Admit: 2020-12-26 | Discharge: 2020-12-26 | Disposition: A | Discharge status: Home / Self Care | Payer: No Typology Code available for payment source | Source: Ambulatory Visit | Attending: Cardiology | Admitting: Cardiology

## 2020-12-26 ENCOUNTER — Other Ambulatory Visit: Payer: Self-pay

## 2020-12-26 DIAGNOSIS — R600 Localized edema: Secondary | ICD-10-CM | POA: Diagnosis not present

## 2020-12-26 DIAGNOSIS — R609 Edema, unspecified: Secondary | ICD-10-CM | POA: Insufficient documentation

## 2020-12-27 ENCOUNTER — Ambulatory Visit (HOSPITAL_COMMUNITY): Payer: No Typology Code available for payment source

## 2020-12-28 ENCOUNTER — Other Ambulatory Visit: Payer: Self-pay

## 2020-12-28 ENCOUNTER — Ambulatory Visit: Payer: PRIVATE HEALTH INSURANCE

## 2020-12-28 VITALS — BP 112/78

## 2020-12-28 DIAGNOSIS — R2689 Other abnormalities of gait and mobility: Secondary | ICD-10-CM

## 2020-12-28 DIAGNOSIS — M6281 Muscle weakness (generalized): Secondary | ICD-10-CM

## 2020-12-28 DIAGNOSIS — R2681 Unsteadiness on feet: Secondary | ICD-10-CM
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
## 2021-01-03 ENCOUNTER — Encounter: Payer: Self-pay | Admitting: Family Medicine

## 2021-01-03 ENCOUNTER — Other Ambulatory Visit (HOSPITAL_COMMUNITY): Payer: Self-pay

## 2021-01-04 ENCOUNTER — Other Ambulatory Visit: Payer: Self-pay

## 2021-01-04 ENCOUNTER — Ambulatory Visit: Payer: PRIVATE HEALTH INSURANCE

## 2021-01-04 DIAGNOSIS — R2681 Unsteadiness on feet: Secondary | ICD-10-CM

## 2021-01-04 DIAGNOSIS — M6281 Muscle weakness (generalized): Secondary | ICD-10-CM

## 2021-01-04 DIAGNOSIS — R2689 Other abnormalities of gait and mobility: Secondary | ICD-10-CM | POA: Diagnosis not present

## 2021-01-04 MED ORDER — AMLODIPINE BESYLATE 5 MG PO TABS: 5.0000 mg | ORAL_TABLET | Freq: Every day | ORAL | 2 refills | Status: AC
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
## 2021-01-04 NOTE — Telephone Encounter (Signed)
Medication pended to this encounter.   Talbot Grumbling, RN

## 2021-01-06 ENCOUNTER — Encounter: Payer: Self-pay | Admitting: *Deleted

## 2021-01-09 ENCOUNTER — Telehealth: Payer: Self-pay

## 2021-01-09 ENCOUNTER — Telehealth: Payer: Self-pay | Admitting: Licensed Clinical Social Worker

## 2021-01-09 NOTE — Telephone Encounter (Signed)
LCSW received approval for assistance w/ one month of mortgage.  Pt daughter notified and appreciative. This will be completed via check request to [employer].  Remain available should any more updates arise.   Westley Hummer, MSW, [place]  [phone]- work cell phone (preferred) [phone]- desk phone

## 2021-01-09 NOTE — Telephone Encounter (Signed)
Received mortgage statement from pt daughter ([email or web address]) for consideration for assistance through [facility]. LCSW in need of w9 to complete request. Emailed pt daughter back that I will be able to submit once that is received.   Westley Hummer, MSW, [place]  [phone]- work cell phone (preferred) [phone]- desk phone

## 2021-01-09 NOTE — Telephone Encounter (Signed)
Daughter calls nurse line for two reasons.   #1 Daughter is requesting a letter written by PCP updating HR on her status. Daughter reports they are asking for an anticipation to return to work date.   #2 Daughter reports she lives in [place] and would like for her mother to come and visit for Christmas. Daughter would like PCPs opinion on travel, more than likely via airplane.   You have openings on later this week, however they are blocked a this time.   Please advise.
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
## 2021-01-11 ENCOUNTER — Other Ambulatory Visit: Payer: Self-pay

## 2021-01-11 ENCOUNTER — Ambulatory Visit: Payer: No Typology Code available for payment source | Attending: General Surgery

## 2021-01-11 DIAGNOSIS — M6281 Muscle weakness (generalized): Secondary | ICD-10-CM | POA: Insufficient documentation

## 2021-01-11 DIAGNOSIS — R2689 Other abnormalities of gait and mobility: Secondary | ICD-10-CM | POA: Insufficient documentation

## 2021-01-11 DIAGNOSIS — R2681 Unsteadiness on feet: Secondary | ICD-10-CM | POA: Insufficient documentation
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
## 2021-01-16 ENCOUNTER — Telehealth: Payer: Self-pay | Admitting: Licensed Clinical Social Worker

## 2021-01-16 NOTE — Telephone Encounter (Signed)
Patient Karen Robertson provided for 1 month of mortgage assistance.  I have updated pt daughter [name] that the check has been received and mailed directly to [employer].   Westley Hummer, MSW, [place]  [phone]- work cell phone (preferred) [phone]- desk phone

## 2021-01-18 ENCOUNTER — Ambulatory Visit: Payer: No Typology Code available for payment source

## 2021-01-18 ENCOUNTER — Other Ambulatory Visit: Payer: Self-pay

## 2021-01-18 DIAGNOSIS — R2689 Other abnormalities of gait and mobility: Secondary | ICD-10-CM

## 2021-01-18 DIAGNOSIS — R2681 Unsteadiness on feet: Secondary | ICD-10-CM

## 2021-01-18 DIAGNOSIS — M6281 Muscle weakness (generalized): Secondary | ICD-10-CM

## 2021-01-18 NOTE — Patient Instructions (Signed)
Access Code: [email or web address] URL: [email or web address] Date: 01/18/2021 Prepared by: Cherly Anderson  Exercises Walking March - 2 x daily - 7 x weekly - 1 sets - 6-8 reps Backward Walking with Counter Support - 2 x daily - 7 x weekly - 1 sets - 6-8 reps Side Stepping with Counter Support - 2 x daily - 7 x weekly - 1 sets - 6-8 reps Alternating Step Taps with Counter Support - 2 x daily - 7 x weekly - 1 sets - 10 reps Heel Toe Raises with Counter Support - 1 x daily - 7 x weekly - 3 sets - 10 reps
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
## 2021-02-01 ENCOUNTER — Telehealth (HOSPITAL_COMMUNITY): Payer: Self-pay | Admitting: *Deleted

## 2021-02-01 NOTE — Telephone Encounter (Signed)
Reaching out to patient to offer assistance regarding upcoming cardiac imaging study; pt verbalizes understanding of appt date/time, parking situation and where to check in, and verified current allergies; name and call back number provided for further questions should they arise  Gordy Clement RN Navigator [facility] [phone] office [phone] cell  Repeat from October  2022. She denies metal or claustrophobia and is aware to obtain blood work prior to cardiac MRI.

## 2021-02-03 ENCOUNTER — Telehealth: Payer: Self-pay | Admitting: Licensed Clinical Social Worker

## 2021-02-03 ENCOUNTER — Encounter: Payer: Self-pay | Admitting: Cardiology

## 2021-02-03 LAB — CBC
Hematocrit: 40.1 % (ref 34.0–46.6)
Hemoglobin: 13.3 g/dL (ref 11.1–15.9)
MCH: 29.1 pg (ref 26.6–33.0)
MCHC: 33.2 g/dL (ref 31.5–35.7)
MCV: 88 fL (ref 79–97)
Platelets: 289 10*3/uL (ref 150–450)
RBC: 4.57 x10E6/uL (ref 3.77–5.28)
RDW: 16.2 % — ABNORMAL HIGH (ref 11.7–15.4)
WBC: 4.1 10*3/uL (ref 3.4–10.8)

## 2021-02-03 LAB — BASIC METABOLIC PANEL
BUN/Creatinine Ratio: 14 (ref 9–23)
BUN: 8 mg/dL (ref 6–24)
CO2: 24 mmol/L (ref 20–29)
Calcium: 10 mg/dL (ref 8.7–10.2)
Chloride: 99 mmol/L (ref 96–106)
Creatinine, Ser: 0.59 mg/dL (ref 0.57–1.00)
Glucose: 91 mg/dL (ref 70–99)
Potassium: 3.6 mmol/L (ref 3.5–5.2)
Sodium: 139 mmol/L (ref 134–144)
eGFR: 104 mL/min/{1.73_m2} (ref >59)

## 2021-02-03 LAB — LIPID PANEL
Chol/HDL Ratio: 2.5 ratio (ref 0.0–4.4)
Cholesterol, Total: 131 mg/dL (ref 100–199)
HDL: 52 mg/dL (ref >39)
LDL Chol Calc (NIH): 56 mg/dL (ref 0–99)
Triglycerides: 133 mg/dL (ref 0–149)
VLDL Cholesterol Cal: 23 mg/dL (ref 5–40)

## 2021-02-03 NOTE — Telephone Encounter (Signed)
LCSW received an email from pt daughter [name], she shares that Patient Karen Robertson has not posted for her [employer]. LCSW had mailed the check 12/13. I sent f/u email to [employer] that confirm the check has not yet been deposited or returned at this time. LCSW encouraged pt daughter to let [place] know that the check had been sent and see if it has been received/just not posted yet at this time.  I will f/u next week too to check in on status.   Westley Hummer, MSW, [place]  [phone]- work cell phone (preferred) [phone]- desk phone

## 2021-02-03 NOTE — Telephone Encounter (Signed)
Message sent to Karen Robertson

## 2021-02-07 ENCOUNTER — Other Ambulatory Visit: Payer: Self-pay

## 2021-02-07 ENCOUNTER — Ambulatory Visit (HOSPITAL_COMMUNITY)
Admission: RE | Admit: 2021-02-07 | Discharge: 2021-02-07 | Disposition: A | Discharge status: Home / Self Care | Payer: No Typology Code available for payment source | Source: Ambulatory Visit | Attending: Cardiology | Admitting: Cardiology

## 2021-02-07 ENCOUNTER — Other Ambulatory Visit: Payer: Self-pay | Admitting: Family Medicine

## 2021-02-07 DIAGNOSIS — I513 Intracardiac thrombosis, not elsewhere classified: Secondary | ICD-10-CM

## 2021-02-07 IMAGING — MR MR CARD MORPHOLOGY WO/W CM
45 of 48 series · 45 of 48 positions shown · IV contrast (Contrast agent)
Comparison: none

CLINICAL DATA: Evaluate RA thrombus

EXAM:
CARDIAC MRI
TECHNIQUE: The patient was scanned on a 1.5 Tesla Siemens magnet. A dedicated
cardiac coil was used. Functional imaging was done using [employer]
sequences. [DATE], and 4 chamber views were done to assess for RWMA's.
Modified ALBRECHT rule using a short axis stack was used to
calculate an ejection fraction on a dedicated work station using
[employer] software. The patient received 10 cc of Gadavist. After 10
minutes inversion recovery sequences were used to assess for
infiltration and scar tissue.
CONTRAST:  10 cc  of Gadavist

[Series 4: [id number]_bh · axial · 8.0mm · 1.41mm/px · 1 of 16 slices shown]
[im 1/16]
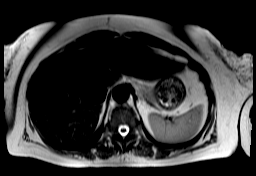

[Series 8: bSSFP · oblique · 8.0mm · 1.61mm/px · 1 of 25 slices shown (1 of 23)]
[im 1/25]
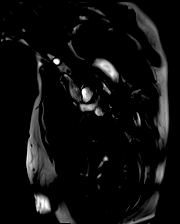

[Series 9: bSSFP · oblique · 8.0mm · 1.61mm/px · 1 of 25 slices shown (2 of 23)]
[im 1/25]
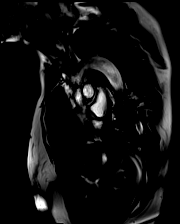

[Series 10: bSSFP · oblique · 8.0mm · 1.61mm/px · 1 of 25 slices shown (3 of 23)]
[im 1/25]
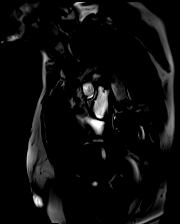

[Series 11: bSSFP · oblique · 8.0mm · 1.61mm/px · 1 of 25 slices shown (4 of 23)]
[im 1/25]
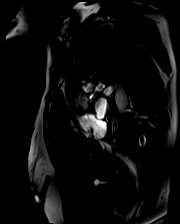

[Series 12: bSSFP · oblique · 8.0mm · 1.61mm/px · 1 of 25 slices shown (5 of 23)]
[im 1/25]
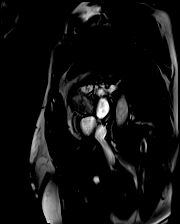

[Series 13: bSSFP · oblique · 8.0mm · 1.61mm/px · 1 of 25 slices shown (6 of 23)]
[im 1/25]
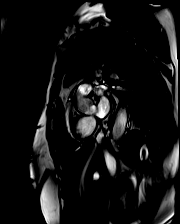

[Series 14: bSSFP · oblique · 8.0mm · 1.61mm/px · 1 of 25 slices shown (7 of 23)]
[im 1/25]
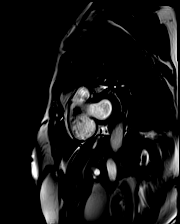

[Series 15: bSSFP · oblique · 8.0mm · 1.61mm/px · 1 of 25 slices shown (8 of 23)]
[im 1/25]
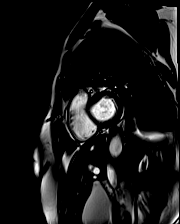

[Series 16: bSSFP · oblique · 8.0mm · 1.61mm/px · 1 of 25 slices shown (9 of 23)]
[im 1/25]
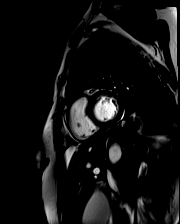

[Series 17: bSSFP · oblique · 8.0mm · 1.61mm/px · 1 of 25 slices shown (10 of 23)]
[im 1/25]
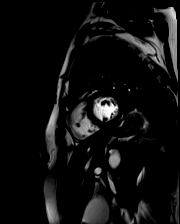

[Series 18: bSSFP · oblique · 8.0mm · 1.61mm/px · 1 of 25 slices shown (11 of 23)]
[im 1/25]
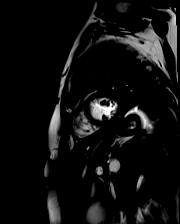

[Series 19: bSSFP · oblique · 8.0mm · 1.61mm/px · 1 of 25 slices shown (12 of 23)]
[im 1/25]
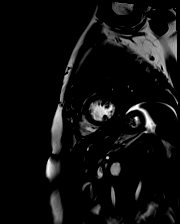

[Series 20: bSSFP · oblique · 8.0mm · 1.61mm/px · 1 of 25 slices shown (13 of 23)]
[im 1/25]
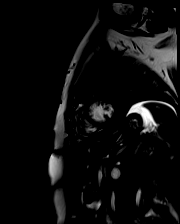

[Series 21: bSSFP · oblique · 8.0mm · 1.61mm/px · 1 of 25 slices shown (14 of 23)]
[im 1/25]
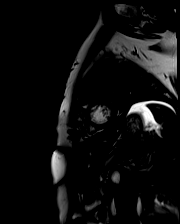

[Series 22: bSSFP · oblique · 8.0mm · 1.61mm/px · 1 of 25 slices shown (15 of 23)]
[im 1/25]
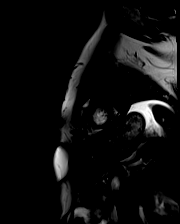

[Series 23: bSSFP · oblique · 8.0mm · 1.61mm/px · 1 of 25 slices shown (16 of 23)]
[im 1/25]
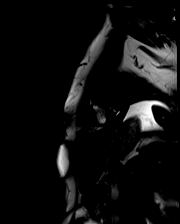

[Series 24: bSSFP · oblique · 8.0mm · 1.61mm/px · 1 of 25 slices shown (17 of 23)]
[im 1/25]
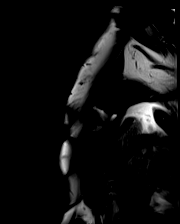

[Series 25: bSSFP · oblique · 8.0mm · 1.61mm/px · 1 of 25 slices shown (18 of 23)]
[im 1/25]
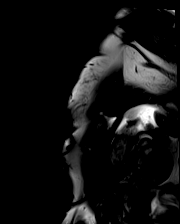

[Series 26: (id)_long_t1 · oblique · 8.0mm · 2.08mm/px · 1 of 24 slices shown]
[im 1/24]
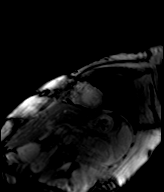

[Series 27: (id)_long_t1_moco · oblique · 8.0mm · 2.08mm/px · 1 of 24 slices shown]
[im 1/24]
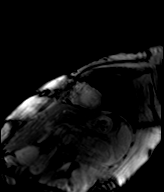

[Series 28: (id)_long_t1_moco_t1 · oblique · 8.0mm · 2.08mm/px · 1 of 3 slices shown]
[im 1/3]
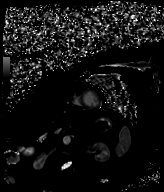

[Series 30: (id)_trufi · oblique · 8.0mm · 2.08mm/px · 1 of 9 slices shown]
[im 1/9]
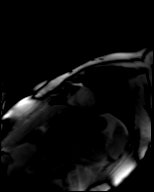

[Series 31: (id)_trufi_moco · oblique · 8.0mm · 2.08mm/px · 1 of 9 slices shown]
[im 1/9]
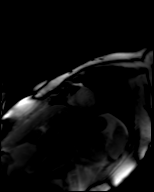

[Series 32: (id)_trufi_moco_t2 · oblique · 8.0mm · 2.08mm/px · 1 of 3 slices shown]
[im 1/3]
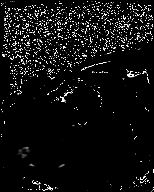

[Series 34: bSSFP · oblique · 6.0mm · 1.41mm/px · 1 of 25 slices shown (19 of 23)]
[im 1/25]
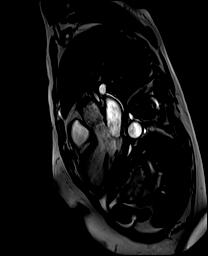

[Series 35: bSSFP · oblique · 6.0mm · 1.41mm/px · 1 of 25 slices shown (20 of 23)]
[im 1/25]
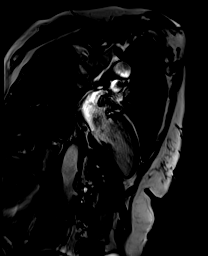

[Series 36: bSSFP · axial · 6.0mm · 1.41mm/px · 1 of 25 slices shown (21 of 23)]
[im 1/25]
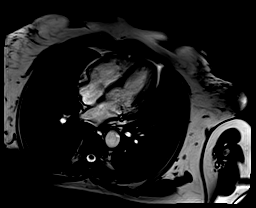

[Series 37: pre short axis · oblique · non-contrast · 8.0mm · 2.25mm/px · 1 of 10 slices shown (1 of 6)]
[im 1/10]
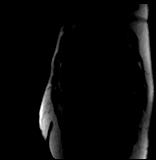

[Series 38: pre short axis · oblique · non-contrast · 8.0mm · 2.25mm/px · 1 of 10 slices shown (2 of 6)]
[im 1/10]
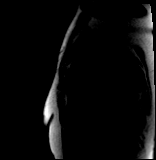

[Series 39: pre short axis · oblique · non-contrast · 8.0mm · 2.25mm/px · 1 of 10 slices shown (3 of 6)]
[im 1/10]
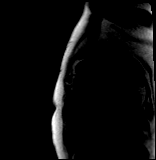

[Series 40: pre short axis · oblique · non-contrast · 8.0mm · 2.25mm/px · 1 of 10 slices shown (4 of 6)]
[im 1/10]
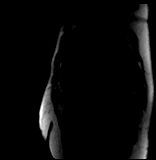

[Series 41: pre short axis · oblique · non-contrast · 8.0mm · 2.25mm/px · 1 of 10 slices shown (5 of 6)]
[im 1/10]
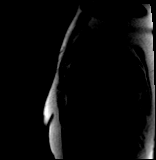

[Series 42: pre short axis · oblique · non-contrast · 8.0mm · 2.25mm/px · 1 of 10 slices shown (6 of 6)]
[im 1/10]
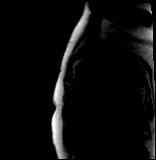

[Series 43: bSSFP · oblique · 6.0mm · 1.41mm/px · 1 of 25 slices shown (22 of 23)]
[im 1/25]
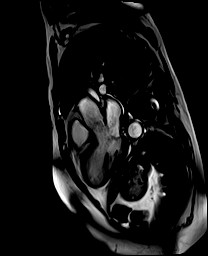

[Series 44: bSSFP · oblique · 8.0mm · 1.61mm/px · 1 of 25 slices shown (23 of 23)]
[im 1/25]
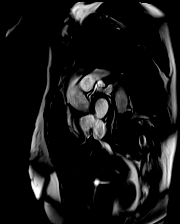

[Series 45: rest short axis · oblique · 8.0mm · 2.25mm/px · 1 of 10 slices shown (1 of 9)]
[im 1/10]
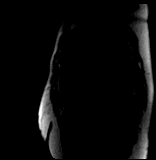

[Series 46: rest short axis · oblique · 8.0mm · 2.25mm/px · 1 of 9 slices shown (2 of 9)]
[im 1/9]
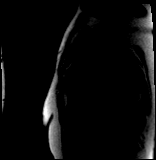

[Series 47: rest short axis · oblique · 8.0mm · 2.25mm/px · 1 of 9 slices shown (3 of 9)]
[im 1/9]
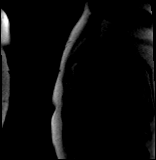

[Series 48: rest short axis · oblique · 8.0mm · 2.25mm/px · 1 of 60 slices shown (4 of 9)]
[im 1/60]
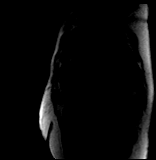

[Series 49: rest short axis · oblique · 8.0mm · 2.25mm/px · 1 of 60 slices shown (5 of 9)]
[im 1/60]
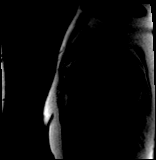

[Series 50: rest short axis · oblique · 8.0mm · 2.25mm/px · 1 of 60 slices shown (6 of 9)]
[im 1/60]
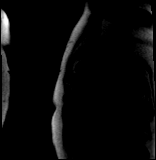

[Series 51: rest short axis · oblique · 8.0mm · 2.25mm/px · 1 of 60 slices shown (7 of 9)]
[im 1/60]
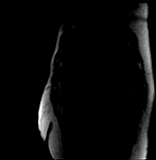

[Series 52: rest short axis · oblique · 8.0mm · 2.25mm/px · 1 of 60 slices shown (8 of 9)]
[im 1/60]
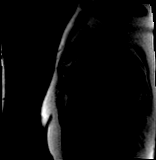

[Series 53: rest short axis · oblique · 8.0mm · 2.25mm/px · 1 of 60 slices shown (9 of 9)]
[im 1/60]
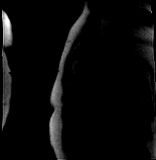

[45 of 48 positions shown; findings below may reference images not displayed]

FINDINGS: Left ventricle:

-Normal size

-Normal systolic function

-Normal ECV (28%)

-No LGE

LV EF:  61% (Normal 56-78%)

Absolute volumes:

LV EDV: 86mL (Normal 52-141 mL)

LV ESV: 34mL (Normal 13-51 mL)

LV SV: 52mL (Normal 33-97 mL)

CO: 4.6L/min (Normal 2.7-6.0 L/min)

Indexed volumes:

LV EDV: 49mL/sq-m (Normal 41-81 mL/sq-m)

LV ESV: 19mL/sq-m (Normal 12-21 mL/sq-m)

LV SV: 30mL/sq-m (Normal 26-56 mL/sq-m)

CI: 2.6L/min/sq-m (Normal 1.8-3.8 L/min/sq-m)

Right ventricle: Normal size and systolic function

RV EF: 63% (Normal 47-80%)

Absolute volumes:

RV EDV: 87mL (Normal 58-154 mL)

RV ESV: 32mL (Normal 12-68 mL)

RV SV: 55mL (Normal 35-98 mL)

CO: 4.8L/min (Normal 2.7-6 L/min)

Indexed volumes:

RV EDV: 50mL/sq-m (Normal 48-87 mL/sq-m)

RV ESV: 18mL/sq-m (Normal 11-28 mL/sq-m)

RV SV: 31mL/sq-m (Normal 27-57 mL/sq-m)

CI: 2.8L/min/sq-m (Normal 1.8-3.8 L/min/sq-m)

Left atrium: Normal size

Right atrium: Normal size. Mass consistent with thrombus measuring
11mm x 8mm at junction of SVC and RA, grossly unchanged from prior
MRI

Mitral valve: Trivial regurgitation

Aortic valve: No regurgitation

Tricuspid valve: Trivial regurgitation

Pulmonic valve: No regurgitation

Aorta: Normal proximal ascending aorta

Pericardium: Normal
IMPRESSION: 1. Mass consistent with thrombus measuring 11mm x 8mm at junction of
SVC and right atrium, grossly unchanged from prior cardiac MRI on
[DATE]

[DATE].  Normal LV size and systolic function (EF 61%).  No LGE

3.  Normal RV size and systolic function (EF 63%)

## 2021-02-07 MED ORDER — GADOBUTROL 1 MMOL/ML IV SOLN
10.0000 mL | Freq: Once | INTRAVENOUS | Status: AC | PRN
  Administered 2021-02-07: 10 mL via INTRAVENOUS

## 2021-02-08 NOTE — Telephone Encounter (Signed)
See result note, recommend referral to hematology for evaluation prior to upcoming surgery

## 2021-02-10 ENCOUNTER — Telehealth: Payer: Self-pay | Admitting: Cardiology

## 2021-02-10 DIAGNOSIS — I513 Intracardiac thrombosis, not elsewhere classified: Secondary | ICD-10-CM

## 2021-02-10 NOTE — Telephone Encounter (Signed)
Pt is reaching out for mri results... please advise

## 2021-02-10 NOTE — Telephone Encounter (Signed)
Spoke with patient and gave her the results of her CCTA per Dr. 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 I let her know that the hematology practice will call her to set up an appointment. She wanted the results uploaded to [place] as well.

## 2021-02-10 NOTE — Telephone Encounter (Signed)
Pt returning phone call... please advise  

## 2021-02-10 NOTE — Telephone Encounter (Signed)
No answer, no voicemail.

## 2021-02-10 NOTE — Addendum Note (Signed)
Addended by: Betha Loa F on: 02/10/2021 02:50 PM   Modules accepted: Orders

## 2021-02-13 ENCOUNTER — Inpatient Hospital Stay (HOSPITAL_COMMUNITY)
Admission: EM | Admit: 2021-02-13 | Discharge: 2021-02-19 | DRG: 472 | Disposition: A | Discharge status: Home / Self Care | Payer: No Typology Code available for payment source | Attending: Neurosurgery | Admitting: Neurosurgery

## 2021-02-13 ENCOUNTER — Other Ambulatory Visit: Payer: Self-pay | Admitting: Neurosurgery

## 2021-02-13 ENCOUNTER — Other Ambulatory Visit: Payer: Self-pay

## 2021-02-13 ENCOUNTER — Emergency Department (HOSPITAL_COMMUNITY): Payer: No Typology Code available for payment source

## 2021-02-13 DIAGNOSIS — I513 Intracardiac thrombosis, not elsewhere classified: Secondary | ICD-10-CM | POA: Diagnosis present

## 2021-02-13 DIAGNOSIS — I10 Essential (primary) hypertension: Secondary | ICD-10-CM | POA: Diagnosis present

## 2021-02-13 DIAGNOSIS — M4312 Spondylolisthesis, cervical region: Secondary | ICD-10-CM | POA: Diagnosis present

## 2021-02-13 DIAGNOSIS — W010XXA Fall on same level from slipping, tripping and stumbling without subsequent striking against object, initial encounter: Secondary | ICD-10-CM | POA: Diagnosis present

## 2021-02-13 DIAGNOSIS — Y908 Blood alcohol level of 240 mg/100 ml or more: Secondary | ICD-10-CM | POA: Diagnosis present

## 2021-02-13 DIAGNOSIS — M4802 Spinal stenosis, cervical region: Principal | ICD-10-CM

## 2021-02-13 DIAGNOSIS — S13170A Subluxation of C6/C7 cervical vertebrae, initial encounter: Secondary | ICD-10-CM | POA: Diagnosis present

## 2021-02-13 DIAGNOSIS — M532X2 Spinal instabilities, cervical region: Secondary | ICD-10-CM | POA: Diagnosis present

## 2021-02-13 DIAGNOSIS — I5189 Other ill-defined heart diseases: Secondary | ICD-10-CM | POA: Diagnosis not present

## 2021-02-13 DIAGNOSIS — Z20822 Contact with and (suspected) exposure to covid-19: Secondary | ICD-10-CM | POA: Diagnosis present

## 2021-02-13 DIAGNOSIS — R296 Repeated falls: Secondary | ICD-10-CM | POA: Diagnosis present

## 2021-02-13 DIAGNOSIS — Z5181 Encounter for therapeutic drug level monitoring: Secondary | ICD-10-CM | POA: Diagnosis not present

## 2021-02-13 DIAGNOSIS — Z79899 Other long term (current) drug therapy: Secondary | ICD-10-CM

## 2021-02-13 DIAGNOSIS — Z9851 Tubal ligation status: Secondary | ICD-10-CM

## 2021-02-13 DIAGNOSIS — S12600A Unspecified displaced fracture of seventh cervical vertebra, initial encounter for closed fracture: Secondary | ICD-10-CM | POA: Diagnosis present

## 2021-02-13 DIAGNOSIS — M4712 Other spondylosis with myelopathy, cervical region: Secondary | ICD-10-CM | POA: Diagnosis present

## 2021-02-13 DIAGNOSIS — M4722 Other spondylosis with radiculopathy, cervical region: Secondary | ICD-10-CM | POA: Diagnosis present

## 2021-02-13 DIAGNOSIS — F10929 Alcohol use, unspecified with intoxication, unspecified: Secondary | ICD-10-CM

## 2021-02-13 DIAGNOSIS — Z419 Encounter for procedure for purposes other than remedying health state, unspecified: Secondary | ICD-10-CM

## 2021-02-13 DIAGNOSIS — S129XXD Fracture of neck, unspecified, subsequent encounter: Secondary | ICD-10-CM

## 2021-02-13 DIAGNOSIS — E785 Hyperlipidemia, unspecified: Secondary | ICD-10-CM | POA: Diagnosis present

## 2021-02-13 DIAGNOSIS — W19XXXA Unspecified fall, initial encounter: Secondary | ICD-10-CM

## 2021-02-13 DIAGNOSIS — S0181XA Laceration without foreign body of other part of head, initial encounter: Secondary | ICD-10-CM | POA: Diagnosis present

## 2021-02-13 DIAGNOSIS — F10129 Alcohol abuse with intoxication, unspecified: Secondary | ICD-10-CM | POA: Diagnosis present

## 2021-02-13 DIAGNOSIS — G4733 Obstructive sleep apnea (adult) (pediatric): Secondary | ICD-10-CM | POA: Diagnosis present

## 2021-02-13 DIAGNOSIS — S12500A Unspecified displaced fracture of sixth cervical vertebra, initial encounter for closed fracture: Secondary | ICD-10-CM | POA: Diagnosis present

## 2021-02-13 DIAGNOSIS — Z7901 Long term (current) use of anticoagulants: Secondary | ICD-10-CM | POA: Diagnosis not present

## 2021-02-13 LAB — TYPE AND SCREEN
ABO/RH(D): A POS
Antibody Screen: NEGATIVE

## 2021-02-13 LAB — COMPREHENSIVE METABOLIC PANEL
ALT: 13 U/L (ref 0–44)
AST: 25 U/L (ref 15–41)
Albumin: 3.9 g/dL (ref 3.5–5.0)
Alkaline Phosphatase: 66 U/L (ref 38–126)
Anion gap: 16 — ABNORMAL HIGH (ref 5–15)
BUN: 9 mg/dL (ref 6–20)
CO2: 17 mmol/L — ABNORMAL LOW (ref 22–32)
Calcium: 9.4 mg/dL (ref 8.9–10.3)
Chloride: 102 mmol/L (ref 98–111)
Creatinine, Ser: 0.64 mg/dL (ref 0.44–1.00)
GFR, Estimated: >60 mL/min (ref >60)
Glucose, Bld: 98 mg/dL (ref 70–99)
Potassium: 3.1 mmol/L — ABNORMAL LOW (ref 3.5–5.1)
Sodium: 135 mmol/L (ref 135–145)
Total Bilirubin: 0.9 mg/dL (ref 0.3–1.2)
Total Protein: 7.3 g/dL (ref 6.5–8.1)

## 2021-02-13 LAB — CBC WITH DIFFERENTIAL/PLATELET
Abs Immature Granulocytes: 0.02 10*3/uL (ref 0.00–0.07)
Basophils Absolute: 0 10*3/uL (ref 0.0–0.1)
Basophils Relative: 0 %
Eosinophils Absolute: 0.1 10*3/uL (ref 0.0–0.5)
Eosinophils Relative: 1 %
HCT: 41.7 % (ref 36.0–46.0)
Hemoglobin: 14 g/dL (ref 12.0–15.0)
Immature Granulocytes: 0 %
Lymphocytes Relative: 39 %
Lymphs Abs: 3.1 10*3/uL (ref 0.7–4.0)
MCH: 29.9 pg (ref 26.0–34.0)
MCHC: 33.6 g/dL (ref 30.0–36.0)
MCV: 88.9 fL (ref 80.0–100.0)
Monocytes Absolute: 0.5 10*3/uL (ref 0.1–1.0)
Monocytes Relative: 6 %
Neutro Abs: 4.3 10*3/uL (ref 1.7–7.7)
Neutrophils Relative %: 54 %
Platelets: 304 10*3/uL (ref 150–400)
RBC: 4.69 MIL/uL (ref 3.87–5.11)
RDW: 16.6 % — ABNORMAL HIGH (ref 11.5–15.5)
WBC: 8 10*3/uL (ref 4.0–10.5)
nRBC: 0 % (ref 0.0–0.2)

## 2021-02-13 LAB — PROTIME-INR
INR: 1 (ref 0.8–1.2)
Prothrombin Time: 13.3 seconds (ref 11.4–15.2)

## 2021-02-13 LAB — RESP PANEL BY RT-PCR (FLU A&B, COVID) ARPGX2
Influenza A by PCR: NEGATIVE
Influenza B by PCR: NEGATIVE
SARS Coronavirus 2 by RT PCR: NEGATIVE

## 2021-02-13 LAB — HEPARIN LEVEL (UNFRACTIONATED): Heparin Unfractionated: 1.09 IU/mL — ABNORMAL HIGH (ref 0.30–0.70)

## 2021-02-13 LAB — ABO/RH: ABO/RH(D): A POS

## 2021-02-13 LAB — ETHANOL: Alcohol, Ethyl (B): 270 mg/dL — ABNORMAL HIGH (ref <10)

## 2021-02-13 LAB — APTT: aPTT: 72 seconds — ABNORMAL HIGH (ref 24–36)

## 2021-02-13 IMAGING — MR MR CERVICAL SPINE W/O CM
5 of 6 series · 25 of 48 positions shown · non-contrast
Comparison: CT cervical spine [WL] hours today, and [DATE].
COMPARISON: CT cervical spine [WL] hours today, and [DATE].

Addendum:
CLINICAL DATA: 60-year-old female status post fall with head
injury. CT reveals C6-C7 fracture dislocation with anterolisthesis,
facet disruption.

EXAM:
MRI CERVICAL SPINE WITHOUT CONTRAST
TECHNIQUE: Multiplanar, multisequence MR imaging of the cervical spine was
performed. No intravenous contrast was administered.

[Series 5: T2 · sagittal · 3.0mm · 0.69mm/px · 3 of 15 slices shown (1 of 2)]
[im 1/15]
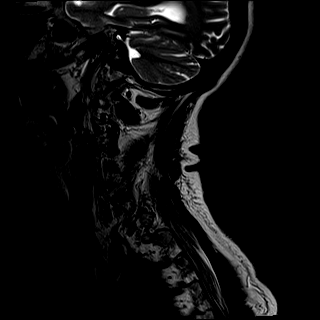
[im 8/15]
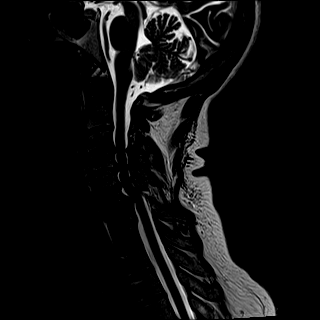
[im 15/15]
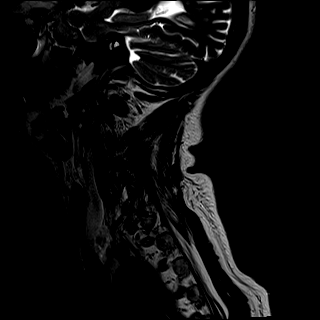

[Series 6: T1 · sagittal · 3.0mm · 0.69mm/px · 3 of 15 slices shown]
[im 1/15]
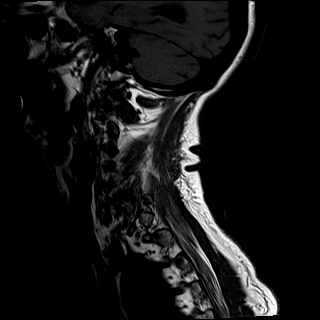
[im 8/15]
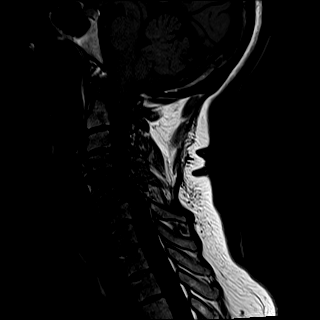
[im 15/15]
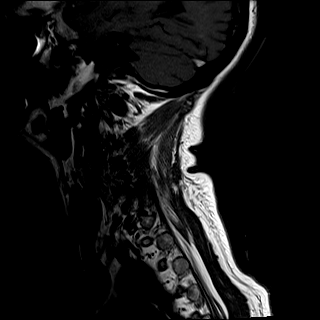

[Series 7: STIR · sagittal · 3.0mm · 0.86mm/px · 3 of 15 slices shown]
[im 1/15]
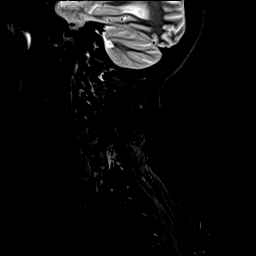
[im 8/15]
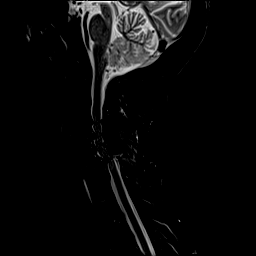
[im 15/15]
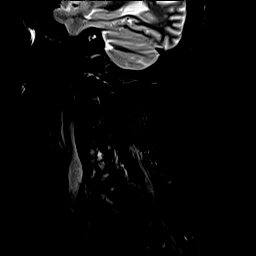

[Series 8: T2 · axial · 3.0mm · 0.66mm/px · z∈[-139,-19]mm · 8 of 40 slices shown (2 of 2)]
[im 1/40]
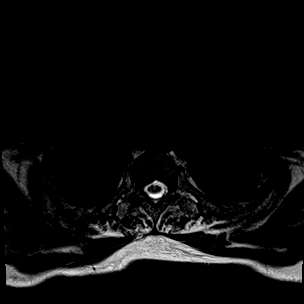
[im 6/40]
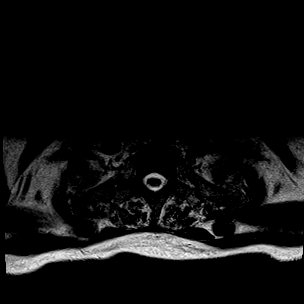
[im 12/40]
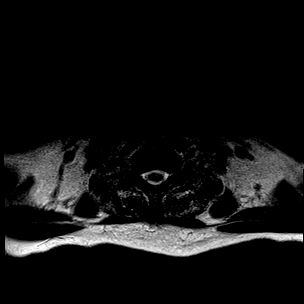
[im 17/40]
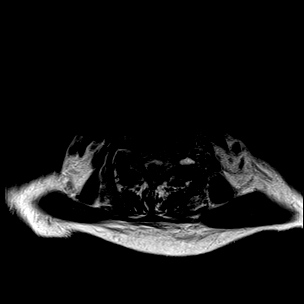
[im 23/40]
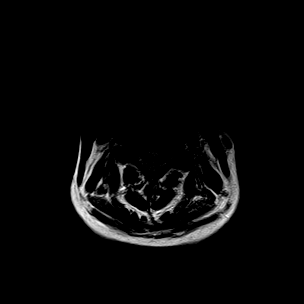
[im 28/40]
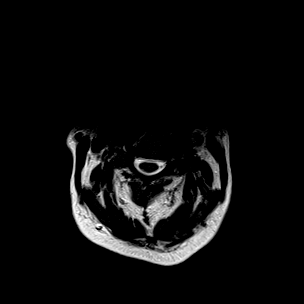
[im 34/40]
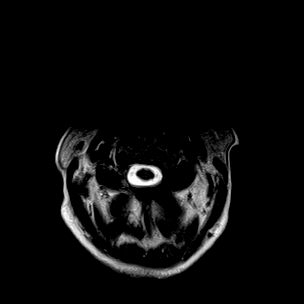
[im 40/40]
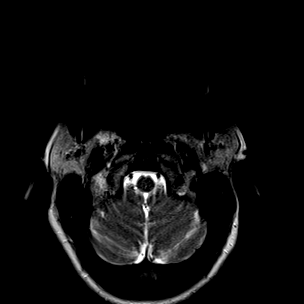

[Series 9: GRE · axial · 3.0mm · 0.39mm/px · z∈[-139,-19]mm · 8 of 40 slices shown]
[im 1/40]
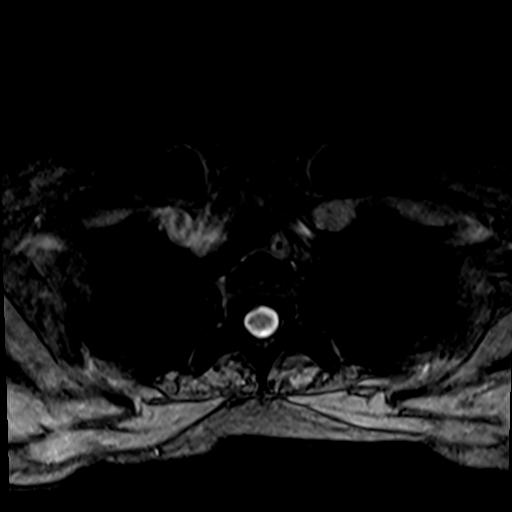
[im 6/40]
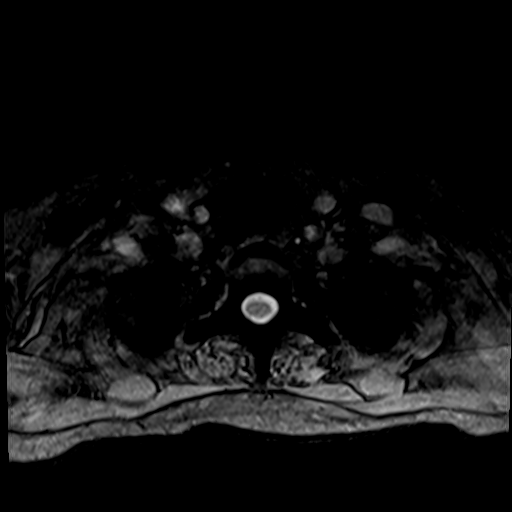
[im 12/40]
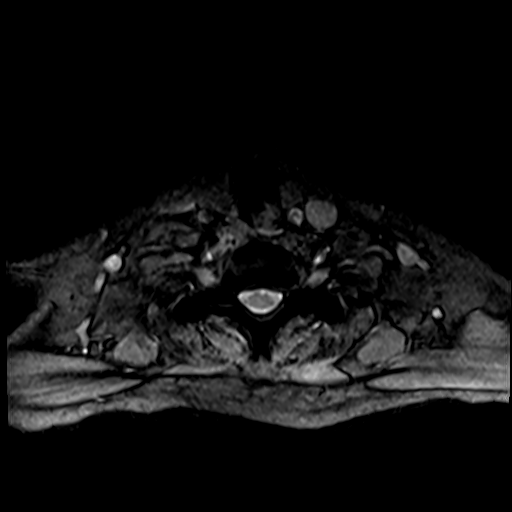
[im 17/40]
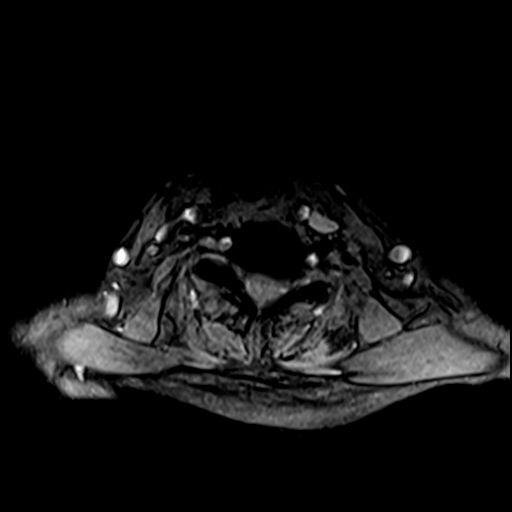
[im 23/40]
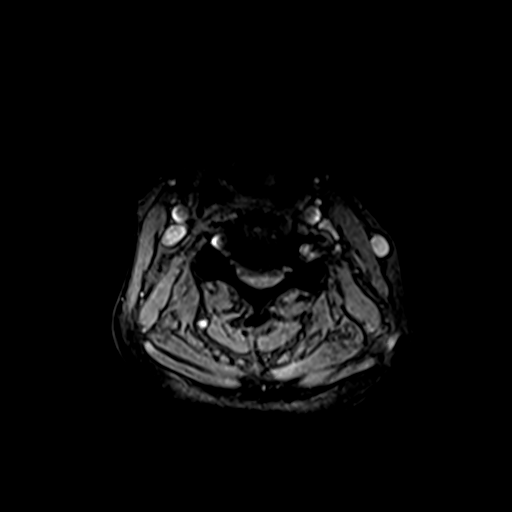
[im 28/40]
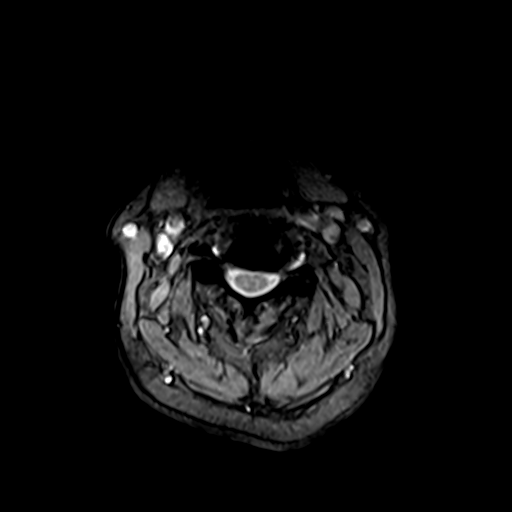
[im 34/40]
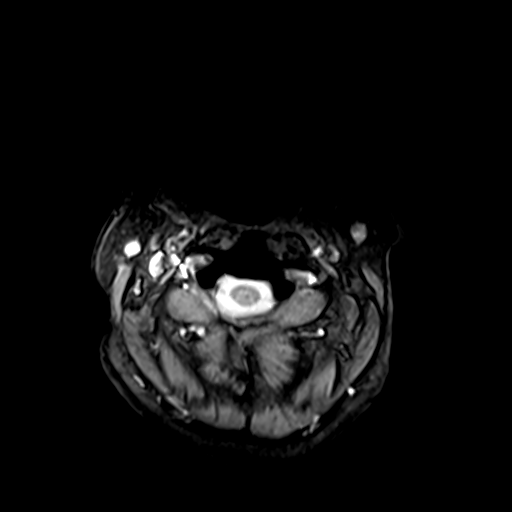
[im 40/40]
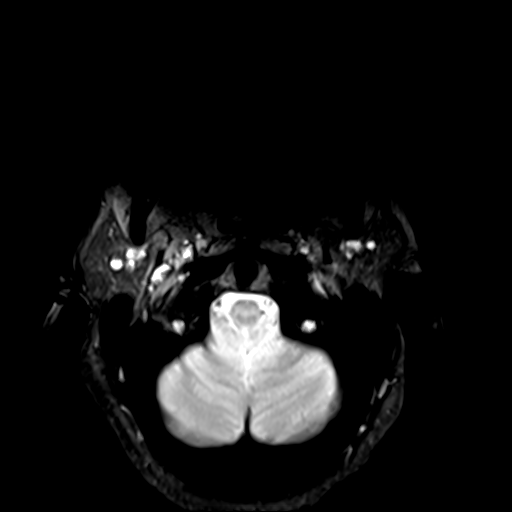

[25 of 48 positions shown; findings below may reference images not displayed]

FINDINGS: Alignment: Abnormal C6-C7 anterolisthesis is stable from earlier
today. Subtle focal lordosis at C6-C7 also new but stable.
Relatively preserved cervical lordosis elsewhere.

Vertebrae: Bilateral C6-C7 facet injury, with fracture of the right
C7 superior articulating facet better demonstrated by CT. No
confluent bone marrow edema outside of the right C7 facet.
Background degenerative marrow signal changes in much of the
cervical spine.

There are mild chronic T2 and T3 superior endplate compressions
which are stable from last year.

Cord: Abnormal spinal cord mass effect, combined degenerative and
posttraumatic as detailed below. No definite abnormal cord signal on
T2 imaging, although questionable spinal cord heterogeneity on
sagittal STIR series 7, image 9. No cord hemorrhage on axial T2*.

Posterior Fossa, vertebral arteries, paraspinal tissues:
Cervicomedullary junction is within normal limits. Negative visible
posterior fossa. Preserved major vascular flow voids in the neck.
Negative visible lung apices.

Abnormal though mild prevertebral soft tissue T2 and STIR signal
from C6-T1 consistent with anterior ligamentous injury. Posterior
longitudinal ligamentous injury by MOYITO of the abnormal vertebral
alignment. Facet capsular injury. But only mild abnormal
interspinous signal. Mild patchy posterior paraspinal muscle edema
such as on the left series 7, image 15.

Above C6-C7 no convincing ligamentous injury.

Disc levels:

C2-C3:  Degenerative changes without significant stenosis.

C3-C4: Disc, endplate degeneration and facet and ligament flavum
hypertrophy. Mild spinal stenosis. Mild if any cord mass effect.
Mild to moderate bilateral C4 foraminal stenosis.

C4-C5: Disc space loss with circumferential disc osteophyte complex.
Somewhat bulky broad-based posterior component. Mild to moderate
facet and ligament flavum hypertrophy. Mild spinal stenosis and
spinal cord mass effect. Moderate to severe bilateral C5 foraminal
stenosis.

C5-C6: Disc space loss with circumferential disc osteophyte complex.
Broad-based posterior component with facet and ligament flavum
hypertrophy. Mild spinal stenosis and cord mass effect. Moderate to
severe bilateral C6 foraminal stenosis greater on the left.

C6-C7: Disc space loss. Anterolisthesis. Abnormal facets greater on
the right. Posttraumatic mild spinal stenosis and spinal cord mass
effect. Spinal cord here detailed above. Mild to moderate left but
severe right C7 foraminal stenosis.

C7-T1: Disc bulging, facet and endplate hypertrophy. No spinal
stenosis, but foraminal disc with moderate to severe bilateral C8
foraminal stenosis.

No visible upper thoracic spinal stenosis.
IMPRESSION: 1. Evidence of 3-column injury associated with the C6-C7 facet
fractures and traumatic anterolisthesis.
ALL and PLL injury there. Mild interspinous ligament injury
suspected. Mild regional muscle edema.
Posttraumatic mild spinal stenosis and mild cord compression there,
with questionable faint Cord Contusion visible only on STIR. No cord
hemorrhage.

2. Superimposed multilevel degenerative cervical spinal stenosis and
spinal cord mass effect elsewhere. No other cord signal abnormality.

3. Multilevel moderate to severe degenerative neural foraminal
stenosis at the bilateral C5, C6, and C8 nerve levels.

ADDENDUM:
Study discussed by telephone with Dr. MOYITO in the ED on
[DATE] at [WL] hours.

*** End of Addendum ***
FINDINGS: Alignment: Abnormal C6-C7 anterolisthesis is stable from earlier
today. Subtle focal lordosis at C6-C7 also new but stable.
Relatively preserved cervical lordosis elsewhere.

Vertebrae: Bilateral C6-C7 facet injury, with fracture of the right
C7 superior articulating facet better demonstrated by CT. No
confluent bone marrow edema outside of the right C7 facet.
Background degenerative marrow signal changes in much of the
cervical spine.

There are mild chronic T2 and T3 superior endplate compressions
which are stable from last year.

Cord: Abnormal spinal cord mass effect, combined degenerative and
posttraumatic as detailed below. No definite abnormal cord signal on
T2 imaging, although questionable spinal cord heterogeneity on
sagittal STIR series 7, image 9. No cord hemorrhage on axial T2*.

Posterior Fossa, vertebral arteries, paraspinal tissues:
Cervicomedullary junction is within normal limits. Negative visible
posterior fossa. Preserved major vascular flow voids in the neck.
Negative visible lung apices.

Abnormal though mild prevertebral soft tissue T2 and STIR signal
from C6-T1 consistent with anterior ligamentous injury. Posterior
longitudinal ligamentous injury by MOYITO of the abnormal vertebral
alignment. Facet capsular injury. But only mild abnormal
interspinous signal. Mild patchy posterior paraspinal muscle edema
such as on the left series 7, image 15.

Above C6-C7 no convincing ligamentous injury.

Disc levels:

C2-C3:  Degenerative changes without significant stenosis.

C3-C4: Disc, endplate degeneration and facet and ligament flavum
hypertrophy. Mild spinal stenosis. Mild if any cord mass effect.
Mild to moderate bilateral C4 foraminal stenosis.

C4-C5: Disc space loss with circumferential disc osteophyte complex.
Somewhat bulky broad-based posterior component. Mild to moderate
facet and ligament flavum hypertrophy. Mild spinal stenosis and
spinal cord mass effect. Moderate to severe bilateral C5 foraminal
stenosis.

C5-C6: Disc space loss with circumferential disc osteophyte complex.
Broad-based posterior component with facet and ligament flavum
hypertrophy. Mild spinal stenosis and cord mass effect. Moderate to
severe bilateral C6 foraminal stenosis greater on the left.

C6-C7: Disc space loss. Anterolisthesis. Abnormal facets greater on
the right. Posttraumatic mild spinal stenosis and spinal cord mass
effect. Spinal cord here detailed above. Mild to moderate left but
severe right C7 foraminal stenosis.

C7-T1: Disc bulging, facet and endplate hypertrophy. No spinal
stenosis, but foraminal disc with moderate to severe bilateral C8
foraminal stenosis.

No visible upper thoracic spinal stenosis.
IMPRESSION: 1. Evidence of 3-column injury associated with the C6-C7 facet
fractures and traumatic anterolisthesis.
ALL and PLL injury there. Mild interspinous ligament injury
suspected. Mild regional muscle edema.
Posttraumatic mild spinal stenosis and mild cord compression there,
with questionable faint Cord Contusion visible only on STIR. No cord
hemorrhage.

2. Superimposed multilevel degenerative cervical spinal stenosis and
spinal cord mass effect elsewhere. No other cord signal abnormality.

3. Multilevel moderate to severe degenerative neural foraminal
stenosis at the bilateral C5, C6, and C8 nerve levels.

## 2021-02-13 IMAGING — CT CT HEAD W/O CM
4 series · 16 of 47 positions shown, 18 images · non-contrast
Comparison: Head CT dated [DATE].

CLINICAL DATA: Trauma.

EXAM:
CT HEAD WITHOUT CONTRAST
CT CERVICAL SPINE WITHOUT CONTRAST
TECHNIQUE: Multidetector CT imaging of the head and cervical spine was
performed following the standard protocol without intravenous
contrast. Multiplanar CT image reconstructions of the cervical spine
were also generated.

[Series 3: head without · axial · non-contrast · 0.40mm/px · z∈[+268,+388]mm · 7 of 34 slices shown, 9 images]
[im 5/34  brain]
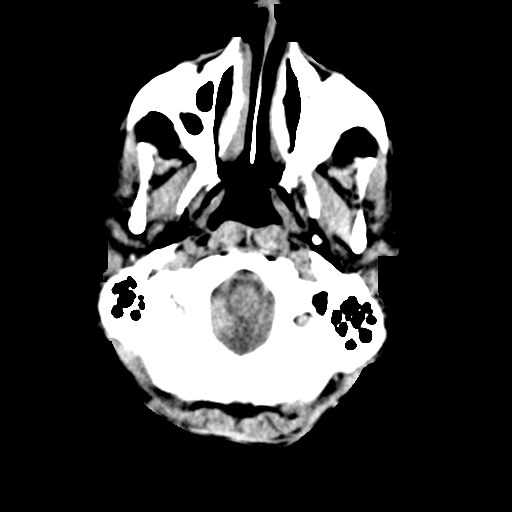
[im 5/34  bone]
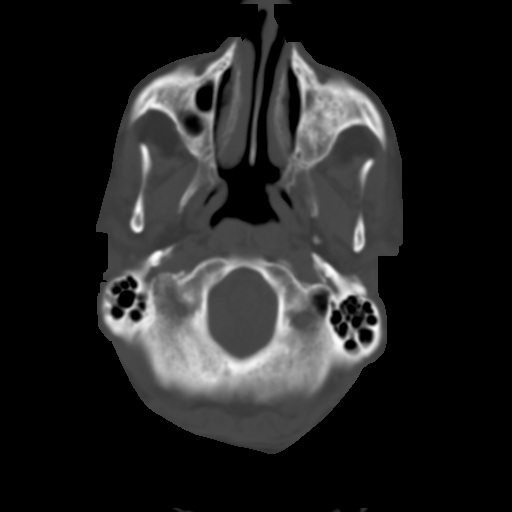
[im 9/34  brain]
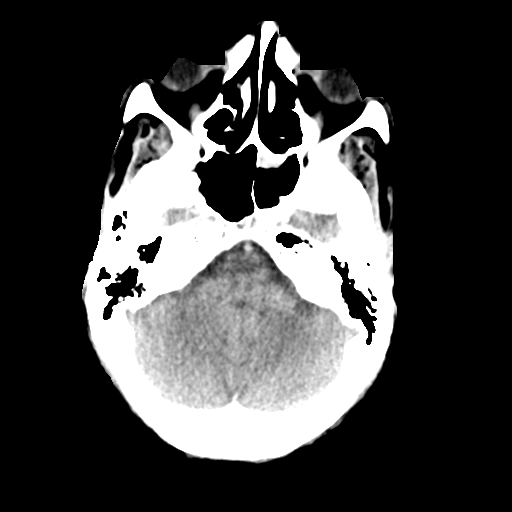
[im 13/34  brain]
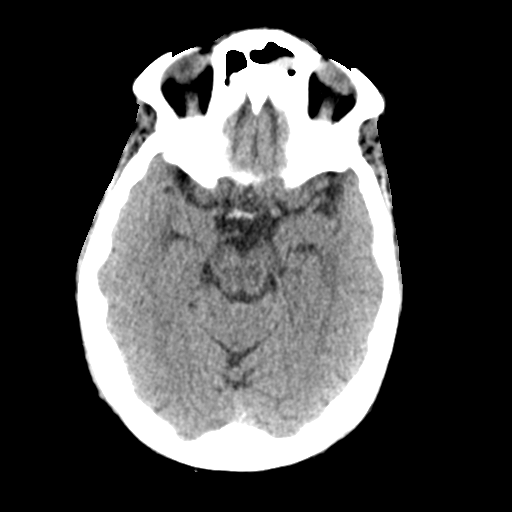
[im 17/34  brain]
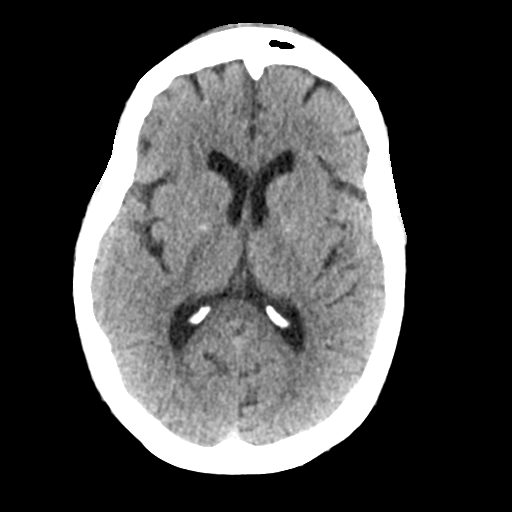
[im 21/34  brain]
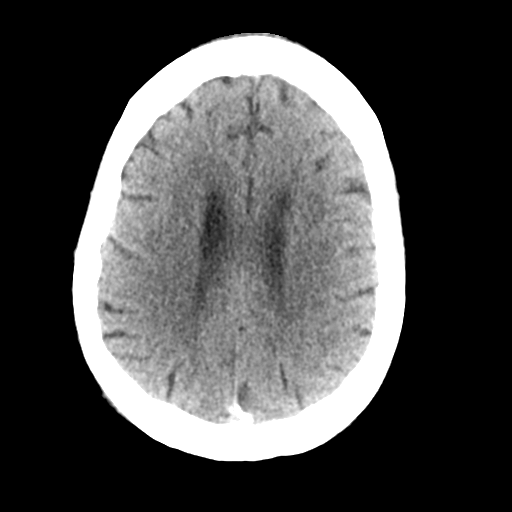
[im 21/34  bone]
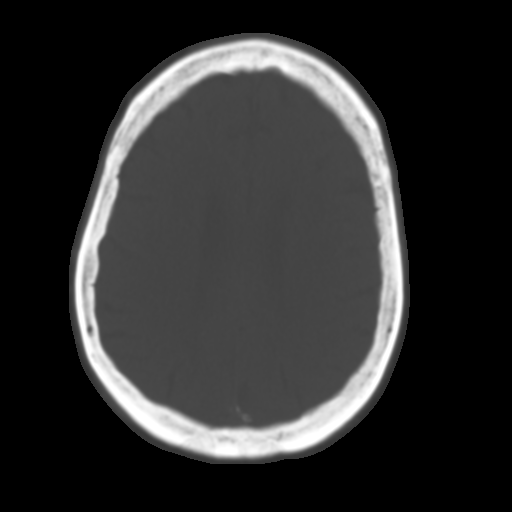
[im 25/34  brain]
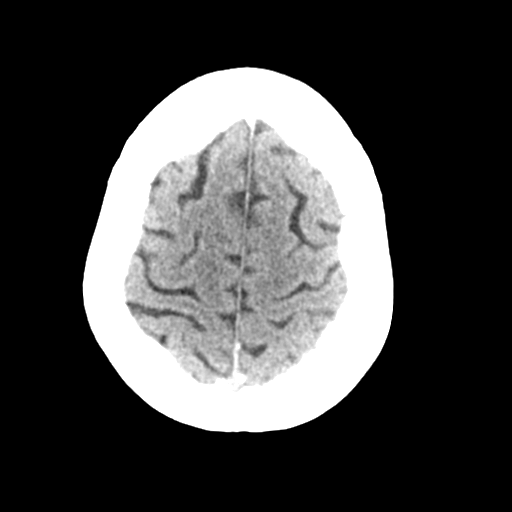
[im 29/34  brain]
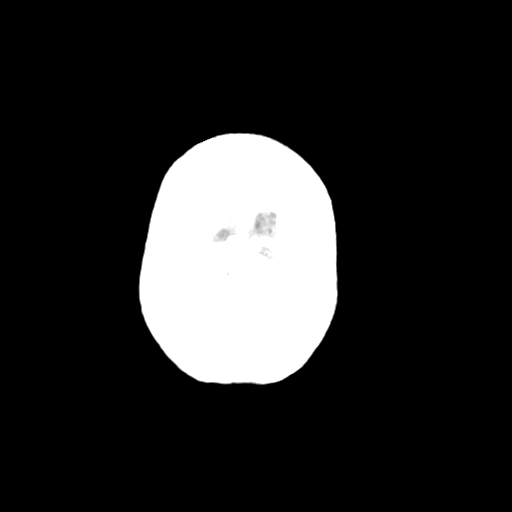

[Series 4: head bone · axial · 0.40mm/px · z∈[+264,+296]mm · 3 of 84 slices shown]
[im 9/84  bone]
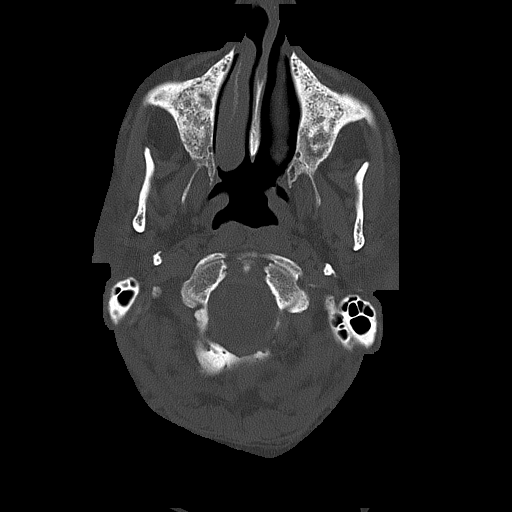
[im 17/84  bone]
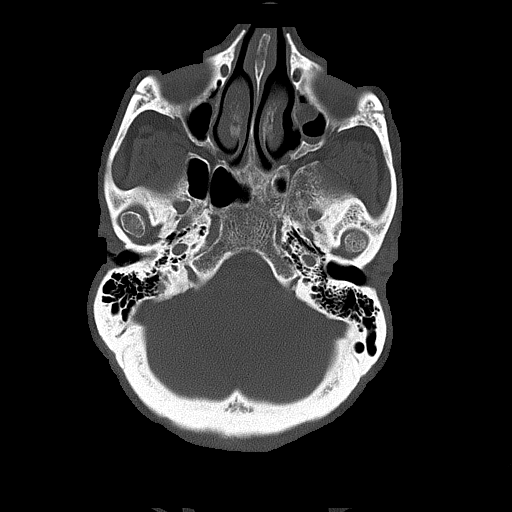
[im 25/84  bone]
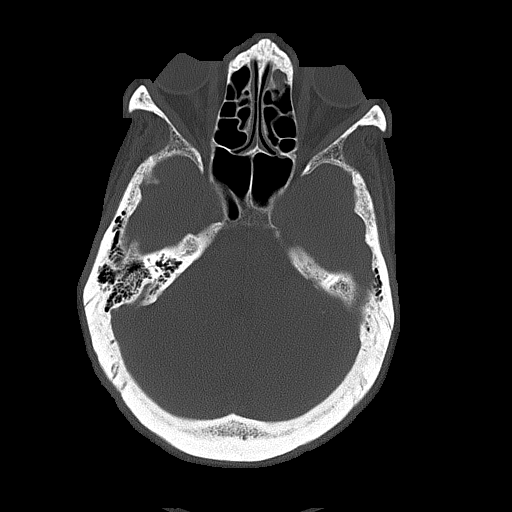

[Series 5: head without cor · coronal · non-contrast · 0.33mm/px · 3 of 67 slices shown]
[im 23/67  brain]
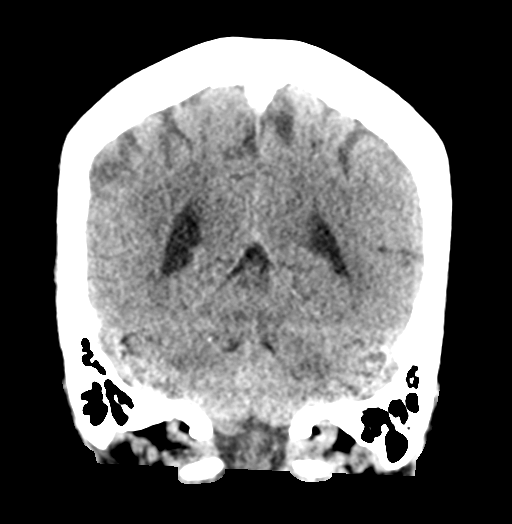
[im 30/67  brain]
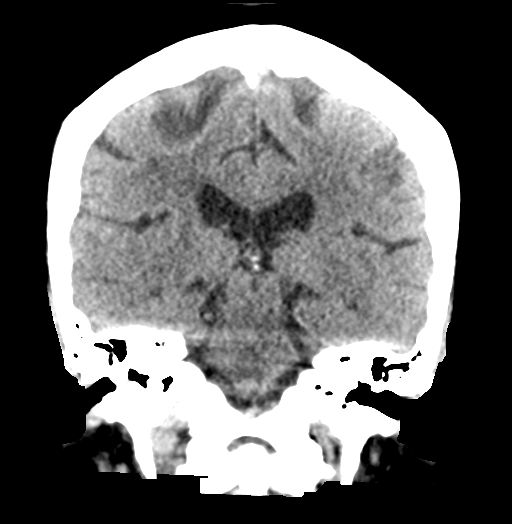
[im 37/67  brain]
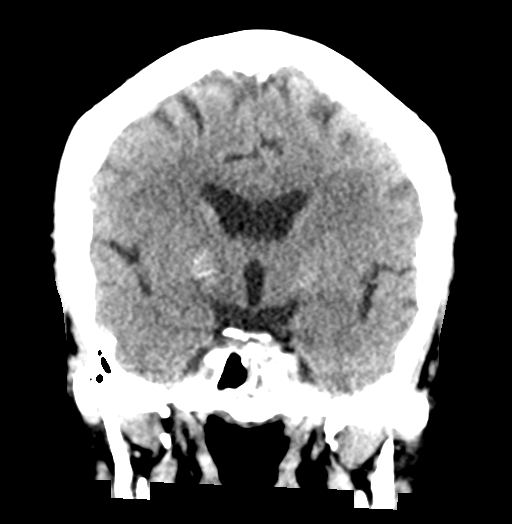

[Series 6: head without sag · sagittal · non-contrast · 0.34mm/px · 3 of 57 slices shown]
[im 19/57  brain]
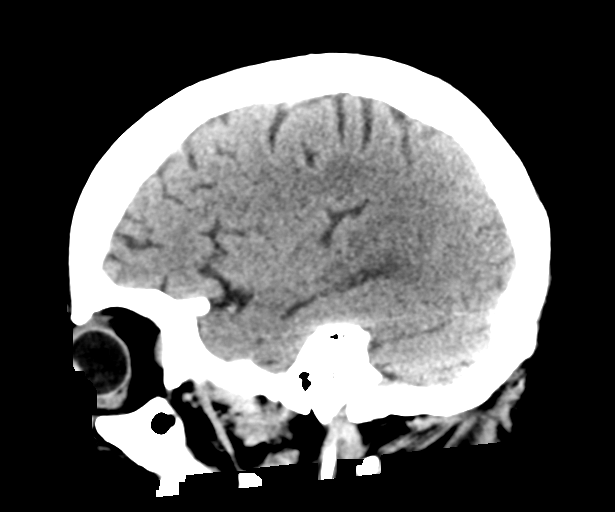
[im 29/57  brain]
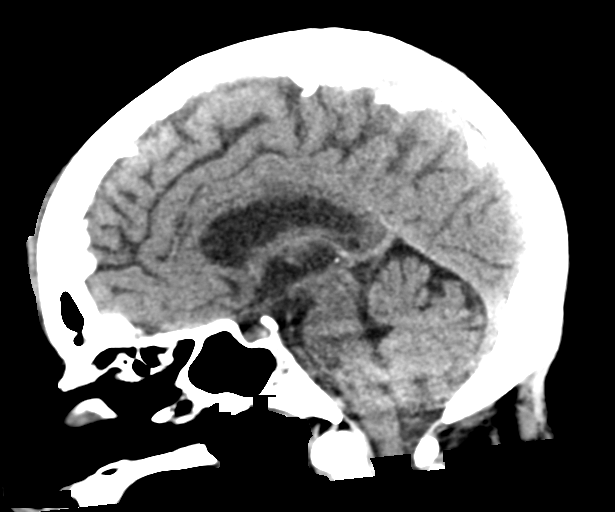
[im 38/57  brain]
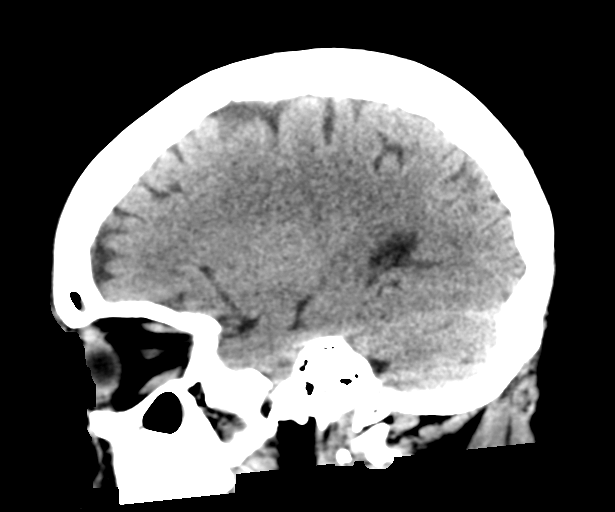

[16 of 47 positions shown; findings below may reference images not displayed]

FINDINGS: CT HEAD FINDINGS

Brain: Mild age-related atrophy and chronic microvascular ischemic
changes. There is no acute intracranial hemorrhage. No mass effect
or midline shift. No extra-axial fluid collection.

Vascular: No hyperdense vessel or unexpected calcification.

Skull: Normal. Negative for fracture or focal lesion.

Sinuses/Orbits: There is diffuse mucoperiosteal thickening of
paranasal sinuses. No air-fluid level. The mastoid air cells are
clear.

Other: Scalp contusion over the forehead.

CT CERVICAL SPINE FINDINGS

Alignment: There is grade 1 anterior subluxation of C6 over C7, new
since the CT of [DATE]. There is widening of the C6-C7
interspinous distance. Interval partial healing of the right C7
superior articular process. There is apparent impaction of the left
C6-C7 articular processes. Findings consistent with ligamentous
injury. Further evaluation with MRI is recommended.

Skull base and vertebrae: No definite acute fracture.

Soft tissues and spinal canal: Narrowing of the central canal and
C6-C7.

Disc levels: Multilevel degenerative changes. Approximately 6 mm
anterolisthesis of C6 over C7.

Upper chest: Negative.

Other: None
IMPRESSION: 1. No acute intracranial pathology.
2. Mild age-related atrophy and chronic microvascular ischemic
changes.
3. Interval grade 1 anterior subluxation of C6 over C7 with widening
of the C6-C7 interspinous distance and impaction of the left C6-C7
articular processes. Findings consistent with ligamentous injury.
Further evaluation with MRI is recommended.

These results were called by telephone at the time of interpretation
on [DATE] at [DATE] to provider BHIMASEN , who verbally
acknowledged these results.

## 2021-02-13 IMAGING — CT CT CERVICAL SPINE W/O CM
3 series · 11 of 35 positions shown, 13 images · non-contrast
Comparison: Head CT dated [DATE].

CLINICAL DATA: Trauma.

EXAM:
CT HEAD WITHOUT CONTRAST
CT CERVICAL SPINE WITHOUT CONTRAST
TECHNIQUE: Multidetector CT imaging of the head and cervical spine was
performed following the standard protocol without intravenous
contrast. Multiplanar CT image reconstructions of the cervical spine
were also generated.

[Series 4: c_spine 2.0 st · axial · 0.34mm/px · z∈[+160,+266]mm · 3 of 87 slices shown, 4 images]
[im 20/87  soft-tissue]
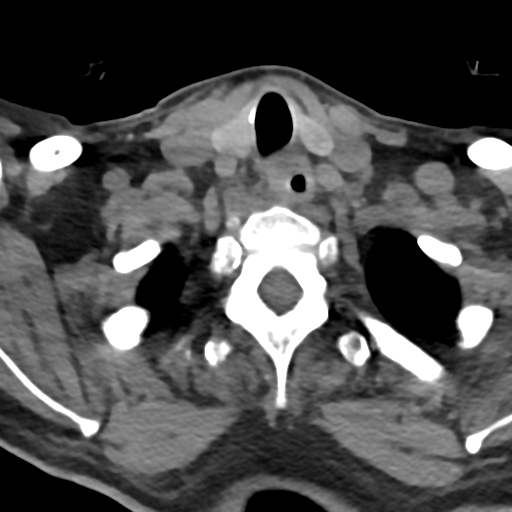
[im 20/87  bone]
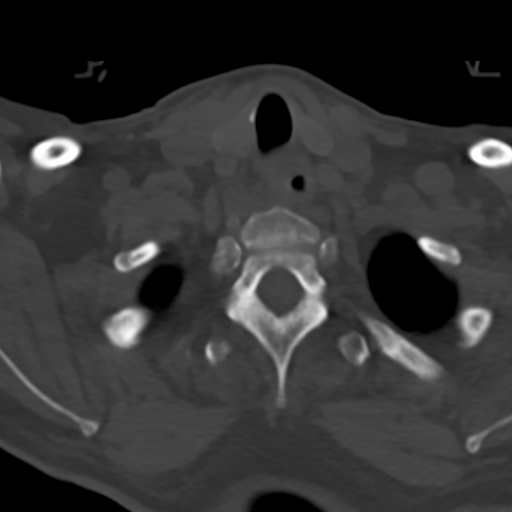
[im 47/87  bone]
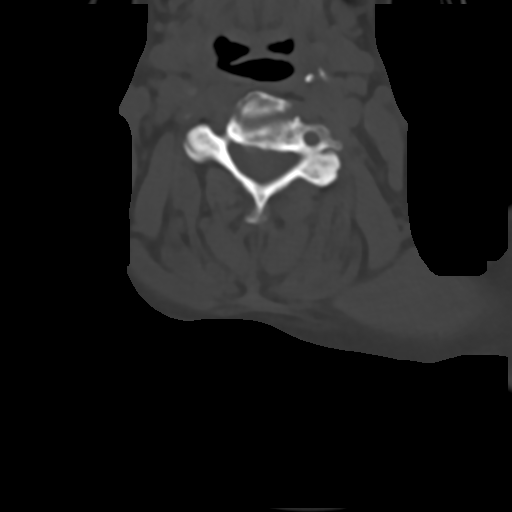
[im 73/87  bone]
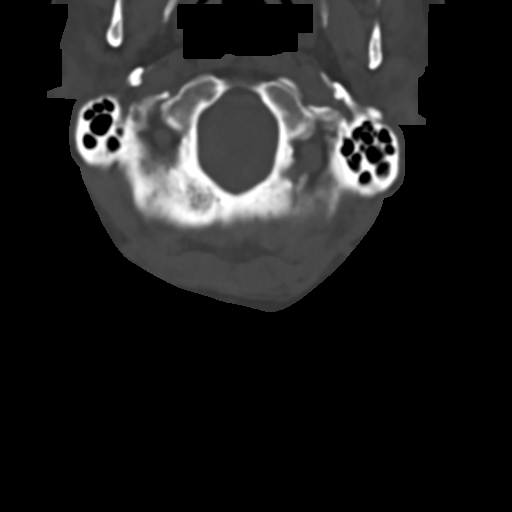

[Series 8: c_spine 2.0 sag bone · sagittal · 0.27mm/px · 5 of 61 slices shown, 6 images]
[im 21/61  bone]
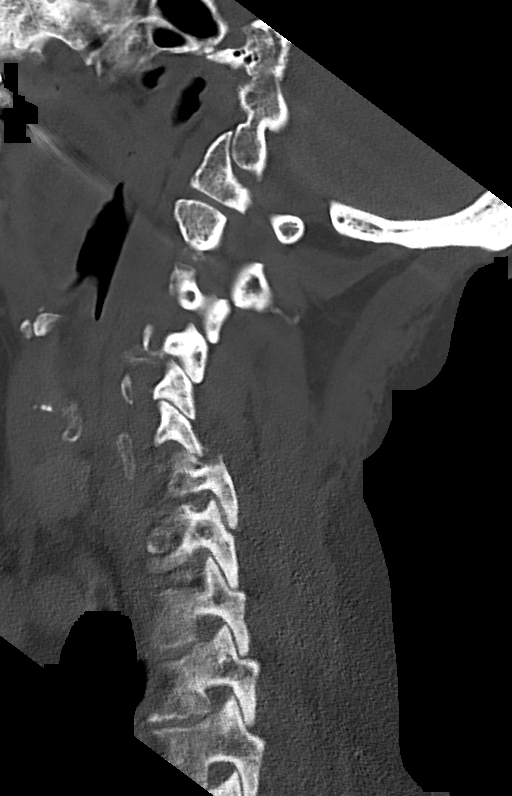
[im 26/61  bone]
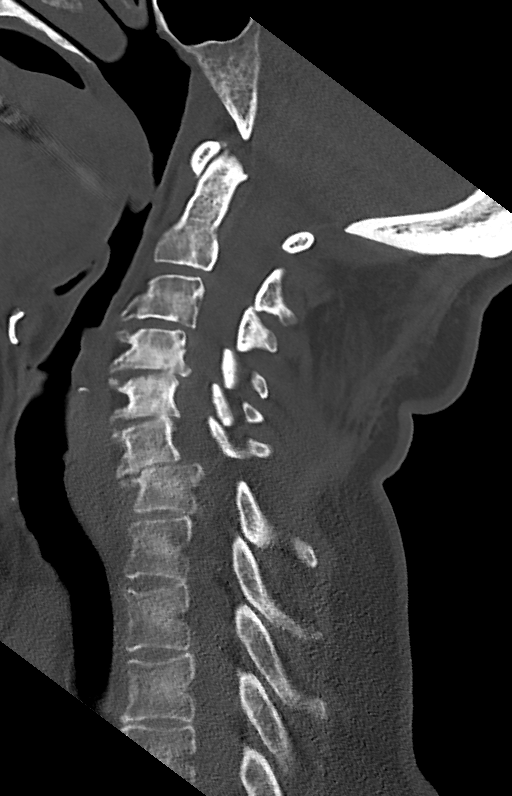
[im 31/61  soft-tissue]
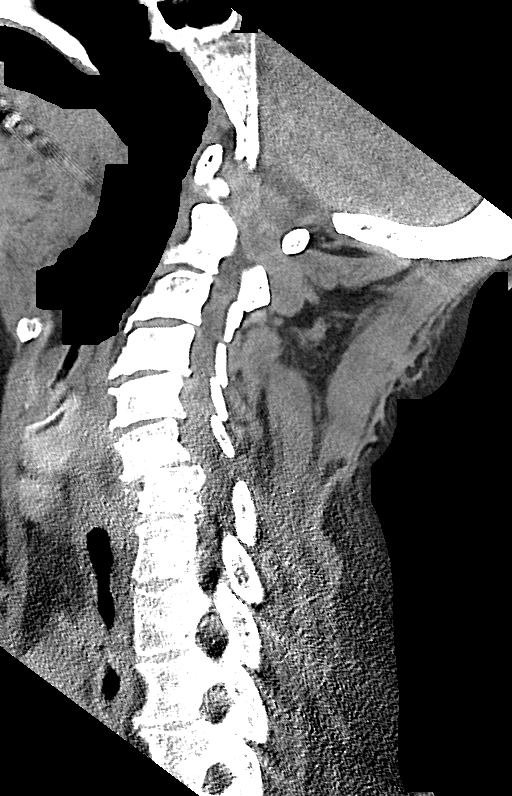
[im 31/61  bone]
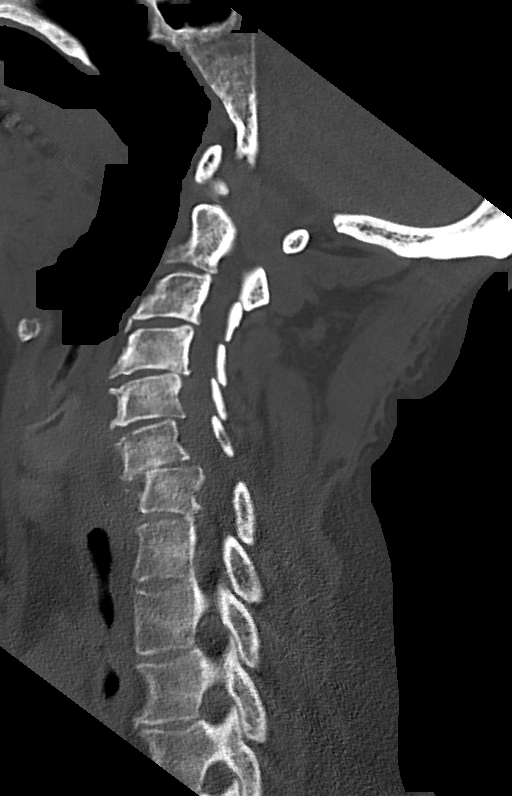
[im 36/61  bone]
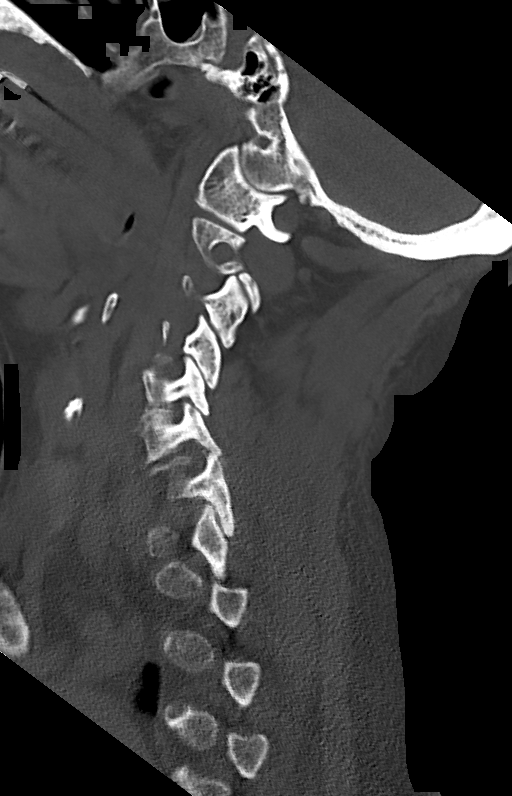
[im 41/61  bone]
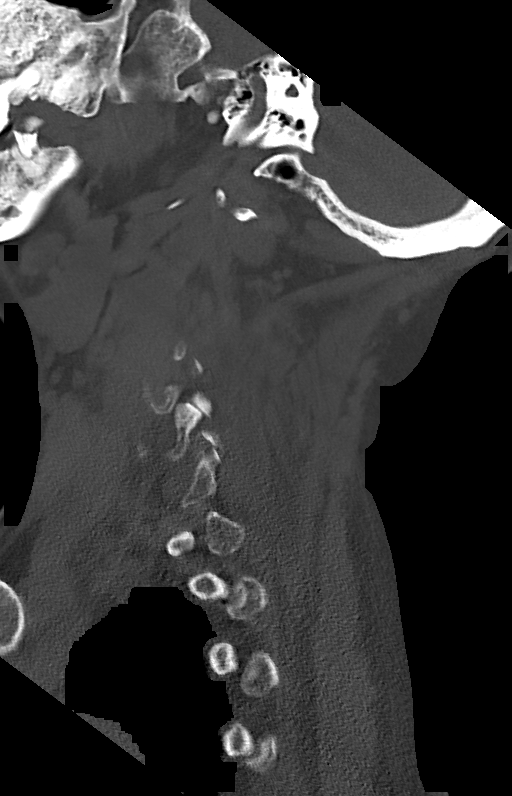

[Series 9: c_spine 2.0 cor bone · coronal · 0.25mm/px · 3 of 61 slices shown]
[im 18/61  bone]
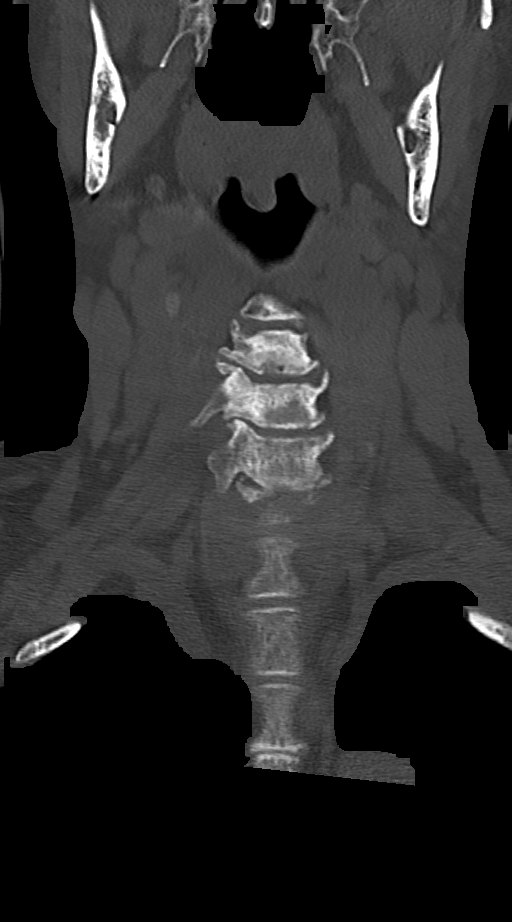
[im 26/61  bone]
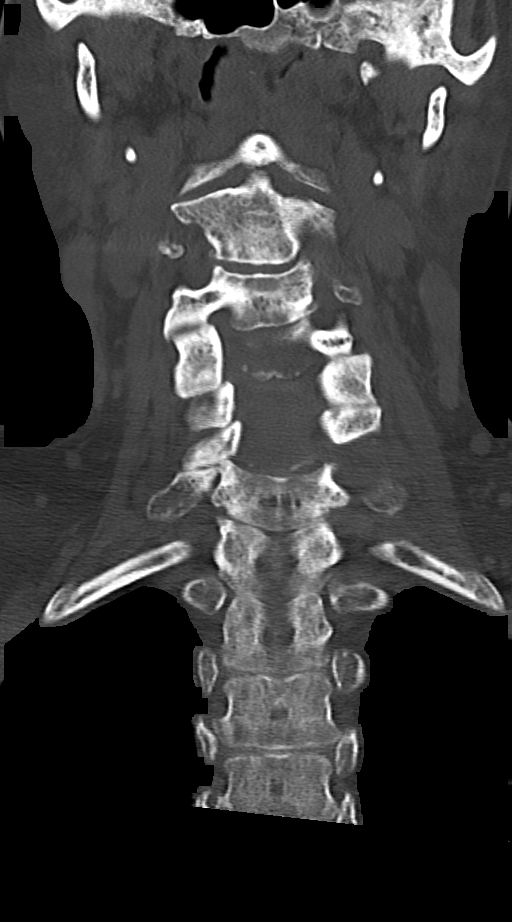
[im 35/61  bone]
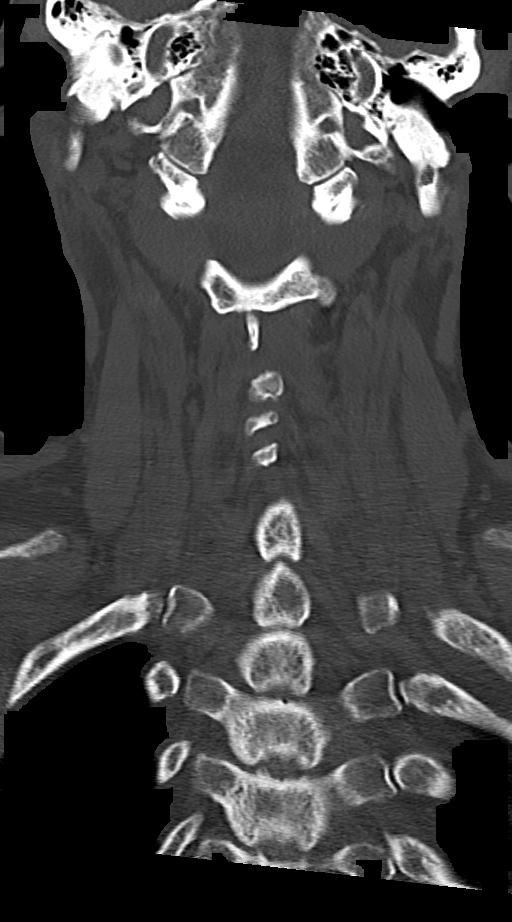

[11 of 35 positions shown; findings below may reference images not displayed]

FINDINGS: CT HEAD FINDINGS

Brain: Mild age-related atrophy and chronic microvascular ischemic
changes. There is no acute intracranial hemorrhage. No mass effect
or midline shift. No extra-axial fluid collection.

Vascular: No hyperdense vessel or unexpected calcification.

Skull: Normal. Negative for fracture or focal lesion.

Sinuses/Orbits: There is diffuse mucoperiosteal thickening of
paranasal sinuses. No air-fluid level. The mastoid air cells are
clear.

Other: Scalp contusion over the forehead.

CT CERVICAL SPINE FINDINGS

Alignment: There is grade 1 anterior subluxation of C6 over C7, new
since the CT of [DATE]. There is widening of the C6-C7
interspinous distance. Interval partial healing of the right C7
superior articular process. There is apparent impaction of the left
C6-C7 articular processes. Findings consistent with ligamentous
injury. Further evaluation with MRI is recommended.

Skull base and vertebrae: No definite acute fracture.

Soft tissues and spinal canal: Narrowing of the central canal and
C6-C7.

Disc levels: Multilevel degenerative changes. Approximately 6 mm
anterolisthesis of C6 over C7.

Upper chest: Negative.

Other: None
IMPRESSION: 1. No acute intracranial pathology.
2. Mild age-related atrophy and chronic microvascular ischemic
changes.
3. Interval grade 1 anterior subluxation of C6 over C7 with widening
of the C6-C7 interspinous distance and impaction of the left C6-C7
articular processes. Findings consistent with ligamentous injury.
Further evaluation with MRI is recommended.

These results were called by telephone at the time of interpretation
on [DATE] at [DATE] to provider BHIMASEN , who verbally
acknowledged these results.

## 2021-02-13 MED ORDER — HEPARIN (PORCINE) 25000 UT/250ML-% IV SOLN
1200.0000 [IU]/h | INTRAVENOUS | Status: AC
  Administered 2021-02-13 – 2021-02-14 (×2): 1300 [IU]/h via INTRAVENOUS
  Administered 2021-02-15: 07:00:00 1200 [IU]/h via INTRAVENOUS
  Filled 2021-02-13 (×4): qty 250

## 2021-02-13 MED ORDER — FLEET ENEMA 7-19 GM/118ML RE ENEM: 1.0000 | ENEMA | Freq: Once | RECTAL | Status: DC | PRN

## 2021-02-13 MED ORDER — BISACODYL 10 MG RE SUPP: 10.0000 mg | Freq: Every day | RECTAL | Status: DC | PRN

## 2021-02-13 MED ORDER — ONDANSETRON HCL 4 MG PO TABS: 4.0000 mg | ORAL_TABLET | Freq: Four times a day (QID) | ORAL | Status: DC | PRN

## 2021-02-13 MED ORDER — SODIUM CHLORIDE 0.9% FLUSH
3.0000 mL | Freq: Two times a day (BID) | INTRAVENOUS | Status: DC
  Administered 2021-02-13 – 2021-02-17 (×9): 3 mL via INTRAVENOUS

## 2021-02-13 MED ORDER — METOPROLOL TARTRATE 5 MG/5ML IV SOLN: 5.0000 mg | Freq: Four times a day (QID) | INTRAVENOUS | Status: DC | PRN

## 2021-02-13 MED ORDER — ONDANSETRON HCL 4 MG/2ML IJ SOLN: 4.0000 mg | Freq: Four times a day (QID) | INTRAMUSCULAR | Status: DC | PRN

## 2021-02-13 MED ORDER — FLUTICASONE PROPIONATE 50 MCG/ACT NA SUSP
2.0000 | Freq: Every day | NASAL | Status: DC
  Administered 2021-02-13 – 2021-02-19 (×7): 2 via NASAL
  Filled 2021-02-13: qty 16

## 2021-02-13 MED ORDER — SODIUM CHLORIDE 0.9 % IV SOLN: 250.0000 mL | INTRAVENOUS | Status: DC | PRN

## 2021-02-13 MED ORDER — HYDROMORPHONE HCL 1 MG/ML IJ SOLN: 0.5000 mg | INTRAMUSCULAR | Status: DC | PRN

## 2021-02-13 MED ORDER — SODIUM CHLORIDE 0.9% FLUSH
3.0000 mL | INTRAVENOUS | Status: DC | PRN
  Administered 2021-02-17: 3 mL via INTRAVENOUS

## 2021-02-13 MED ORDER — ATORVASTATIN CALCIUM 40 MG PO TABS
40.0000 mg | ORAL_TABLET | Freq: Every day | ORAL | Status: DC
  Administered 2021-02-13 – 2021-02-19 (×7): 40 mg via ORAL
  Filled 2021-02-13 (×7): qty 1

## 2021-02-13 MED ORDER — DOCUSATE SODIUM 100 MG PO CAPS
100.0000 mg | ORAL_CAPSULE | Freq: Two times a day (BID) | ORAL | Status: DC
  Administered 2021-02-13 – 2021-02-19 (×11): 100 mg via ORAL
  Filled 2021-02-13 (×11): qty 1

## 2021-02-13 MED ORDER — METHOCARBAMOL 1000 MG/10ML IJ SOLN
500.0000 mg | Freq: Four times a day (QID) | INTRAVENOUS | Status: DC | PRN
  Filled 2021-02-13: qty 5

## 2021-02-13 MED ORDER — ACETAMINOPHEN 650 MG RE SUPP: 650.0000 mg | Freq: Four times a day (QID) | RECTAL | Status: DC | PRN

## 2021-02-13 MED ORDER — POLYETHYLENE GLYCOL 3350 17 G PO PACK: 17.0000 g | PACK | Freq: Every day | ORAL | Status: DC | PRN

## 2021-02-13 MED ORDER — HYDROCODONE-ACETAMINOPHEN 5-325 MG PO TABS: 1.0000 | ORAL_TABLET | ORAL | Status: DC | PRN

## 2021-02-13 MED ORDER — ACETAMINOPHEN 325 MG PO TABS: 650.0000 mg | ORAL_TABLET | Freq: Four times a day (QID) | ORAL | Status: DC | PRN

## 2021-02-13 MED ORDER — AMLODIPINE BESYLATE 5 MG PO TABS
5.0000 mg | ORAL_TABLET | Freq: Every day | ORAL | Status: DC
  Administered 2021-02-13 – 2021-02-19 (×7): 5 mg via ORAL
  Filled 2021-02-13 (×7): qty 1

## 2021-02-13 MED ORDER — METHOCARBAMOL 500 MG PO TABS: 1000.0000 mg | ORAL_TABLET | Freq: Three times a day (TID) | ORAL | Status: DC | PRN
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
## 2021-02-13 NOTE — Progress Notes (Addendum)
ANTICOAGULATION CONSULT NOTE - Follow Up Consult  Pharmacy Consult for IV Heparin Indication:  Right Atrial Thrombus  No Known Allergies  Patient Measurements: Height: 5\' 7"  (170.2 cm) Weight: 79.8 kg (176 lb) IBW/kg (Calculated) : 61.6 Heparin Dosing Weight: 77.8 kg  Vital Signs: Temp: 97.9 F (36.6 C) (01/09 0609) Temp Source: Oral (01/09 0609) BP: 118/87 (01/09 1130) Pulse Rate: 105 (01/09 1130)  Labs: Recent Labs    02/13/21 0230  HGB 14.0  HCT 41.7  PLT 304  LABPROT 13.3  INR 1.0  CREATININE 0.64    Estimated Creatinine Clearance: 83.4 mL/min (by C-G formula based on SCr of 0.64 mg/dL).   Medications:  Scheduled:   amLODipine  5 mg Oral Daily   atorvastatin  40 mg Oral Daily   docusate sodium  100 mg Oral BID   fluticasone  2 spray Each Nare Daily   sodium chloride flush  3 mL Intravenous Q12H   Infusions:   sodium chloride     heparin     methocarbamol (ROBAXIN) IV      Assessment: 61 years of age female admitted post fall with subluxation at C6-7 with plan for surgery. Patient has a history of right atrial thrombus on Eliquis prior to admission with plan for  Pharmacy consulted to change to IV Heparin.   Last dose of Eliquis taken on 02/13/20 at 2200 PM.  H/H within normal limits. Platelets within normal limits. INR 1.  SCr 0.64 - stable.   Goal of Therapy:  Heparin level 0.3-0.7 units/ml Monitor platelets by anticoagulation protocol: Yes   Plan:  As >12 hours since last dose of Eliquis, start IV Heparin at 1300 units/hr.  Aptt and Heparin level in 6 hours.  Daily Heparin level and CBC   Sloan Leiter, PharmD, BCPS, BCCCP Clinical Pharmacist Please refer to [facility] for [place] numbers 02/13/2021,1:26 PM

## 2021-02-13 NOTE — ED Triage Notes (Signed)
Pt brought in by EMS for a fall at home. Pt states she fell trying to lock her door. Pt reports LOC and she is on eliquis. Pt has a laceration on her forehead.

## 2021-02-13 NOTE — ED Notes (Signed)
Pt denies numbness and tingling. Pt has 2+ radial pulses bilat, cap refill loess than 3 sec, 5/5 grip strength bilat. Pt has 2+ pedal pulses bilat, cap refill less than 3 sec, pt able to move feet.
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
## 2021-02-13 NOTE — ED Notes (Signed)
Pt has [place] J collar on upon arrival to the room for me to meet pt.

## 2021-02-13 NOTE — Consult Note (Signed)
CARDIOLOGY CONSULT NOTE       Patient ID: Karen Robertson MRN: [id number] DOB/AGE: Aug 07, 1960 61 y.o.  Admit date: 02/13/2021 Referring Physician: Annette Stable Primary Physician: Eulis Foster, MD Primary Cardiologist: Gardiner Rhyme Reason for Consultation: Anticoagulation management  Active Problems:   * No active hospital problems. *   HPI:  61 y.o. 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 No history of PAF EF is normal and no significant valve disease. From chart I cannot see that she has had a subclavian or IJ line before. She indicates taking her eliquis. She has not had any clinical PE or right sided embolus There is no PFO or risk of left sided embolus. With recent fall she has 3-column injury associated with C6-7 facet fractures and subluxation. Indicates having surgery with Dr Annette Stable Friday  ROS All other systems reviewed and negative except as noted above  Past Medical History:  Diagnosis Date   Health care maintenance 05/25/2020   Hypertension    Sleep apnea    maybe???   Venous insufficiency     Family History  Problem Relation Age of Onset   Diabetes Mother    Hypertension Mother    Colon cancer Neg Hx    Esophageal cancer Neg Hx    Rectal cancer Neg Hx    Stomach cancer Neg Hx     Social History   Socioeconomic History   Marital status: Single    Spouse name: Not on file   Number of children: Not on file   Years of education: Not on file   Highest education level: Not on file  Occupational History   Not on file  Tobacco Use   Smoking status: Never   Smokeless tobacco: Never  Vaping Use   Vaping Use: Never used  Substance and Sexual Activity   Alcohol use: Yes    Comment: Beer   Drug use: No   Sexual activity: Not Currently  Other Topics Concern   Not on file  Social History Narrative    Lives with 3 daughters in [place] apt.;    No tobacco or illicit drug history;   Drinks EtOH infrequently;    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   Sporadic exercise (walking)         Social Determinants of Health   Financial Resource Strain: Low Risk    Difficulty of Paying Living Expenses: Not very hard  Food Insecurity: No Food Insecurity   Worried About Charity fundraiser in the Last Year: Never true   Arboriculturist in the Last Year: Never true  Transportation Needs: No Transportation Needs   Lack of Transportation (Medical): No   Lack of Transportation (Non-Medical): No  Physical Activity: Not on file  Stress: Not on file  Social Connections: Not on file  Intimate Partner Violence: Not on file    Past Surgical History:  Procedure Laterality Date   tubaligation        Current Facility-Administered Medications:    0.9 %  sodium chloride infusion, 250 mL, Intravenous, PRN, Bergman, Meghan D, NP   acetaminophen (TYLENOL) tablet 650 mg, 650 mg, Oral, Q6H PRN **OR** acetaminophen (TYLENOL) suppository 650 mg, 650 mg, Rectal, Q6H PRN, Bergman, Meghan D, NP   amLODipine (NORVASC) tablet 5 mg, 5 mg, Oral, Daily, Bergman, Meghan D, NP,  5 mg at 02/13/21 1018   atorvastatin (LIPITOR) tablet 40 mg, 40 mg, Oral, Daily, Bergman, Meghan D, NP, 40 mg at 02/13/21 1018   bisacodyl (DULCOLAX) suppository 10 mg, 10 mg, Rectal, Daily PRN, Reinaldo Meeker, Meghan D, NP   docusate sodium (COLACE) capsule 100 mg, 100 mg, Oral, BID, Bergman, Meghan D, NP, 100 mg at 02/13/21 1019   fluticasone (FLONASE) 50 MCG/ACT nasal spray 2 spray, 2 spray, Each Nare, Daily, Bergman, Meghan D, NP   HYDROcodone-acetaminophen (NORCO/VICODIN) 5-325 MG per tablet 1-2 tablet, 1-2 tablet, Oral, Q4H PRN, Bergman, Meghan D, NP   HYDROmorphone (DILAUDID) injection 0.5-1 mg, 0.5-1 mg, Intravenous, Q2H PRN, Bergman, Meghan D, NP   methocarbamol (ROBAXIN) 500 mg in dextrose 5 % 50 mL IVPB, 500 mg,  Intravenous, Q6H PRN, Bergman, Meghan D, NP   methocarbamol (ROBAXIN) tablet 1,000 mg, 1,000 mg, Oral, Q8H PRN, Bergman, Meghan D, NP   metoprolol tartrate (LOPRESSOR) injection 5 mg, 5 mg, Intravenous, Q6H PRN, Bergman, Meghan D, NP   ondansetron (ZOFRAN) tablet 4 mg, 4 mg, Oral, Q6H PRN **OR** ondansetron (ZOFRAN) injection 4 mg, 4 mg, Intravenous, Q6H PRN, Bergman, Meghan D, NP   polyethylene glycol (MIRALAX / GLYCOLAX) packet 17 g, 17 g, Oral, Daily PRN, Bergman, Meghan D, NP   sodium chloride flush (NS) 0.9 % injection 3 mL, 3 mL, Intravenous, Q12H, Bergman, Meghan D, NP, 3 mL at 02/13/21 1015   sodium chloride flush (NS) 0.9 % injection 3 mL, 3 mL, Intravenous, PRN, Bergman, Meghan D, NP   sodium phosphate (FLEET) 7-19 GM/118ML enema 1 enema, 1 enema, Rectal, Once PRN, Bergman, Meghan D, NP  Current Outpatient Medications:    acetaminophen (TYLENOL) 500 MG tablet, Take 1 tablet (500 mg total) by mouth every 6 (six) hours as needed. (Patient taking differently: Take 500 mg by mouth every 6 (six) hours as needed for mild pain or headache.), Disp: 30 tablet, Rfl: 0   amLODipine (NORVASC) 5 MG tablet, Take 1 tablet (5 mg total) by mouth daily., Disp: 90 tablet, Rfl: 2   ELIQUIS 5 MG TABS tablet, TAKE 1 TABLET BY MOUTH TWICE A DAY (Patient taking differently: Take 5 mg by mouth 2 (two) times daily.), Disp: 60 tablet, Rfl: 1   fluticasone (FLONASE) 50 MCG/ACT nasal spray, SPRAY 2 SPRAYS INTO EACH NOSTRIL EVERY DAY (Patient taking differently: Place 2 sprays into both nostrils daily.), Disp: 16 mL, Rfl: 2   methocarbamol (ROBAXIN) 500 MG tablet, Take 2 tablets (1,000 mg total) by mouth every 8 (eight) hours as needed for muscle spasms., Disp: 30 tablet, Rfl: 0   polyethylene glycol powder (GLYCOLAX/MIRALAX) 17 GM/SCOOP powder, Take 17 g by mouth daily. (Patient taking differently: Take 17 g by mouth daily as needed for mild constipation.), Disp: 500 g, Rfl: 0   senna (SENOKOT) 8.6 MG TABS tablet, Take  1 tablet (8.6 mg total) by mouth daily. (Patient taking differently: Take 1 tablet by mouth daily as needed for mild constipation.), Disp: 120 tablet, Rfl: 0   atorvastatin (LIPITOR) 40 MG tablet, Take 1 tablet (40 mg total) by mouth daily. Start with 1/2 tablet for 1 week. (Patient not taking: Reported on 02/13/2021), Disp: 30 tablet, Rfl: 1   docusate sodium (COLACE) 100 MG capsule, Take 1 capsule (100 mg total) by mouth 2 (two) times daily. (Patient not taking: Reported on 02/13/2021), Disp: 10 capsule, Rfl: 0   triamcinolone cream (KENALOG) 0.1 %, Apply 1 application topically 2 (two) times daily. Apply to left leg twice a  day for itching due to venous stasis (Patient not taking: Reported on 02/13/2021), Disp: 80 g, Rfl: 0  amLODipine  5 mg Oral Daily   atorvastatin  40 mg Oral Daily   docusate sodium  100 mg Oral BID   fluticasone  2 spray Each Nare Daily   sodium chloride flush  3 mL Intravenous Q12H    sodium chloride     methocarbamol (ROBAXIN) IV      Physical Exam: Blood pressure 118/87, pulse (!) 105, temperature 97.9 F (36.6 C), temperature source Oral, resp. rate 17, height 5\' 7"  (1.702 m), weight 79.8 kg, last menstrual period 08/08/2014, SpO2 100 %.    Thin black female Contusion forehead Cervical collar on  No murmur  Lungs clear  No edema PT palpable bilaterally   Labs:   Lab Results  Component Value Date   WBC 8.0 02/13/2021   HGB 14.0 02/13/2021   HCT 41.7 02/13/2021   MCV 88.9 02/13/2021   PLT 304 02/13/2021    Recent Labs  Lab 02/13/21 0230  NA 135  K 3.1*  CL 102  CO2 17*  BUN 9  CREATININE 0.64  CALCIUM 9.4  PROT 7.3  BILITOT 0.9  ALKPHOS 66  ALT 13  AST 25  GLUCOSE 98   No results found for: CKTOTAL, CKMB, CKMBINDEX, TROPONINI  Lab Results  Component Value Date   CHOL 131 02/02/2021   CHOL 201 (H) 05/25/2020   CHOL 217 (H) 08/20/2012   Lab Results  Component Value Date   HDL 52 02/02/2021   HDL 45 05/25/2020   HDL 58 08/20/2012    Lab Results  Component Value Date   LDLCALC 56 02/02/2021   [place] Comment (A) 05/25/2020   [place]  08/20/2012     Comment:       Not calculated due to Triglyceride >400. Suggest ordering Direct LDL (Unit Code: [phone]).   Total Cholesterol/HDL Ratio:CHD Risk                        Coronary Heart Disease Risk Table                                        Men       Women          1/2 Average Risk              3.4        3.3              Average Risk              5.0        4.4           2X Average Risk              9.6        7.1           3X Average Risk             23.4       11.0 Use the calculated Patient Ratio above and the CHD Risk table  to determine the patient's CHD Risk. ATP III Classification (LDL):       < 100        mg/dL         Optimal      100 - 129  mg/dL         Near or Above Optimal      130 - 159     mg/dL         Borderline High      160 - 189     mg/dL         High       > 190        mg/dL         Very High     Lab Results  Component Value Date   TRIG 133 02/02/2021   TRIG 1,010 (HH) 05/25/2020   TRIG 813 (H) 08/20/2012   Lab Results  Component Value Date   CHOLHDL 2.5 02/02/2021   CHOLHDL 4.5 (H) 05/25/2020   CHOLHDL 3.7 08/20/2012   No results found for: LDLDIRECT    Radiology: CT HEAD WO CONTRAST (5MM)  Result Date: 02/13/2021 CLINICAL DATA:  Trauma. EXAM: CT HEAD WITHOUT CONTRAST CT CERVICAL SPINE WITHOUT CONTRAST TECHNIQUE: Multidetector CT imaging of the head and cervical spine was performed following the standard protocol without intravenous contrast. Multiplanar CT image reconstructions of the cervical spine were also generated. COMPARISON:  Head CT dated 11/05/2020. FINDINGS: CT HEAD FINDINGS Brain: Mild age-related atrophy and chronic microvascular ischemic changes. There is no acute intracranial hemorrhage. No mass effect or midline shift. No extra-axial fluid collection. Vascular: No hyperdense vessel or unexpected calcification.  Skull: Normal. Negative for fracture or focal lesion. Sinuses/Orbits: There is diffuse mucoperiosteal thickening of paranasal sinuses. No air-fluid level. The mastoid air cells are clear. Other: Scalp contusion over the forehead. CT CERVICAL SPINE FINDINGS Alignment: There is grade 1 anterior subluxation of C6 over C7, new since the CT of 11/05/2020. There is widening of the C6-C7 interspinous distance. Interval partial healing of the right C7 superior articular process. There is apparent impaction of the left C6-C7 articular processes. Findings consistent with ligamentous injury. Further evaluation with MRI is recommended. Skull base and vertebrae: No definite acute fracture. Soft tissues and spinal canal: Narrowing of the central canal and C6-C7. Disc levels: Multilevel degenerative changes. Approximately 6 mm anterolisthesis of C6 over C7. Upper chest: Negative. Other: None IMPRESSION: 1. No acute intracranial pathology. 2. Mild age-related atrophy and chronic microvascular ischemic changes. 3. Interval grade 1 anterior subluxation of C6 over C7 with widening of the C6-C7 interspinous distance and impaction of the left C6-C7 articular processes. Findings consistent with ligamentous injury. Further evaluation with MRI is recommended. These results were called by telephone at the time of interpretation on 02/13/2021 at 3:33 am to provider [employer] , who verbally acknowledged these results. Electronically Signed   By: Anner Crete M.D.   On: 02/13/2021 03:51   CT Cervical Spine Wo Contrast  Result Date: 02/13/2021 CLINICAL DATA:  Trauma. EXAM: CT HEAD WITHOUT CONTRAST CT CERVICAL SPINE WITHOUT CONTRAST TECHNIQUE: Multidetector CT imaging of the head and cervical spine was performed following the standard protocol without intravenous contrast. Multiplanar CT image reconstructions of the cervical spine were also generated. COMPARISON:  Head CT dated 11/05/2020. FINDINGS: CT HEAD FINDINGS Brain: Mild  age-related atrophy and chronic microvascular ischemic changes. There is no acute intracranial hemorrhage. No mass effect or midline shift. No extra-axial fluid collection. Vascular: No hyperdense vessel or unexpected calcification. Skull: Normal. Negative for fracture or focal lesion. Sinuses/Orbits: There is diffuse mucoperiosteal thickening of paranasal sinuses. No air-fluid level. The mastoid air cells are clear. Other: Scalp contusion over the forehead. CT CERVICAL SPINE FINDINGS Alignment: There is  grade 1 anterior subluxation of C6 over C7, new since the CT of 11/05/2020. There is widening of the C6-C7 interspinous distance. Interval partial healing of the right C7 superior articular process. There is apparent impaction of the left C6-C7 articular processes. Findings consistent with ligamentous injury. Further evaluation with MRI is recommended. Skull base and vertebrae: No definite acute fracture. Soft tissues and spinal canal: Narrowing of the central canal and C6-C7. Disc levels: Multilevel degenerative changes. Approximately 6 mm anterolisthesis of C6 over C7. Upper chest: Negative. Other: None IMPRESSION: 1. No acute intracranial pathology. 2. Mild age-related atrophy and chronic microvascular ischemic changes. 3. Interval grade 1 anterior subluxation of C6 over C7 with widening of the C6-C7 interspinous distance and impaction of the left C6-C7 articular processes. Findings consistent with ligamentous injury. Further evaluation with MRI is recommended. These results were called by telephone at the time of interpretation on 02/13/2021 at 3:33 am to provider [facility] , who verbally acknowledged these results. Electronically Signed   By: Anner Crete M.D.   On: 02/13/2021 03:51   MR Cervical Spine Wo Contrast  Addendum Date: 02/13/2021   ADDENDUM REPORT: 02/13/2021 07:59 ADDENDUM: Study discussed by telephone with Dr. Baird Kay in the ED on 02/13/2021 at 0754 hours. Electronically Signed   By: Genevie Ann M.D.   On: 02/13/2021 07:59   Result Date: 02/13/2021 CLINICAL DATA:  61 year old female status post fall with head injury. CT reveals C6-C7 fracture dislocation with anterolisthesis, facet disruption. EXAM: MRI CERVICAL SPINE WITHOUT CONTRAST TECHNIQUE: Multiplanar, multisequence MR imaging of the cervical spine was performed. No intravenous contrast was administered. COMPARISON:  CT cervical spine 0308 hours today, and 11/05/2020. FINDINGS: Alignment: Abnormal C6-C7 anterolisthesis is stable from earlier today. Subtle focal lordosis at C6-C7 also new but stable. Relatively preserved cervical lordosis elsewhere. Vertebrae: Bilateral C6-C7 facet injury, with fracture of the right C7 superior articulating facet better demonstrated by CT. No confluent bone marrow edema outside of the right C7 facet. Background degenerative marrow signal changes in much of the cervical spine. There are mild chronic T2 and T3 superior endplate compressions which are stable from last year. Cord: Abnormal spinal cord mass effect, combined degenerative and posttraumatic as detailed below. No definite abnormal cord signal on T2 imaging, although questionable spinal cord heterogeneity on sagittal STIR series 7, image 9. No cord hemorrhage on axial T2*. Posterior Fossa, vertebral arteries, paraspinal tissues: Cervicomedullary junction is within normal limits. Negative visible posterior fossa. Preserved major vascular flow voids in the neck. Negative visible lung apices. Abnormal though mild prevertebral soft tissue T2 and STIR signal from C6-T1 consistent with anterior ligamentous injury. Posterior longitudinal ligamentous injury by virtue of the abnormal vertebral alignment. Facet capsular injury. But only mild abnormal interspinous signal. Mild patchy posterior paraspinal muscle edema such as on the left series 7, image 15. Above C6-C7 no convincing ligamentous injury. Disc levels: C2-C3:  Degenerative changes without significant  stenosis. C3-C4: Disc, endplate degeneration and facet and ligament flavum hypertrophy. Mild spinal stenosis. Mild if any cord mass effect. Mild to moderate bilateral C4 foraminal stenosis. C4-C5: Disc space loss with circumferential disc osteophyte complex. Somewhat bulky broad-based posterior component. Mild to moderate facet and ligament flavum hypertrophy. Mild spinal stenosis and spinal cord mass effect. Moderate to severe bilateral C5 foraminal stenosis. C5-C6: Disc space loss with circumferential disc osteophyte complex. Broad-based posterior component with facet and ligament flavum hypertrophy. Mild spinal stenosis and cord mass effect. Moderate to severe bilateral C6 foraminal stenosis greater on  the left. C6-C7: Disc space loss. Anterolisthesis. Abnormal facets greater on the right. Posttraumatic mild spinal stenosis and spinal cord mass effect. Spinal cord here detailed above. Mild to moderate left but severe right C7 foraminal stenosis. C7-T1: Disc bulging, facet and endplate hypertrophy. No spinal stenosis, but foraminal disc with moderate to severe bilateral C8 foraminal stenosis. No visible upper thoracic spinal stenosis. IMPRESSION: 1. Evidence of 3-column injury associated with the C6-C7 facet fractures and traumatic anterolisthesis. ALL and PLL injury there. Mild interspinous ligament injury suspected. Mild regional muscle edema. Posttraumatic mild spinal stenosis and mild cord compression there, with questionable faint Cord Contusion visible only on STIR. No cord hemorrhage. 2. Superimposed multilevel degenerative cervical spinal stenosis and spinal cord mass effect elsewhere. No other cord signal abnormality. 3. Multilevel moderate to severe degenerative neural foraminal stenosis at the bilateral C5, C6, and C8 nerve levels. Electronically Signed: By: Genevie Ann M.D. On: 02/13/2021 07:47   MR CARDIAC MORPHOLOGY W WO CONTRAST  Result Date: 02/07/2021 CLINICAL DATA:  Evaluate RA thrombus EXAM:  CARDIAC MRI TECHNIQUE: The patient was scanned on a 1.5 Tesla Siemens magnet. A dedicated cardiac coil was used. Functional imaging was done using Fiesta sequences. 2,3, and 4 chamber views were done to assess for RWMA's. Modified Simpson's rule using a short axis stack was used to calculate an ejection fraction on a dedicated work Conservation officer, nature. The patient received 10 cc of Gadavist. After 10 minutes inversion recovery sequences were used to assess for infiltration and scar tissue. CONTRAST:  10 cc  of Gadavist FINDINGS: Left ventricle: -Normal size -Normal systolic function -Normal ECV (28%) -No LGE LV EF:  61% (Normal 56-78%) Absolute volumes: LV EDV: 22mL (Normal 52-141 mL) LV ESV: 36mL (Normal 13-51 mL) LV SV: 27mL (Normal 33-97 mL) CO: 4.6L/min (Normal 2.7-6.0 L/min) Indexed volumes: LV EDV: 5mL/sq-m (Normal 41-81 mL/sq-m) LV ESV: 23mL/sq-m (Normal 12-21 mL/sq-m) LV SV: 57mL/sq-m (Normal 26-56 mL/sq-m) CI: 2.6L/min/sq-m (Normal 1.8-3.8 L/min/sq-m) Right ventricle: Normal size and systolic function RV EF: 91% (Normal 47-80%) Absolute volumes: RV EDV: 20mL (Normal 58-154 mL) RV ESV: 51mL (Normal 12-68 mL) RV SV: 90mL (Normal 35-98 mL) CO: 4.8L/min (Normal 2.7-6 L/min) Indexed volumes: RV EDV: 46mL/sq-m (Normal 48-87 mL/sq-m) RV ESV: 73mL/sq-m (Normal 11-28 mL/sq-m) RV SV: 10mL/sq-m (Normal 27-57 mL/sq-m) CI: 2.8L/min/sq-m (Normal 1.8-3.8 L/min/sq-m) Left atrium: Normal size Right atrium: Normal size. Mass consistent with thrombus measuring 73mm x 23mm at junction of SVC and RA, grossly unchanged from prior MRI Mitral valve: Trivial regurgitation Aortic valve: No regurgitation Tricuspid valve: Trivial regurgitation Pulmonic valve: No regurgitation Aorta: Normal proximal ascending aorta Pericardium: Normal IMPRESSION: 1. Mass consistent with thrombus measuring 13mm x 43mm at junction of SVC and right atrium, grossly unchanged from prior cardiac MRI on 11/08/20 2.  Normal LV size and systolic  function (EF 63%).  No LGE 3.  Normal RV size and systolic function (EF 84%) Electronically Signed   By: Oswaldo Milian M.D.   On: 02/07/2021 22:41    EKG: ST rate 102 LVH   ASSESSMENT AND PLAN:   RA Mass: etiology unclear but stable appearance on cardiac MRI 02/07/21 On eliquis but needing C spine surgery. Not clear if patient staying in hospital till Friday Have reached out to Dr Annette Stable to see if heparin ok with recent trauma DOAC held. If she is to be d/c and readmitted can consider pharmacy arranging lovenox with last dose 24 hours before surgery given neurologic nature of surgery and half life 4-7  hours I think she is at low risk for embolic event off anticoagulation and it would be right sided if any ETOH:  abuse level 270 on admission 30 oz beer daily at least. Contributes to recurrent falls  HTN:  on Norvasc consider changing to lopressor given tachycardia and ETOH abuse  HLD:  on statin   Signed: Jenkins Rouge 02/13/2021, 12:34 PM
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
## 2021-02-13 NOTE — Progress Notes (Signed)
ANTICOAGULATION CONSULT NOTE - Follow Up Consult  Pharmacy Consult for Heparin (Eliquis on hold) Indication:  RA thrombus  No Known Allergies  Patient Measurements: Height: 5\' 7"  (170.2 cm) Weight: 79.8 kg (176 lb) IBW/kg (Calculated) : 61.6 Heparin Dosing Weight: 79 kg  Vital Signs: Temp: 99.3 F (37.4 C) (01/09 2036) Temp Source: Oral (01/09 2036) BP: 126/77 (01/09 2036) Pulse Rate: 95 (01/09 2036)  Labs: Recent Labs    02/13/21 0230 02/13/21 1934  HGB 14.0  --   HCT 41.7  --   PLT 304  --   APTT  --  72*  LABPROT 13.3  --   INR 1.0  --   HEPARINUNFRC  --  1.09*  CREATININE 0.64  --     Estimated Creatinine Clearance: 83.4 mL/min (by C-G formula based on SCr of 0.64 mg/dL).  Assessment: 61 years old female admitted post fall with subluxation at C6-7 with plan for surgery. Patient has a history of right atrial thrombus and on Eliquis prior to admission with plan for cervical spine surgery, tentatively on 02/17/21. Pharmacy consulted to change to IV Heparin.     Will use aPTTs for monitoring while heparin levels are falsely elevated from recent Eliquis doses. Last dose of Eliquis taken on 02/13/20 at 2200 PM.     Initial aPTT is therapeutic (72 seconds) on 1300 units/hr.  Heparin level falsely elevated 1.09.   Goal of Therapy:  Heparin level 0.3-0.7 units/ml aPTT 66-102 seconds Monitor platelets by anticoagulation protocol: Yes   Plan:  Continue heparin drip at 1300 units/hr. Daily aPTT and heparin level until correlating; daily CBC. Eliquis on hold.  Arty Baumgartner, [place] 02/13/2021,8:54 PM

## 2021-02-13 NOTE — ED Notes (Signed)
Pt transported to MRI 

## 2021-02-13 NOTE — ED Notes (Signed)
Pt has about a half inch lac on forehead. Bleeding is controlled.

## 2021-02-14 DIAGNOSIS — I5189 Other ill-defined heart diseases: Secondary | ICD-10-CM

## 2021-02-14 DIAGNOSIS — Z5181 Encounter for therapeutic drug level monitoring: Secondary | ICD-10-CM | POA: Diagnosis not present

## 2021-02-14 DIAGNOSIS — M4802 Spinal stenosis, cervical region: Secondary | ICD-10-CM

## 2021-02-14 DIAGNOSIS — Z7901 Long term (current) use of anticoagulants: Secondary | ICD-10-CM | POA: Diagnosis not present

## 2021-02-14 LAB — CBC
HCT: 36.5 % (ref 36.0–46.0)
Hemoglobin: 12.2 g/dL (ref 12.0–15.0)
MCH: 29.2 pg (ref 26.0–34.0)
MCHC: 33.4 g/dL (ref 30.0–36.0)
MCV: 87.3 fL (ref 80.0–100.0)
Platelets: 227 10*3/uL (ref 150–400)
RBC: 4.18 MIL/uL (ref 3.87–5.11)
RDW: 17.2 % — ABNORMAL HIGH (ref 11.5–15.5)
WBC: 6 10*3/uL (ref 4.0–10.5)
nRBC: 0 % (ref 0.0–0.2)

## 2021-02-14 LAB — APTT
aPTT: 108 seconds — ABNORMAL HIGH (ref 24–36)
aPTT: 101 seconds — ABNORMAL HIGH (ref 24–36)

## 2021-02-14 LAB — HEPARIN LEVEL (UNFRACTIONATED): Heparin Unfractionated: >1.1 IU/mL — ABNORMAL HIGH (ref 0.30–0.70)

## 2021-02-14 MED ORDER — POTASSIUM CHLORIDE CRYS ER 20 MEQ PO TBCR
40.0000 meq | EXTENDED_RELEASE_TABLET | ORAL | Status: AC
  Administered 2021-02-14 (×2): 40 meq via ORAL
  Filled 2021-02-14 (×2): qty 2

## 2021-02-14 NOTE — Progress Notes (Addendum)
°  Transition of Care [facility]) Screening Note   Patient Details  Name: LAWANDA HOLZHEIMER Date of Birth: 1961/01/20   Transition of Care [facility]) CM/SW Contact:    Sharin Mons, RN Phone Number: [phone] 02/14/2021, 4:14 PM    S/p fall,  pt with severe multilevel cervical stenosis with C6-7 fracture subluxation.  NCM spoke with pt regarding d/c planning. Pt states  from home with sister, Charlett Nose. Pt with supportive daughter, [name]. PTA independent with ADL's. Used rolling walker intermittently.  PCP: Eulis Foster  PLAN: C4-5, C5-6 and C6-7 anterior cervical discectomy and interbody fusion on Friday  Transition of Care Department [facility]) has reviewed patient and will continue to monitor patient advancement through interdisciplinary progression rounds.  Whitman Hero RN,BSN,CM

## 2021-02-14 NOTE — Progress Notes (Signed)
Overall stable.  No new issues or problems.  Neck pain and right upper extremity weakness and numbness stable.  Patient with severe multilevel cervical stenosis with C6-7 fracture subluxation further complicating her situation.  Plan is for C4-5, C5-6 and C6-7 anterior cervical discectomy and interbody fusion on Friday.  Patient will remain off her Eliquis.  She will continue to use IV heparin until the night before surgery.  We will resume Eliquis at the earliest possible time usually 4 to 5 days postop.

## 2021-02-14 NOTE — Progress Notes (Signed)
Subjective:  No cardiac complaints   Objective:  Vitals:   02/13/21 1640 02/13/21 1714 02/13/21 2036 02/14/21 0006  BP: 116/77 137/75 126/77 125/76  Pulse: (!) 102 100 95 95  Resp: 20 20    Temp: 97.8 F (36.6 C) 98 F (36.7 C) 99.3 F (37.4 C) 98.7 F (37.1 C)  TempSrc: Oral Oral Oral Oral  SpO2: 100% 98% 98% 98%  Weight:      Height:        Intake/Output from previous day:  Intake/Output Summary (Last 24 hours) at 02/14/2021 0703 Last data filed at 02/14/2021 0547 Gross per 24 hour  Intake 46.14 ml  Output 450 ml  Net -403.86 ml    Physical Exam: Thin black female Cervical collar No murmur  Lungs clear No edema   Lab Results: Basic Metabolic Panel: Recent Labs    02/13/21 0230  NA 135  K 3.1*  CL 102  CO2 17*  GLUCOSE 98  BUN 9  CREATININE 0.64  CALCIUM 9.4   Liver Function Tests: Recent Labs    02/13/21 0230  AST 25  ALT 13  ALKPHOS 66  BILITOT 0.9  PROT 7.3  ALBUMIN 3.9   No results for input(s): LIPASE, AMYLASE in the last 72 hours. CBC: Recent Labs    02/13/21 0230 02/14/21 0224  WBC 8.0 6.0  NEUTROABS 4.3  --   HGB 14.0 12.2  HCT 41.7 36.5  MCV 88.9 87.3  PLT 304 227     Imaging: CT HEAD WO CONTRAST (5MM)  Result Date: 02/13/2021 CLINICAL DATA:  Trauma. EXAM: CT HEAD WITHOUT CONTRAST CT CERVICAL SPINE WITHOUT CONTRAST TECHNIQUE: Multidetector CT imaging of the head and cervical spine was performed following the standard protocol without intravenous contrast. Multiplanar CT image reconstructions of the cervical spine were also generated. COMPARISON:  Head CT dated 11/05/2020. FINDINGS: CT HEAD FINDINGS Brain: Mild age-related atrophy and chronic microvascular ischemic changes. There is no acute intracranial hemorrhage. No mass effect or midline shift. No extra-axial fluid collection. Vascular: No hyperdense vessel or unexpected calcification. Skull: Normal. Negative for fracture or focal lesion. Sinuses/Orbits: There is diffuse  mucoperiosteal thickening of paranasal sinuses. No air-fluid level. The mastoid air cells are clear. Other: Scalp contusion over the forehead. CT CERVICAL SPINE FINDINGS Alignment: There is grade 1 anterior subluxation of C6 over C7, new since the CT of 11/05/2020. There is widening of the C6-C7 interspinous distance. Interval partial healing of the right C7 superior articular process. There is apparent impaction of the left C6-C7 articular processes. Findings consistent with ligamentous injury. Further evaluation with MRI is recommended. Skull base and vertebrae: No definite acute fracture. Soft tissues and spinal canal: Narrowing of the central canal and C6-C7. Disc levels: Multilevel degenerative changes. Approximately 6 mm anterolisthesis of C6 over C7. Upper chest: Negative. Other: None IMPRESSION: 1. No acute intracranial pathology. 2. Mild age-related atrophy and chronic microvascular ischemic changes. 3. Interval grade 1 anterior subluxation of C6 over C7 with widening of the C6-C7 interspinous distance and impaction of the left C6-C7 articular processes. Findings consistent with ligamentous injury. Further evaluation with MRI is recommended. These results were called by telephone at the time of interpretation on 02/13/2021 at 3:33 am to provider [facility] , who verbally acknowledged these results. Electronically Signed   By: Anner Crete M.D.   On: 02/13/2021 03:51   CT Cervical Spine Wo Contrast  Result Date: 02/13/2021 CLINICAL DATA:  Trauma. EXAM: CT HEAD WITHOUT CONTRAST CT CERVICAL SPINE  WITHOUT CONTRAST TECHNIQUE: Multidetector CT imaging of the head and cervical spine was performed following the standard protocol without intravenous contrast. Multiplanar CT image reconstructions of the cervical spine were also generated. COMPARISON:  Head CT dated 11/05/2020. FINDINGS: CT HEAD FINDINGS Brain: Mild age-related atrophy and chronic microvascular ischemic changes. There is no acute intracranial  hemorrhage. No mass effect or midline shift. No extra-axial fluid collection. Vascular: No hyperdense vessel or unexpected calcification. Skull: Normal. Negative for fracture or focal lesion. Sinuses/Orbits: There is diffuse mucoperiosteal thickening of paranasal sinuses. No air-fluid level. The mastoid air cells are clear. Other: Scalp contusion over the forehead. CT CERVICAL SPINE FINDINGS Alignment: There is grade 1 anterior subluxation of C6 over C7, new since the CT of 11/05/2020. There is widening of the C6-C7 interspinous distance. Interval partial healing of the right C7 superior articular process. There is apparent impaction of the left C6-C7 articular processes. Findings consistent with ligamentous injury. Further evaluation with MRI is recommended. Skull base and vertebrae: No definite acute fracture. Soft tissues and spinal canal: Narrowing of the central canal and C6-C7. Disc levels: Multilevel degenerative changes. Approximately 6 mm anterolisthesis of C6 over C7. Upper chest: Negative. Other: None IMPRESSION: 1. No acute intracranial pathology. 2. Mild age-related atrophy and chronic microvascular ischemic changes. 3. Interval grade 1 anterior subluxation of C6 over C7 with widening of the C6-C7 interspinous distance and impaction of the left C6-C7 articular processes. Findings consistent with ligamentous injury. Further evaluation with MRI is recommended. These results were called by telephone at the time of interpretation on 02/13/2021 at 3:33 am to provider [facility] , who verbally acknowledged these results. Electronically Signed   By: Anner Crete M.D.   On: 02/13/2021 03:51   MR Cervical Spine Wo Contrast  Addendum Date: 02/13/2021   ADDENDUM REPORT: 02/13/2021 07:59 ADDENDUM: Study discussed by telephone with Dr. Baird Kay in the ED on 02/13/2021 at 0754 hours. Electronically Signed   By: Genevie Ann M.D.   On: 02/13/2021 07:59   Result Date: 02/13/2021 CLINICAL DATA:  61 year old female  status post fall with head injury. CT reveals C6-C7 fracture dislocation with anterolisthesis, facet disruption. EXAM: MRI CERVICAL SPINE WITHOUT CONTRAST TECHNIQUE: Multiplanar, multisequence MR imaging of the cervical spine was performed. No intravenous contrast was administered. COMPARISON:  CT cervical spine 0308 hours today, and 11/05/2020. FINDINGS: Alignment: Abnormal C6-C7 anterolisthesis is stable from earlier today. Subtle focal lordosis at C6-C7 also new but stable. Relatively preserved cervical lordosis elsewhere. Vertebrae: Bilateral C6-C7 facet injury, with fracture of the right C7 superior articulating facet better demonstrated by CT. No confluent bone marrow edema outside of the right C7 facet. Background degenerative marrow signal changes in much of the cervical spine. There are mild chronic T2 and T3 superior endplate compressions which are stable from last year. Cord: Abnormal spinal cord mass effect, combined degenerative and posttraumatic as detailed below. No definite abnormal cord signal on T2 imaging, although questionable spinal cord heterogeneity on sagittal STIR series 7, image 9. No cord hemorrhage on axial T2*. Posterior Fossa, vertebral arteries, paraspinal tissues: Cervicomedullary junction is within normal limits. Negative visible posterior fossa. Preserved major vascular flow voids in the neck. Negative visible lung apices. Abnormal though mild prevertebral soft tissue T2 and STIR signal from C6-T1 consistent with anterior ligamentous injury. Posterior longitudinal ligamentous injury by virtue of the abnormal vertebral alignment. Facet capsular injury. But only mild abnormal interspinous signal. Mild patchy posterior paraspinal muscle edema such as on the left series 7, image  15. Above C6-C7 no convincing ligamentous injury. Disc levels: C2-C3:  Degenerative changes without significant stenosis. C3-C4: Disc, endplate degeneration and facet and ligament flavum hypertrophy. Mild spinal  stenosis. Mild if any cord mass effect. Mild to moderate bilateral C4 foraminal stenosis. C4-C5: Disc space loss with circumferential disc osteophyte complex. Somewhat bulky broad-based posterior component. Mild to moderate facet and ligament flavum hypertrophy. Mild spinal stenosis and spinal cord mass effect. Moderate to severe bilateral C5 foraminal stenosis. C5-C6: Disc space loss with circumferential disc osteophyte complex. Broad-based posterior component with facet and ligament flavum hypertrophy. Mild spinal stenosis and cord mass effect. Moderate to severe bilateral C6 foraminal stenosis greater on the left. C6-C7: Disc space loss. Anterolisthesis. Abnormal facets greater on the right. Posttraumatic mild spinal stenosis and spinal cord mass effect. Spinal cord here detailed above. Mild to moderate left but severe right C7 foraminal stenosis. C7-T1: Disc bulging, facet and endplate hypertrophy. No spinal stenosis, but foraminal disc with moderate to severe bilateral C8 foraminal stenosis. No visible upper thoracic spinal stenosis. IMPRESSION: 1. Evidence of 3-column injury associated with the C6-C7 facet fractures and traumatic anterolisthesis. ALL and PLL injury there. Mild interspinous ligament injury suspected. Mild regional muscle edema. Posttraumatic mild spinal stenosis and mild cord compression there, with questionable faint Cord Contusion visible only on STIR. No cord hemorrhage. 2. Superimposed multilevel degenerative cervical spinal stenosis and spinal cord mass effect elsewhere. No other cord signal abnormality. 3. Multilevel moderate to severe degenerative neural foraminal stenosis at the bilateral C5, C6, and C8 nerve levels. Electronically Signed: By: Genevie Ann M.D. On: 02/13/2021 07:47    Cardiac Studies:  ECG: ST rate 102 otherwise normal    Echo:  11/06/20  1. Images are limited. Unable to completely visualize right atrium. There  is an echodensity seen intermittently in the parasternal  short axis and  apical four chamber views of uncertain significance - does not necessarily  appear to be in the same  position as filling defect on CT. Futhermore, there is a larger, complex  shaped echodensity seen in the left atrium only in the subcostal views  that could be due to off axis imaging of the atrial [place] but cannot  exclude mass. Would suggest TEE for  further clarification, although a directed cardiac CT could also be  considered if TEE is not feasible.   2. Left ventricular ejection fraction, by estimation, is 55 to 60%. The  left ventricle has normal function. The left ventricle has no regional  Molinari motion abnormalities. Left ventricular diastolic parameters are  indeterminate.   3. Right ventricular systolic function is normal. The right ventricular  size is normal. There is normal pulmonary artery systolic pressure.   4. The pericardial effusion is posterior to the left ventricle.   5. The mitral valve is grossly normal. Trivial mitral valve  regurgitation.   6. The aortic valve is tricuspid. Aortic valve regurgitation is not  visualized.   7. The inferior vena cava is normal in size with greater than 50%  respiratory variability, suggesting right atrial pressure of 3 mmHg.   Medications:    amLODipine  5 mg Oral Daily   atorvastatin  40 mg Oral Daily   docusate sodium  100 mg Oral BID   fluticasone  2 spray Each Nare Daily   sodium chloride flush  3 mL Intravenous Q12H      sodium chloride     heparin 1,300 Units/hr (02/13/21 1426)   methocarbamol (ROBAXIN) IV  Assessment/Plan:   RA Mass:  stable thrombus on cardiac MRI 10/4 and 02/07/21  No evidence of embolus to lungs. Eliquis held for cervical neck surgery ? Friday On heparin  HTN:  Well controlled.  Continue current medications and low sodium Dash type diet.   HLD:  continue statin C-spine : multiple falls from ETOH abuse and c spine subluxation surgery Dr Annette Stable ? Friday    Jenkins Rouge 02/14/2021, 7:03 AM

## 2021-02-14 NOTE — TOC CAGE-AID Note (Signed)
Transition of Care [facility]) - CAGE-AID Screening   Patient Details  Name: Karen Robertson MRN: [id number] Date of Birth: 05/28/1960  Transition of Care [facility]) CM/SW Contact:    Draeden Kellman C Tarpley-Carter, [place] Phone Number: 02/14/2021, 1:17 PM   Clinical Narrative: Pt participated in [place].  Pt stated she does not use substance or ETOH.  Pt drinks ETOH (detected).  Pt was offered resources, due to usage of ETOH.     Sondos Wolfman Tarpley-Carter, MSW, LCSW-A Pronouns:  She/Her/Hers [place] Transitions of Care Clinical Social Worker Direct Number:  [phone] [email or web address]  CAGE-AID Screening:    Have You Ever Felt You Ought to Cut Down on Your Drinking or Drug Use?: No Have People Annoyed You By [employer] Your Drinking Or Drug Use?: No Have You Felt Bad Or Guilty About Your Drinking Or Drug Use?: No Have You Ever Had a Drink or Used Drugs First Thing In The Morning to Steady Your Nerves or to Get Rid of a Hangover?: No CAGE-AID Score: 0  Substance Abuse Education Offered: Yes  Substance abuse interventions: Scientist, clinical (histocompatibility and immunogenetics)

## 2021-02-14 NOTE — Progress Notes (Signed)
ANTICOAGULATION CONSULT NOTE - Follow Up Consult  Pharmacy Consult for Heparin (Eliquis on hold) Indication:  RA thrombus  No Known Allergies  Patient Measurements: Height: 5\' 7"  (170.2 cm) Weight: 79.8 kg (176 lb) IBW/kg (Calculated) : 61.6 Heparin Dosing Weight: 79 kg  Vital Signs: Temp: 98.8 F (37.1 C) (01/10 0743) Temp Source: Oral (01/10 0743) BP: 122/84 (01/10 1128) Pulse Rate: 96 (01/10 1128)  Labs: Recent Labs    02/13/21 0230 02/13/21 1934 02/14/21 0224 02/14/21 1634  HGB 14.0  --  12.2  --   HCT 41.7  --  36.5  --   PLT 304  --  227  --   APTT  --  72* 108* 101*  LABPROT 13.3  --   --   --   INR 1.0  --   --   --   HEPARINUNFRC  --  1.09* >1.10*  --   CREATININE 0.64  --   --   --      Estimated Creatinine Clearance: 83.4 mL/min (by C-G formula based on SCr of 0.64 mg/dL).  Assessment: 61 years old female admitted post fall with subluxation at C6-7 with plan for surgery. Patient has a history of right atrial thrombus and on Eliquis prior to admission with plan for cervical spine surgery, tentatively on 02/17/21. Pharmacy consulted to change to IV Heparin.     Will use aPTTs for monitoring while heparin levels are falsely elevated from recent Eliquis doses. Last dose of Eliquis taken on 02/13/20 at 2200 PM.     Initial aPTT was therapeutic (72 seconds) on 1300 units/hr, but up to 108 seconds this am.  Now above goal.  Heparin level >1.10, falsely elevated.  PTT came back at 101 so it is in goal. We will continue with the same rate and check a level in AM.   Goal of Therapy:  Heparin level 0.3-0.7 units/ml aPTT 66-102 seconds Monitor platelets by anticoagulation protocol: Yes   Plan:  Cont heparin drip 1200 units/hr. Daily aPTT and heparin level until correlating; daily CBC. Eliquis on hold. OR scheduled for 4pm on 02/17/21.  Will follow up timing of holding heparin pre-op.   Onnie Boer, PharmD, BCIDP, AAHIVP, CPP Infectious Disease  Pharmacist 02/14/2021 5:56 PM

## 2021-02-14 NOTE — Progress Notes (Signed)
ANTICOAGULATION CONSULT NOTE - Follow Up Consult  Pharmacy Consult for Heparin (Eliquis on hold) Indication:  RA thrombus  No Known Allergies  Patient Measurements: Height: 5\' 7"  (170.2 cm) Weight: 79.8 kg (176 lb) IBW/kg (Calculated) : 61.6 Heparin Dosing Weight: 79 kg  Vital Signs: Temp: 98.8 F (37.1 C) (01/10 0743) Temp Source: Oral (01/10 0743) BP: 119/81 (01/10 0943) Pulse Rate: 89 (01/10 0743)  Labs: Recent Labs    02/13/21 0230 02/13/21 1934 02/14/21 0224  HGB 14.0  --  12.2  HCT 41.7  --  36.5  PLT 304  --  227  APTT  --  72* 108*  LABPROT 13.3  --   --   INR 1.0  --   --   HEPARINUNFRC  --  1.09* >1.10*  CREATININE 0.64  --   --      Estimated Creatinine Clearance: 83.4 mL/min (by C-G formula based on SCr of 0.64 mg/dL).  Assessment: 61 years old female admitted post fall with subluxation at C6-7 with plan for surgery. Patient has a history of right atrial thrombus and on Eliquis prior to admission with plan for cervical spine surgery, tentatively on 02/17/21. Pharmacy consulted to change to IV Heparin.     Will use aPTTs for monitoring while heparin levels are falsely elevated from recent Eliquis doses. Last dose of Eliquis taken on 02/13/20 at 2200 PM.     Initial aPTT was therapeutic (72 seconds) on 1300 units/hr, but up to 108 seconds this am.  Now above goal.  Heparin level >1.10, falsely elevated.  Goal of Therapy:  Heparin level 0.3-0.7 units/ml aPTT 66-102 seconds Monitor platelets by anticoagulation protocol: Yes   Plan:  Decrease heparin drip to 1200 units/hr. aPTT ~6 hours after rate change. Daily aPTT and heparin level until correlating; daily CBC. Eliquis on hold. OR scheduled for 4pm on 02/17/21.  Will follow up timing of holding heparin pre-op.   Arty Baumgartner, RPh 02/14/2021,10:45 AM

## 2021-02-15 ENCOUNTER — Encounter (HOSPITAL_COMMUNITY): Payer: Self-pay | Admitting: Neurosurgery

## 2021-02-15 DIAGNOSIS — Z7901 Long term (current) use of anticoagulants: Secondary | ICD-10-CM | POA: Diagnosis not present

## 2021-02-15 DIAGNOSIS — Z5181 Encounter for therapeutic drug level monitoring: Secondary | ICD-10-CM | POA: Diagnosis not present

## 2021-02-15 DIAGNOSIS — I5189 Other ill-defined heart diseases: Secondary | ICD-10-CM | POA: Diagnosis not present

## 2021-02-15 LAB — BASIC METABOLIC PANEL
Anion gap: 9 (ref 5–15)
BUN: 7 mg/dL (ref 6–20)
CO2: 21 mmol/L — ABNORMAL LOW (ref 22–32)
Calcium: 9.1 mg/dL (ref 8.9–10.3)
Chloride: 102 mmol/L (ref 98–111)
Creatinine, Ser: 0.63 mg/dL (ref 0.44–1.00)
GFR, Estimated: >60 mL/min (ref >60)
Glucose, Bld: 111 mg/dL — ABNORMAL HIGH (ref 70–99)
Potassium: 4.3 mmol/L (ref 3.5–5.1)
Sodium: 132 mmol/L — ABNORMAL LOW (ref 135–145)

## 2021-02-15 LAB — CBC
HCT: 37.1 % (ref 36.0–46.0)
Hemoglobin: 12.6 g/dL (ref 12.0–15.0)
MCH: 29.7 pg (ref 26.0–34.0)
MCHC: 34 g/dL (ref 30.0–36.0)
MCV: 87.5 fL (ref 80.0–100.0)
Platelets: 196 10*3/uL (ref 150–400)
RBC: 4.24 MIL/uL (ref 3.87–5.11)
RDW: 16.7 % — ABNORMAL HIGH (ref 11.5–15.5)
WBC: 7.7 10*3/uL (ref 4.0–10.5)
nRBC: 0 % (ref 0.0–0.2)

## 2021-02-15 LAB — APTT: aPTT: 84 seconds — ABNORMAL HIGH (ref 24–36)

## 2021-02-15 LAB — HEPARIN LEVEL (UNFRACTIONATED): Heparin Unfractionated: 0.94 IU/mL — ABNORMAL HIGH (ref 0.30–0.70)

## 2021-02-15 NOTE — H&P (View-Only) (Signed)
Overall stable.  Patient's neck pain well controlled.  Still with right upper extremity numbness and weakness.  No obvious symptoms of alcohol withdrawal.  Vital signs are stable.  She is awake and alert.  She is oriented and reasonably appropriate.  Motor and sensory function stable with continued right triceps weakness and right grip and intrinsic weakness.  Chest and abdomen benign.  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  Plan 3 level anterior cervical decompression and fusion on Friday.  Patient to remain on heparin until the morning of surgery secondary to a right atrial hematoma.

## 2021-02-15 NOTE — Progress Notes (Signed)
Subjective:  No cardiac complaints   Objective:  Vitals:   02/14/21 1127 02/14/21 1128 02/14/21 1200 02/14/21 2130  BP: (!) 88/75 122/84  121/88  Pulse: 95 96  100  Resp: 13 14  15   Temp:    98.2 F (36.8 C)  TempSrc:    Oral  SpO2: 100%  99% 99%  Weight:      Height:        Intake/Output from previous day:  Intake/Output Summary (Last 24 hours) at 02/15/2021 0809 Last data filed at 02/14/2021 1130 Gross per 24 hour  Intake --  Output 400 ml  Net -400 ml    Physical Exam: Thin black female Cervical collar No murmur  Lungs clear No edema   Lab Results: Basic Metabolic Panel: Recent Labs    02/13/21 0230 02/15/21 0311  NA 135 132*  K 3.1* 4.3  CL 102 102  CO2 17* 21*  GLUCOSE 98 111*  BUN 9 7  CREATININE 0.64 0.63  CALCIUM 9.4 9.1   Liver Function Tests: Recent Labs    02/13/21 0230  AST 25  ALT 13  ALKPHOS 66  BILITOT 0.9  PROT 7.3  ALBUMIN 3.9   No results for input(s): LIPASE, AMYLASE in the last 72 hours. CBC: Recent Labs    02/13/21 0230 02/14/21 0224 02/15/21 0311  WBC 8.0 6.0 7.7  NEUTROABS 4.3  --   --   HGB 14.0 12.2 12.6  HCT 41.7 36.5 37.1  MCV 88.9 87.3 87.5  PLT 304 227 196     Imaging: No results found.  Cardiac Studies:  ECG: ST rate 102 otherwise normal    Echo:  11/06/20  1. Images are limited. Unable to completely visualize right atrium. There  is an echodensity seen intermittently in the parasternal short axis and  apical four chamber views of uncertain significance - does not necessarily  appear to be in the same  position as filling defect on CT. Futhermore, there is a larger, complex  shaped echodensity seen in the left atrium only in the subcostal views  that could be due to off axis imaging of the atrial [place] but cannot  exclude mass. Would suggest TEE for  further clarification, although a directed cardiac CT could also be  considered if TEE is not feasible.   2. Left ventricular ejection fraction,  by estimation, is 55 to 60%. The  left ventricle has normal function. The left ventricle has no regional  Jorgenson motion abnormalities. Left ventricular diastolic parameters are  indeterminate.   3. Right ventricular systolic function is normal. The right ventricular  size is normal. There is normal pulmonary artery systolic pressure.   4. The pericardial effusion is posterior to the left ventricle.   5. The mitral valve is grossly normal. Trivial mitral valve  regurgitation.   6. The aortic valve is tricuspid. Aortic valve regurgitation is not  visualized.   7. The inferior vena cava is normal in size with greater than 50%  respiratory variability, suggesting right atrial pressure of 3 mmHg.   Medications:    amLODipine  5 mg Oral Daily   atorvastatin  40 mg Oral Daily   docusate sodium  100 mg Oral BID   fluticasone  2 spray Each Nare Daily   sodium chloride flush  3 mL Intravenous Q12H      sodium chloride     heparin 1,200 Units/hr (02/15/21 0718)   methocarbamol (ROBAXIN) IV      Assessment/Plan:  RA Mass:  stable thrombus on cardiac MRI 10/4 and 02/07/21  No evidence of embolus to lungs. Eliquis held for cervical neck surgery ? Friday On heparin  HTN:  Well controlled.  Continue current medications and low sodium Dash type diet.   HLD:  continue statin C-spine : multiple falls from ETOH abuse and c spine subluxation surgery Dr Annette Stable ? Friday   For Anesthesia would avoid any right IJ/subclavian lines for surgery given thrombus in SVC/RA junction    Jenkins Rouge 02/15/2021, 8:09 AM

## 2021-02-15 NOTE — Progress Notes (Signed)
ANTICOAGULATION CONSULT NOTE - Follow Up Consult  Pharmacy Consult for Heparin (Eliquis on hold) Indication: hx  RA thrombus  No Known Allergies  Patient Measurements: Height: 5\' 7"  (170.2 cm) Weight: 79.8 kg (176 lb) IBW/kg (Calculated) : 61.6 Heparin Dosing Weight: 79 kg  Vital Signs: Temp: 98.6 F (37 C) (01/11 0811) Temp Source: Oral (01/11 0811) BP: 153/84 (01/11 0954) Pulse Rate: 100 (01/11 0954)  Labs: Recent Labs    02/13/21 0230 02/13/21 0230 02/13/21 1934 02/14/21 0224 02/14/21 1634 02/15/21 0311  HGB 14.0  --   --  12.2  --  12.6  HCT 41.7  --   --  36.5  --  37.1  PLT 304  --   --  227  --  196  APTT  --    < > 72* 108* 101* 84*  LABPROT 13.3  --   --   --   --   --   INR 1.0  --   --   --   --   --   HEPARINUNFRC  --   --  1.09* >1.10*  --  0.94*  CREATININE 0.64  --   --   --   --  0.63   < > = values in this interval not displayed.     Estimated Creatinine Clearance: 83.4 mL/min (by C-G formula based on SCr of 0.63 mg/dL).  Assessment: 61 year old female admitted post fall with subluxation at C6-7.  Patient has a history of right atrial thrombus and on Eliquis prior to admission with plan for cervical spine surgery, tentatively on 02/17/21. Pharmacy consulted to change to IV Heparin.   Will use aPTTs for monitoring while heparin levels are falsely elevated from recent Eliquis doses. Last dose of Eliquis taken on 02/13/20 at 2200 PM.   aPTT now therapeutic x 2 on heparin at 1200 units/hr.    Goal of Therapy:  Heparin level 0.3-0.7 units/ml aPTT 66-102 seconds Monitor platelets by anticoagulation protocol: Yes   Plan:  Cont heparin drip at 1200 units/hr. Daily aPTT and heparin level until correlating; daily CBC. Eliquis on hold. OR scheduled for 4pm on 02/17/21.  Will follow up timing of holding heparin pre-op.   [employer], Pharm.D., BCPS Clinical Pharmacist Clinical phone for 02/15/2021 from 7:30-3:00 is [phone].  **Pharmacist phone  directory can be found on [email or web address] listed under [place].  02/15/2021 10:31 AM

## 2021-02-15 NOTE — Plan of Care (Signed)

## 2021-02-15 NOTE — Progress Notes (Signed)
Overall stable.  Patient's neck pain well controlled.  Still with right upper extremity numbness and weakness.  No obvious symptoms of alcohol withdrawal.  Vital signs are stable.  She is awake and alert.  She is oriented and reasonably appropriate.  Motor and sensory function stable with continued right triceps weakness and right grip and intrinsic weakness.  Chest and abdomen benign.  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  Plan 3 level anterior cervical decompression and fusion on Friday.  Patient to remain on heparin until the morning of surgery secondary to a right atrial hematoma.

## 2021-02-15 NOTE — Plan of Care (Signed)
  Problem: Education: Goal: Knowledge of General Education information will improve Description: Including pain rating scale, medication(s)/side effects and non-pharmacologic comfort measures Outcome: Not Progressing   Problem: Health Behavior/Discharge Planning: Goal: Ability to manage health-related needs will improve Outcome: Not Progressing   Problem: Clinical Measurements: Goal: Ability to maintain clinical measurements within normal limits will improve Outcome: Not Progressing Goal: Will remain free from infection Outcome: Not Progressing Goal: Diagnostic test results will improve Outcome: Not Progressing Goal: Respiratory complications will improve Outcome: Not Progressing Goal: Cardiovascular complication will be avoided Outcome: Not Progressing   Problem: Nutrition: Goal: Adequate nutrition will be maintained Outcome: Not Progressing   Problem: Coping: Goal: Level of anxiety will decrease Outcome: Not Progressing   Problem: Elimination: Goal: Will not experience complications related to bowel motility Outcome: Not Progressing Goal: Will not experience complications related to urinary retention Outcome: Not Progressing   

## 2021-02-16 DIAGNOSIS — Z7901 Long term (current) use of anticoagulants: Secondary | ICD-10-CM | POA: Diagnosis not present

## 2021-02-16 DIAGNOSIS — Z5181 Encounter for therapeutic drug level monitoring: Secondary | ICD-10-CM | POA: Diagnosis not present

## 2021-02-16 LAB — CBC
HCT: 35.3 % — ABNORMAL LOW (ref 36.0–46.0)
Hemoglobin: 12 g/dL (ref 12.0–15.0)
MCH: 29.6 pg (ref 26.0–34.0)
MCHC: 34 g/dL (ref 30.0–36.0)
MCV: 86.9 fL (ref 80.0–100.0)
Platelets: 183 10*3/uL (ref 150–400)
RBC: 4.06 MIL/uL (ref 3.87–5.11)
RDW: 16.4 % — ABNORMAL HIGH (ref 11.5–15.5)
WBC: 4.9 10*3/uL (ref 4.0–10.5)
nRBC: 0 % (ref 0.0–0.2)

## 2021-02-16 LAB — APTT: aPTT: 72 seconds — ABNORMAL HIGH (ref 24–36)

## 2021-02-16 LAB — HEPARIN LEVEL (UNFRACTIONATED): Heparin Unfractionated: 0.58 IU/mL (ref 0.30–0.70)

## 2021-02-16 NOTE — Progress Notes (Signed)
Subjective:  No cardiac complaints  No signs ETOH withdrawal Right arm numbness   Objective:  Vitals:   02/15/21 0954 02/15/21 1458 02/15/21 2110 02/16/21 0738  BP: (!) 153/84 114/77 117/67 120/75  Pulse: 100 99 93 88  Resp: 17 14 16 16   Temp:  99.3 F (37.4 C) 99.2 F (37.3 C) 98.7 F (37.1 C)  TempSrc:  Oral Oral Oral  SpO2: 99% 99% 99% 99%  Weight:      Height:        Intake/Output from previous day:  Intake/Output Summary (Last 24 hours) at 02/16/2021 0825 Last data filed at 02/15/2021 2127 Gross per 24 hour  Intake 630.73 ml  Output --  Net 630.73 ml    Physical Exam: Thin black female Cervical collar No murmur  Lungs clear No edema   Lab Results: Basic Metabolic Panel: Recent Labs    02/15/21 0311  NA 132*  K 4.3  CL 102  CO2 21*  GLUCOSE 111*  BUN 7  CREATININE 0.63  CALCIUM 9.1   Liver Function Tests: No results for input(s): AST, ALT, ALKPHOS, BILITOT, PROT, ALBUMIN in the last 72 hours.  No results for input(s): LIPASE, AMYLASE in the last 72 hours. CBC: Recent Labs    02/15/21 0311 02/16/21 0332  WBC 7.7 4.9  HGB 12.6 12.0  HCT 37.1 35.3*  MCV 87.5 86.9  PLT 196 183     Imaging: No results found.  Cardiac Studies:  ECG: ST rate 102 otherwise normal    Echo:  11/06/20  1. Images are limited. Unable to completely visualize right atrium. There  is an echodensity seen intermittently in the parasternal short axis and  apical four chamber views of uncertain significance - does not necessarily  appear to be in the same  position as filling defect on CT. Futhermore, there is a larger, complex  shaped echodensity seen in the left atrium only in the subcostal views  that could be due to off axis imaging of the atrial Sponsel but cannot  exclude mass. Would suggest TEE for  further clarification, although a directed cardiac CT could also be  considered if TEE is not feasible.   2. Left ventricular ejection fraction, by estimation,  is 55 to 60%. The  left ventricle has normal function. The left ventricle has no regional  Dukes motion abnormalities. Left ventricular diastolic parameters are  indeterminate.   3. Right ventricular systolic function is normal. The right ventricular  size is normal. There is normal pulmonary artery systolic pressure.   4. The pericardial effusion is posterior to the left ventricle.   5. The mitral valve is grossly normal. Trivial mitral valve  regurgitation.   6. The aortic valve is tricuspid. Aortic valve regurgitation is not  visualized.   7. The inferior vena cava is normal in size with greater than 50%  respiratory variability, suggesting right atrial pressure of 3 mmHg.   Medications:    amLODipine  5 mg Oral Daily   atorvastatin  40 mg Oral Daily   docusate sodium  100 mg Oral BID   fluticasone  2 spray Each Nare Daily   sodium chloride flush  3 mL Intravenous Q12H      sodium chloride     heparin 1,200 Units/hr (02/15/21 0718)   methocarbamol (ROBAXIN) IV      Assessment/Plan:   RA Mass:  stable thrombus on cardiac MRI 10/4 and 02/07/21  No evidence of embolus to lungs. Eliquis held for  cervical neck surgery ? Friday On heparin  HTN:  Well controlled.  Continue current medications and low sodium Dash type diet.   HLD:  continue statin C-spine : plan for 3 level anterior cervical decompression and fusion Friday   For Anesthesia would avoid any right IJ/subclavian lines for surgery given thrombus in SVC/RA junction    Jenkins Rouge 02/16/2021, 8:25 AM

## 2021-02-16 NOTE — Progress Notes (Signed)
° °  Providing Compassionate, Quality Care - Together   Subjective: Patient reports no issues overnight. Nurse reports no signs of ETOH withdrawal.  Objective: Vital signs in last 24 hours: Temp:  [98.7 F (37.1 C)-99.3 F (37.4 C)] 98.7 F (37.1 C) (01/12 0738) Pulse Rate:  [88-99] 88 (01/12 0738) Resp:  [14-16] 16 (01/12 0738) BP: (114-120)/(67-77) 120/75 (01/12 0738) SpO2:  [99 %] 99 % (01/12 0738)  Intake/Output from previous day: 01/11 0701 - 01/12 0700 In: 630.7 [I.V.:630.7] Out: -  Intake/Output this shift: No intake/output data recorded.  Alert and oriented x 4 PERRLA MAE, Strength and sensation intact aside from RUE weakness Chest and abdomen benign C-collar in place   Lab Results: Recent Labs    02/15/21 0311 02/16/21 0332  WBC 7.7 4.9  HGB 12.6 12.0  HCT 37.1 35.3*  PLT 196 183   BMET Recent Labs    02/15/21 0311  NA 132*  K 4.3  CL 102  CO2 21*  GLUCOSE 111*  BUN 7  CREATININE 0.63  CALCIUM 9.1    Studies/Results: No results found.  Assessment/Plan: Patient with cervical stenosis and myelopathy that is complicated by N4-7 fracture subluxation.  A three level anterior cervical decompression and fusion is planned for Friday.  Patient to remain on heparin until midnight the day of surgery, secondary to a right atrial hematoma.   LOS: 3 days   -Patient is to be NPO at midnight for surgery Friday around 1 pm -Heparin should be stopped at midnight   Viona Gilmore, [place], AGNP-C Nurse Practitioner  [facility] [place]. [address] [place], [place], [place] [address] P: [phone]     F: [phone]  02/16/2021, 10:19 AM

## 2021-02-16 NOTE — Plan of Care (Signed)
  Problem: Education: Goal: Knowledge of General Education information will improve Description: Including pain rating scale, medication(s)/side effects and non-pharmacologic comfort measures Outcome: Not Progressing   Problem: Health Behavior/Discharge Planning: Goal: Ability to manage health-related needs will improve Outcome: Not Progressing   Problem: Clinical Measurements: Goal: Ability to maintain clinical measurements within normal limits will improve Outcome: Not Progressing Goal: Will remain free from infection Outcome: Not Progressing Goal: Diagnostic test results will improve Outcome: Not Progressing Goal: Respiratory complications will improve Outcome: Not Progressing Goal: Cardiovascular complication will be avoided Outcome: Not Progressing   Problem: Nutrition: Goal: Adequate nutrition will be maintained Outcome: Not Progressing   Problem: Coping: Goal: Level of anxiety will decrease Outcome: Not Progressing   Problem: Elimination: Goal: Will not experience complications related to bowel motility Outcome: Not Progressing Goal: Will not experience complications related to urinary retention Outcome: Not Progressing   

## 2021-02-16 NOTE — Progress Notes (Signed)
ANTICOAGULATION CONSULT NOTE - Follow Up Consult  Pharmacy Consult for Heparin (Eliquis on hold) Indication: hx  RA thrombus  No Known Allergies  Patient Measurements: Height: 5\' 7"  (170.2 cm) Weight: 79.8 kg (176 lb) IBW/kg (Calculated) : 61.6 Heparin Dosing Weight: 79 kg  Vital Signs: Temp: 98.7 F (37.1 C) (01/12 0738) Temp Source: Oral (01/12 0738) BP: 120/75 (01/12 0738) Pulse Rate: 88 (01/12 0738)  Labs: Recent Labs    02/14/21 0224 02/14/21 1634 02/15/21 0311 02/16/21 0332  HGB 12.2  --  12.6 12.0  HCT 36.5  --  37.1 35.3*  PLT 227  --  196 183  APTT 108* 101* 84* 72*  HEPARINUNFRC >1.10*  --  0.94* 0.58  CREATININE  --   --  0.63  --      Estimated Creatinine Clearance: 83.4 mL/min (by C-G formula based on SCr of 0.63 mg/dL).  Assessment: 61 year old female admitted post fall with subluxation at C6-7.  Patient has a history of right atrial thrombus and on Eliquis prior to admission with plan for cervical spine surgery, tentatively on 02/17/21. Pharmacy consulted to change to IV Heparin.   Last dose of Eliquis taken on 02/13/20 at 2200 PM.   Heparin level and aPTT now correlating.  Can use heparin levels only for monitoring.  Heparin level therapeutic on heparin at 1200 units/hr.    Goal of Therapy:  Heparin level 0.3-0.7 units/ml Monitor platelets by anticoagulation protocol: Yes   Plan:  Continue heparin drip at 1200 units/hr. Daily heparin level and CBC. Eliquis on hold. OR scheduled for 4pm on 02/17/21.  Will follow up timing of holding heparin pre-op.   [employer], Pharm.D., BCPS Clinical Pharmacist Clinical phone for 02/16/2021 from 7:30-3:00 is [phone].  **Pharmacist phone directory can be found on [email or web address] listed under [facility].  02/16/2021 9:29 AM

## 2021-02-17 ENCOUNTER — Inpatient Hospital Stay (HOSPITAL_COMMUNITY): Payer: No Typology Code available for payment source

## 2021-02-17 ENCOUNTER — Encounter (HOSPITAL_COMMUNITY)
Admission: EM | Disposition: A | Discharge status: Home / Self Care | Payer: Self-pay | Source: Home / Self Care | Attending: Neurosurgery

## 2021-02-17 ENCOUNTER — Encounter (HOSPITAL_COMMUNITY): Payer: Self-pay | Admitting: Neurosurgery

## 2021-02-17 DIAGNOSIS — I513 Intracardiac thrombosis, not elsewhere classified: Secondary | ICD-10-CM | POA: Diagnosis not present

## 2021-02-17 HISTORY — PX: ANTERIOR CERVICAL DECOMP/DISCECTOMY FUSION: SHX1161

## 2021-02-17 LAB — TYPE AND SCREEN
ABO/RH(D): A POS
Antibody Screen: NEGATIVE

## 2021-02-17 LAB — CBC
HCT: 33.6 % — ABNORMAL LOW (ref 36.0–46.0)
Hemoglobin: 11.2 g/dL — ABNORMAL LOW (ref 12.0–15.0)
MCH: 29.4 pg (ref 26.0–34.0)
MCHC: 33.3 g/dL (ref 30.0–36.0)
MCV: 88.2 fL (ref 80.0–100.0)
Platelets: 174 10*3/uL (ref 150–400)
RBC: 3.81 MIL/uL — ABNORMAL LOW (ref 3.87–5.11)
RDW: 16.7 % — ABNORMAL HIGH (ref 11.5–15.5)
WBC: 4.8 10*3/uL (ref 4.0–10.5)
nRBC: 0 % (ref 0.0–0.2)

## 2021-02-17 LAB — SURGICAL PCR SCREEN
MRSA, PCR: NEGATIVE
Staphylococcus aureus: NEGATIVE

## 2021-02-17 IMAGING — RF DG CERVICAL SPINE 1V
1 series · 1 of 1 positions shown · non-contrast
Comparison: [DATE]

CLINICAL DATA: C4-C7 ACDF

EXAM:
DG CERVICAL SPINE - 1 VIEW

[Series 1: run · 1 of 1 slices shown]
[im 1/1]
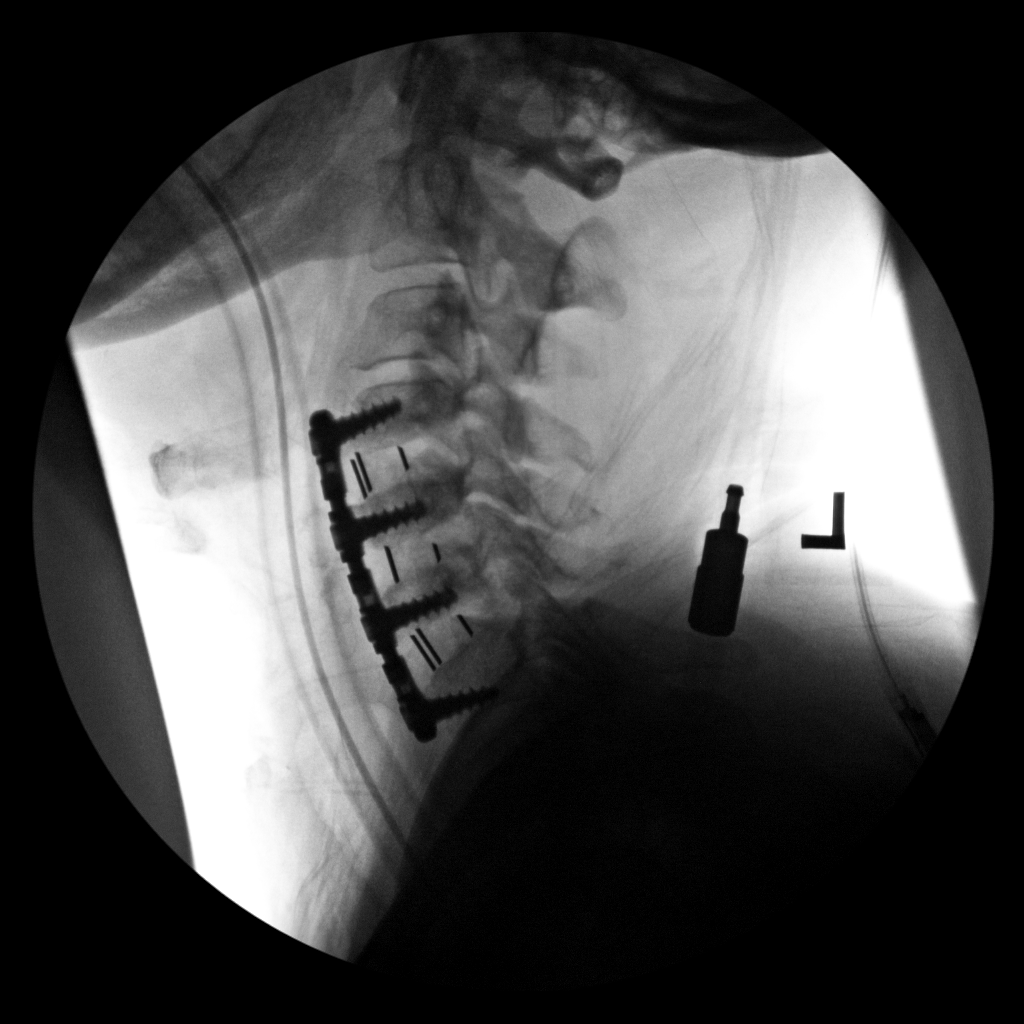

[1 of 1 positions shown; findings below may reference images not displayed]

FINDINGS: One fluoroscopic images obtained during the performance of procedure
and is provided for interpretation only. Anterior fusion plate and
intra corporal screws are seen spanning the C4 through C7 levels,
with intervening disc spacers. Alignment is anatomic. Please refer
to the operative report.

FLUOROSCOPY TIME:  7 seconds, 0.39 mGy
IMPRESSION: 1. C4-C7 ACDF.

## 2021-02-17 SURGERY — ANTERIOR CERVICAL DECOMPRESSION/DISCECTOMY FUSION 3 LEVELS
Anesthesia: General | Site: Neck

## 2021-02-17 MED ORDER — DEXMEDETOMIDINE (PRECEDEX) IN NS 20 MCG/5ML (4 MCG/ML) IV SYRINGE
PREFILLED_SYRINGE | INTRAVENOUS | Status: AC
  Filled 2021-02-17: qty 5

## 2021-02-17 MED ORDER — FENTANYL CITRATE (PF) 100 MCG/2ML IJ SOLN: 25.0000 ug | INTRAMUSCULAR | Status: DC | PRN

## 2021-02-17 MED ORDER — THROMBIN 20000 UNITS EX SOLR
CUTANEOUS | Status: AC
  Filled 2021-02-17: qty 20000

## 2021-02-17 MED ORDER — LIDOCAINE 2% (20 MG/ML) 5 ML SYRINGE
INTRAMUSCULAR | Status: AC
  Filled 2021-02-17: qty 10

## 2021-02-17 MED ORDER — CEFAZOLIN SODIUM-DEXTROSE 2-4 GM/100ML-% IV SOLN
2.0000 g | INTRAVENOUS | Status: AC
  Administered 2021-02-17: 2 g via INTRAVENOUS

## 2021-02-17 MED ORDER — ORAL CARE MOUTH RINSE: 15.0000 mL | Freq: Once | OROMUCOSAL | Status: AC

## 2021-02-17 MED ORDER — CHLORHEXIDINE GLUCONATE CLOTH 2 % EX PADS
6.0000 | MEDICATED_PAD | Freq: Once | CUTANEOUS | Status: AC
  Administered 2021-02-17: 6 via TOPICAL

## 2021-02-17 MED ORDER — OXYCODONE HCL 5 MG PO TABS: 5.0000 mg | ORAL_TABLET | Freq: Once | ORAL | Status: DC | PRN

## 2021-02-17 MED ORDER — ONDANSETRON HCL 4 MG/2ML IJ SOLN: 4.0000 mg | Freq: Four times a day (QID) | INTRAMUSCULAR | Status: DC | PRN

## 2021-02-17 MED ORDER — FENTANYL CITRATE (PF) 250 MCG/5ML IJ SOLN
INTRAMUSCULAR | Status: DC | PRN
  Administered 2021-02-17 (×3): 50 ug via INTRAVENOUS
  Administered 2021-02-17: 100 ug via INTRAVENOUS

## 2021-02-17 MED ORDER — CHLORHEXIDINE GLUCONATE 0.12 % MT SOLN: 15.0000 mL | Freq: Once | OROMUCOSAL | Status: AC

## 2021-02-17 MED ORDER — LACTATED RINGERS IV SOLN: INTRAVENOUS | Status: DC

## 2021-02-17 MED ORDER — LIDOCAINE 2% (20 MG/ML) 5 ML SYRINGE
INTRAMUSCULAR | Status: DC | PRN
  Administered 2021-02-17: 60 mg via INTRAVENOUS

## 2021-02-17 MED ORDER — ROCURONIUM BROMIDE 10 MG/ML (PF) SYRINGE
PREFILLED_SYRINGE | INTRAVENOUS | Status: DC | PRN
  Administered 2021-02-17: 20 mg via INTRAVENOUS
  Administered 2021-02-17: 50 mg via INTRAVENOUS

## 2021-02-17 MED ORDER — PHENOL 1.4 % MT LIQD
1.0000 | OROMUCOSAL | Status: DC | PRN
Start: 2021-02-17 — End: 2021-02-19
  Filled 2021-02-17: qty 177

## 2021-02-17 MED ORDER — SODIUM CHLORIDE 0.9% FLUSH: 3.0000 mL | INTRAVENOUS | Status: DC | PRN

## 2021-02-17 MED ORDER — SODIUM CHLORIDE (PF) 0.9 % IJ SOLN
INTRAMUSCULAR | Status: AC
  Filled 2021-02-17: qty 10

## 2021-02-17 MED ORDER — SUGAMMADEX SODIUM 200 MG/2ML IV SOLN
INTRAVENOUS | Status: DC | PRN
  Administered 2021-02-17: 200 mg via INTRAVENOUS

## 2021-02-17 MED ORDER — SODIUM CHLORIDE 0.9 % IV SOLN
250.0000 mL | INTRAVENOUS | Status: DC
  Administered 2021-02-17: 250 mL via INTRAVENOUS

## 2021-02-17 MED ORDER — CEFAZOLIN SODIUM-DEXTROSE 2-4 GM/100ML-% IV SOLN
INTRAVENOUS | Status: AC
  Filled 2021-02-17: qty 100

## 2021-02-17 MED ORDER — PHENYLEPHRINE HCL-NACL 20-0.9 MG/250ML-% IV SOLN
INTRAVENOUS | Status: DC | PRN
  Administered 2021-02-17: 35 ug/min via INTRAVENOUS

## 2021-02-17 MED ORDER — SODIUM CHLORIDE 0.9% FLUSH
3.0000 mL | Freq: Two times a day (BID) | INTRAVENOUS | Status: DC
  Administered 2021-02-17 – 2021-02-18 (×3): 3 mL via INTRAVENOUS

## 2021-02-17 MED ORDER — CHLORHEXIDINE GLUCONATE 0.12 % MT SOLN
OROMUCOSAL | Status: AC
  Administered 2021-02-17: 15 mL via OROMUCOSAL
  Filled 2021-02-17: qty 15

## 2021-02-17 MED ORDER — PHENYLEPHRINE 40 MCG/ML (10ML) SYRINGE FOR IV PUSH (FOR BLOOD PRESSURE SUPPORT)
PREFILLED_SYRINGE | INTRAVENOUS | Status: AC
  Filled 2021-02-17: qty 10

## 2021-02-17 MED ORDER — THROMBIN 5000 UNITS EX SOLR
CUTANEOUS | Status: AC
  Filled 2021-02-17: qty 5000

## 2021-02-17 MED ORDER — DEXAMETHASONE SODIUM PHOSPHATE 10 MG/ML IJ SOLN
INTRAMUSCULAR | Status: AC
  Filled 2021-02-17: qty 2

## 2021-02-17 MED ORDER — ONDANSETRON HCL 4 MG/2ML IJ SOLN
INTRAMUSCULAR | Status: AC
  Filled 2021-02-17: qty 4

## 2021-02-17 MED ORDER — OXYCODONE HCL 5 MG/5ML PO SOLN: 5.0000 mg | Freq: Once | ORAL | Status: DC | PRN

## 2021-02-17 MED ORDER — THROMBIN 20000 UNITS EX SOLR: CUTANEOUS | Status: DC | PRN

## 2021-02-17 MED ORDER — MIDAZOLAM HCL 2 MG/2ML IJ SOLN
INTRAMUSCULAR | Status: DC | PRN
Start: 2021-02-17 — End: 2021-02-17
  Administered 2021-02-17: 2 mg via INTRAVENOUS

## 2021-02-17 MED ORDER — HYDROMORPHONE HCL 1 MG/ML IJ SOLN: 1.0000 mg | INTRAMUSCULAR | Status: DC | PRN

## 2021-02-17 MED ORDER — 0.9 % SODIUM CHLORIDE (POUR BTL) OPTIME
TOPICAL | Status: DC | PRN
  Administered 2021-02-17: 1000 mL

## 2021-02-17 MED ORDER — MENTHOL 3 MG MT LOZG: 1.0000 | LOZENGE | OROMUCOSAL | Status: DC | PRN

## 2021-02-17 MED ORDER — THROMBIN 5000 UNITS EX SOLR: CUTANEOUS | Status: DC | PRN

## 2021-02-17 MED ORDER — ACETAMINOPHEN 325 MG PO TABS: 650.0000 mg | ORAL_TABLET | ORAL | Status: DC | PRN

## 2021-02-17 MED ORDER — DEXMEDETOMIDINE (PRECEDEX) IN NS 20 MCG/5ML (4 MCG/ML) IV SYRINGE
PREFILLED_SYRINGE | INTRAVENOUS | Status: DC | PRN
  Administered 2021-02-17 (×2): 8 ug via INTRAVENOUS
  Administered 2021-02-17: 4 ug via INTRAVENOUS
  Administered 2021-02-17: 8 ug via INTRAVENOUS
  Administered 2021-02-17: 12 ug via INTRAVENOUS

## 2021-02-17 MED ORDER — FENTANYL CITRATE (PF) 250 MCG/5ML IJ SOLN
INTRAMUSCULAR | Status: AC
  Filled 2021-02-17: qty 5

## 2021-02-17 MED ORDER — CEFAZOLIN SODIUM-DEXTROSE 1-4 GM/50ML-% IV SOLN
1.0000 g | Freq: Three times a day (TID) | INTRAVENOUS | Status: AC
  Administered 2021-02-17 – 2021-02-18 (×2): 1 g via INTRAVENOUS
  Filled 2021-02-17 (×2): qty 50

## 2021-02-17 MED ORDER — ACETAMINOPHEN 650 MG RE SUPP: 650.0000 mg | RECTAL | Status: DC | PRN

## 2021-02-17 MED ORDER — PROPOFOL 10 MG/ML IV BOLUS
INTRAVENOUS | Status: AC
  Filled 2021-02-17: qty 20

## 2021-02-17 MED ORDER — PROPOFOL 10 MG/ML IV BOLUS
INTRAVENOUS | Status: DC | PRN
Start: 2021-02-17 — End: 2021-02-17
  Administered 2021-02-17: 200 mg via INTRAVENOUS

## 2021-02-17 MED ORDER — MIDAZOLAM HCL 2 MG/2ML IJ SOLN
INTRAMUSCULAR | Status: AC
  Filled 2021-02-17: qty 2

## 2021-02-17 MED ORDER — DEXAMETHASONE SODIUM PHOSPHATE 10 MG/ML IJ SOLN
INTRAMUSCULAR | Status: DC | PRN
  Administered 2021-02-17: 10 mg via INTRAVENOUS

## 2021-02-17 MED ORDER — ONDANSETRON HCL 4 MG/2ML IJ SOLN
INTRAMUSCULAR | Status: DC | PRN
  Administered 2021-02-17: 4 mg via INTRAVENOUS

## 2021-02-17 MED ORDER — ROCURONIUM BROMIDE 10 MG/ML (PF) SYRINGE
PREFILLED_SYRINGE | INTRAVENOUS | Status: AC
  Filled 2021-02-17: qty 20

## 2021-02-17 MED ORDER — HYDROCODONE-ACETAMINOPHEN 10-325 MG PO TABS: 2.0000 | ORAL_TABLET | ORAL | Status: DC | PRN

## 2021-02-17 SURGICAL SUPPLY — 55 items
BAG COUNTER SPONGE SURGICOUNT (BAG) ×2 IMPLANT
BAG DECANTER FOR FLEXI CONT (MISCELLANEOUS) ×2 IMPLANT
BAND RUBBER #18 3X1/16 STRL (MISCELLANEOUS) ×4 IMPLANT
BENZOIN TINCTURE PRP APPL 2/3 (GAUZE/BANDAGES/DRESSINGS) ×2 IMPLANT
BIT DRILL 13 (BIT) ×1 IMPLANT
BUR MATCHSTICK NEURO 3.0 LAGG (BURR) ×2 IMPLANT
CAGE PEEK 6X14X11 (Cage) ×2 IMPLANT
CAGE PEEK 7X14X11 (Cage) ×4 IMPLANT
CANISTER SUCT 3000ML PPV (MISCELLANEOUS) ×2 IMPLANT
CARTRIDGE OIL MAESTRO DRILL (MISCELLANEOUS) ×1 IMPLANT
DIFFUSER DRILL AIR PNEUMATIC (MISCELLANEOUS) ×2 IMPLANT
DRAPE C-ARM 42X72 X-RAY (DRAPES) ×4 IMPLANT
DRAPE LAPAROTOMY 100X72 PEDS (DRAPES) ×2 IMPLANT
DRAPE MICROSCOPE LEICA (MISCELLANEOUS) ×2 IMPLANT
DURAPREP 6ML APPLICATOR 50/CS (WOUND CARE) ×2 IMPLANT
ELECT COATED BLADE 2.86 ST (ELECTRODE) ×2 IMPLANT
ELECT REM PT RETURN 9FT ADLT (ELECTROSURGICAL) ×2
ELECTRODE REM PT RTRN 9FT ADLT (ELECTROSURGICAL) ×1 IMPLANT
EVACUATOR 1/8 PVC DRAIN (DRAIN) ×1 IMPLANT
GAUZE 4X4 16PLY ~~LOC~~+RFID DBL (SPONGE) IMPLANT
GAUZE SPONGE 4X4 12PLY STRL (GAUZE/BANDAGES/DRESSINGS) ×2 IMPLANT
GLOVE EXAM NITRILE XL STR (GLOVE) IMPLANT
GLOVE SURG LTX SZ9 (GLOVE) ×2 IMPLANT
GOWN STRL REUS W/ TWL LRG LVL3 (GOWN DISPOSABLE) IMPLANT
GOWN STRL REUS W/ TWL XL LVL3 (GOWN DISPOSABLE) IMPLANT
GOWN STRL REUS W/TWL 2XL LVL3 (GOWN DISPOSABLE) IMPLANT
GOWN STRL REUS W/TWL LRG LVL3 (GOWN DISPOSABLE)
GOWN STRL REUS W/TWL XL LVL3 (GOWN DISPOSABLE)
HALTER HD/CHIN CERV TRACTION D (MISCELLANEOUS) ×2 IMPLANT
HEMOSTAT POWDER KIT SURGIFOAM (HEMOSTASIS) ×2 IMPLANT
KIT BASIN OR (CUSTOM PROCEDURE TRAY) ×2 IMPLANT
KIT TURNOVER KIT B (KITS) ×2 IMPLANT
NDL SPNL 20GX3.5 QUINCKE YW (NEEDLE) ×1 IMPLANT
NEEDLE SPNL 20GX3.5 QUINCKE YW (NEEDLE) ×2 IMPLANT
NS IRRIG 1000ML POUR BTL (IV SOLUTION) ×2 IMPLANT
OIL CARTRIDGE MAESTRO DRILL (MISCELLANEOUS) ×2
PACK LAMINECTOMY NEURO (CUSTOM PROCEDURE TRAY) ×2 IMPLANT
PAD ARMBOARD 7.5X6 YLW CONV (MISCELLANEOUS) ×6 IMPLANT
PLATE 3 57.5XLCK NS SPNE CVD (Plate) IMPLANT
PLATE 3 ATLANTIS TRANS (Plate) ×2 IMPLANT
SCREW ST FIX 4 ATL 3120213 (Screw) ×8 IMPLANT
SPACER SPNL 11X14X6XPEEK CVD (Cage) IMPLANT
SPACER SPNL 11X14X7XPEEK CVD (Cage) IMPLANT
SPCR SPNL 11X14X6XPEEK CVD (Cage) ×1 IMPLANT
SPCR SPNL 11X14X7XPEEK CVD (Cage) ×2 IMPLANT
SPONGE INTESTINAL PEANUT (DISPOSABLE) ×2 IMPLANT
SPONGE SURGIFOAM ABS GEL 100 (HEMOSTASIS) ×2 IMPLANT
STRIP CLOSURE SKIN 1/2X4 (GAUZE/BANDAGES/DRESSINGS) ×2 IMPLANT
SUT VIC AB 3-0 SH 8-18 (SUTURE) ×2 IMPLANT
SUT VIC AB 4-0 RB1 18 (SUTURE) ×2 IMPLANT
TAPE CLOTH 4X10 WHT NS (GAUZE/BANDAGES/DRESSINGS) ×2 IMPLANT
TOWEL GREEN STERILE (TOWEL DISPOSABLE) ×2 IMPLANT
TOWEL GREEN STERILE FF (TOWEL DISPOSABLE) ×2 IMPLANT
TRAP SPECIMEN MUCUS 40CC (MISCELLANEOUS) ×2 IMPLANT
WATER STERILE IRR 1000ML POUR (IV SOLUTION) ×2 IMPLANT

## 2021-02-17 NOTE — Progress Notes (Signed)
Orthopedic Tech Progress Note Patient Details:  Karen Robertson 15-Sep-1960 [id number]  Ortho Devices Type of Ortho Device: Soft collar Ortho Device/Splint Location: neck Ortho Device/Splint Interventions: Ordered   Post Interventions Patient Tolerated: Well  Edwina Barth 02/17/2021, 6:52 PM
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
## 2021-02-17 NOTE — Anesthesia Preprocedure Evaluation (Signed)
Anesthesia Evaluation  Patient identified by MRN, date of birth, ID band Patient awake    Reviewed: Allergy & Precautions, H&P , NPO status , Patient's Chart, lab work & pertinent test results  Airway Mallampati: II   Neck ROM: full    Dental   Pulmonary sleep apnea ,    breath sounds clear to auscultation       Cardiovascular hypertension,  Rhythm:regular Rate:Normal     Neuro/Psych    GI/Hepatic   Endo/Other    Renal/GU      Musculoskeletal   Abdominal   Peds  Hematology   Anesthesia Other Findings   Reproductive/Obstetrics                            Anesthesia Physical Anesthesia Plan  ASA: 2  Anesthesia Plan: General   Post-op Pain Management:    Induction: Intravenous  PONV Risk Score and Plan: 3 and Ondansetron, Dexamethasone, Midazolam and Treatment may vary due to age or medical condition  Airway Management Planned: Oral ETT and Video Laryngoscope Planned  Additional Equipment:   Intra-op Plan:   Post-operative Plan: Extubation in OR  Informed Consent: I have reviewed the patients History and Physical, chart, labs and discussed the procedure including the risks, benefits and alternatives for the proposed anesthesia with the patient or authorized representative who has indicated his/her understanding and acceptance.     Dental advisory given  Plan Discussed with: Anesthesiologist, Surgeon and CRNA  Anesthesia Plan Comments:         Anesthesia Quick Evaluation

## 2021-02-17 NOTE — Brief Op Note (Signed)
02/17/2021  5:23 PM  PATIENT:  Karen Robertson  61 y.o. female  PRE-OPERATIVE DIAGNOSIS:  C6-7 Subluxation  POST-OPERATIVE DIAGNOSIS:  C6-7 Subluxation  PROCEDURE:  Procedure(s): C4-5 C5-6 C6-7 ACDF (N/A)  SURGEON:  Surgeon(s) and Role:    Earnie Larsson, MD - Primary  PHYSICIAN ASSISTANT:   ASSISTANTS:  Cephas Darby  ANESTHESIA:   general  EBL:  150 mL   BLOOD ADMINISTERED:none  DRAINS: Med hemovac   LOCAL MEDICATIONS USED:  NONE  SPECIMEN:  No Specimen  DISPOSITION OF SPECIMEN:  N/A  COUNTS:  YES  TOURNIQUET:  * No tourniquets in log *  DICTATION: .Dragon Dictation  PLAN OF CARE: Admit to inpatient   PATIENT DISPOSITION:  PACU - hemodynamically stable.   Delay start of Pharmacological VTE agent (>24hrs) due to surgical blood loss or risk of bleeding: yes

## 2021-02-17 NOTE — Interval H&P Note (Signed)
History and Physical Interval Note:  02/17/2021 2:49 PM  Karen Robertson  has presented today for surgery, with the diagnosis of C6-7 Subluxation.  The various methods of treatment have been discussed with the patient and family. After consideration of risks, benefits and other options for treatment, the patient has consented to  Procedure(s): C4-5 C5-6 C6-7 ACDF (N/A) as a surgical intervention.  The patient's history has been reviewed, patient examined, no change in status, stable for surgery.  I have reviewed the patient's chart and labs.  Questions were answered to the patient's satisfaction.     Karen Robertson

## 2021-02-17 NOTE — Transfer of Care (Signed)
Immediate Anesthesia Transfer of Care Note  Patient: Karen Robertson  Procedure(s) Performed: C4-5 C5-6 C6-7 ACDF (Neck)  Patient Location: PACU  Anesthesia Type:General  Level of Consciousness: drowsy, patient cooperative and responds to stimulation  Airway & Oxygen Therapy: Patient Spontanous Breathing  Post-op Assessment: Report given to RN and Post -op Vital signs reviewed and stable  Post vital signs: Reviewed and stable  Last Vitals:  Vitals Value Taken Time  BP 130/81 02/17/21 1736  Temp    Pulse 87 02/17/21 1738  Resp 14 02/17/21 1738  SpO2 100 % 02/17/21 1738  Vitals shown include unvalidated device data.  Last Pain:  Vitals:   02/17/21 1330  TempSrc: Oral  PainSc:       Patients Stated Pain Goal: 0 (30/86/57 8469)  Complications: No notable events documented.

## 2021-02-17 NOTE — Anesthesia Procedure Notes (Signed)
Procedure Name: Intubation Date/Time: 02/17/2021 3:21 PM Performed by: Inda Coke, CRNA Pre-anesthesia Checklist: Patient identified, Emergency Drugs available, Suction available and Patient being monitored Patient Re-evaluated:Patient Re-evaluated prior to induction Oxygen Delivery Method: Circle System Utilized Preoxygenation: Pre-oxygenation with 100% oxygen Induction Type: IV induction Ventilation: Mask ventilation without difficulty Laryngoscope Size: Glidescope and 3 Grade View: Grade I Tube type: Oral Tube size: 7.0 mm Number of attempts: 1 Airway Equipment and Method: Oral airway and Rigid stylet Placement Confirmation: ETT inserted through vocal cords under direct vision, positive ETCO2 and breath sounds checked- equal and bilateral Secured at: 22 cm Tube secured with: Tape Dental Injury: Teeth and Oropharynx as per pre-operative assessment  Comments: Smooth intubation with elective glidescope. C-collar remained in place during intubation. Head and neck maintained midline and neutral.

## 2021-02-17 NOTE — Progress Notes (Signed)
Subjective:  No cardiac complaints   Objective:  Vitals:   02/16/21 0738 02/16/21 1517 02/16/21 2223 02/17/21 0718  BP: 120/75 117/69 112/72 (!) 131/115  Pulse: 88 96 87 86  Resp: 16 16 16 16   Temp: 98.7 F (37.1 C) 99.3 F (37.4 C) 99.4 F (37.4 C) 98.3 F (36.8 C)  TempSrc: Oral Oral Oral Oral  SpO2: 99% 97% 100% 99%  Weight:      Height:        Intake/Output from previous day:  Intake/Output Summary (Last 24 hours) at 02/17/2021 0751 Last data filed at 02/16/2021 2224 Gross per 24 hour  Intake 295.11 ml  Output --  Net 295.11 ml    Physical Exam: Thin black female Cervical collar No murmur  Lungs clear No edema   Lab Results: Basic Metabolic Panel: Recent Labs    02/15/21 0311  NA 132*  K 4.3  CL 102  CO2 21*  GLUCOSE 111*  BUN 7  CREATININE 0.63  CALCIUM 9.1    CBC: Recent Labs    02/16/21 0332 02/17/21 0303  WBC 4.9 4.8  HGB 12.0 11.2*  HCT 35.3* 33.6*  MCV 86.9 88.2  PLT 183 174     Imaging: No results found.  Cardiac Studies:  ECG: ST rate 102 otherwise normal    Echo:  11/06/20  1. Images are limited. Unable to completely visualize right atrium. There  is an echodensity seen intermittently in the parasternal short axis and  apical four chamber views of uncertain significance - does not necessarily  appear to be in the same  position as filling defect on CT. Futhermore, there is a larger, complex  shaped echodensity seen in the left atrium only in the subcostal views  that could be due to off axis imaging of the atrial Klecker but cannot  exclude mass. Would suggest TEE for  further clarification, although a directed cardiac CT could also be  considered if TEE is not feasible.   2. Left ventricular ejection fraction, by estimation, is 55 to 60%. The  left ventricle has normal function. The left ventricle has no regional  Voth motion abnormalities. Left ventricular diastolic parameters are  indeterminate.   3. Right  ventricular systolic function is normal. The right ventricular  size is normal. There is normal pulmonary artery systolic pressure.   4. The pericardial effusion is posterior to the left ventricle.   5. The mitral valve is grossly normal. Trivial mitral valve  regurgitation.   6. The aortic valve is tricuspid. Aortic valve regurgitation is not  visualized.   7. The inferior vena cava is normal in size with greater than 50%  respiratory variability, suggesting right atrial pressure of 3 mmHg.   Medications:    amLODipine  5 mg Oral Daily   atorvastatin  40 mg Oral Daily   docusate sodium  100 mg Oral BID   fluticasone  2 spray Each Nare Daily   sodium chloride flush  3 mL Intravenous Q12H      sodium chloride     methocarbamol (ROBAXIN) IV      Assessment/Plan:   RA Mass:  stable thrombus on cardiac MRI 10/4 and 02/07/21  No evidence of embolus to lungs. Eliquis held for cervical neck surgery 4:00 today with Dr Annette Stable  Heparin d/c for surgery  HTN:  Well controlled.  Continue current medications and low sodium Dash type diet.   HLD:  continue statin C-spine : plan for 3 level anterior  cervical decompression and fusion latter today   For Anesthesia would avoid any right IJ/subclavian lines for surgery given thrombus in SVC/RA junction    Jenkins Rouge 02/17/2021, 7:51 AM

## 2021-02-17 NOTE — Progress Notes (Signed)
Overall stable.  Patient ready for surgery.  Heparin drip off.  Questions answered.  Proceed with 3 level anterior cervical discectomy and fusion.

## 2021-02-18 LAB — CBC
HCT: 36.6 % (ref 36.0–46.0)
Hemoglobin: 12.1 g/dL (ref 12.0–15.0)
MCH: 28.7 pg (ref 26.0–34.0)
MCHC: 33.1 g/dL (ref 30.0–36.0)
MCV: 86.9 fL (ref 80.0–100.0)
Platelets: 179 10*3/uL (ref 150–400)
RBC: 4.21 MIL/uL (ref 3.87–5.11)
RDW: 15.9 % — ABNORMAL HIGH (ref 11.5–15.5)
WBC: 7.6 10*3/uL (ref 4.0–10.5)
nRBC: 0 % (ref 0.0–0.2)

## 2021-02-18 MED ORDER — DEXAMETHASONE SODIUM PHOSPHATE 10 MG/ML IJ SOLN
4.0000 mg | Freq: Three times a day (TID) | INTRAMUSCULAR | Status: AC
  Administered 2021-02-18 – 2021-02-19 (×3): 4 mg via INTRAVENOUS
  Filled 2021-02-18 (×3): qty 1
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
## 2021-02-18 NOTE — Progress Notes (Signed)
Neurosurgery Service Progress Note  Subjective: No acute events overnight, some odynophagia and dysphagia, RUE feels subjectively improved from preop   Objective: Vitals:   02/17/21 1831 02/17/21 2013 02/18/21 0410 02/18/21 0832  BP: 130/81 127/80 126/82 (!) 145/88  Pulse: 87 92 95 100  Resp: 19   18  Temp: 98.9 F (37.2 C) 98.7 F (37.1 C) 98.1 F (36.7 C) 98.6 F (37 C)  TempSrc: Oral Oral Oral Oral  SpO2: 100% 100% 100% 97%  Weight:      Height:        Physical Exam: Strength 5/5 except 4/5 in proximal RUE bi/tri/delt, 4+/5 distally, SILTx4, no hoffman's, no clonus Incision c/d/I, neck soft  Assessment & Plan: 61 y.o. woman s/p 3 level ACDF for stenosis / unstable fracture, recovering well.  -hold anticoagulation until POD4 or POD5, Dr. Annette Stable to re-evaluate timing on Monday -diet as tolerated -will start 24h of dexamethasone to help with esophageal / prevertebral edema -PT/OT eval -SCDs/TEDs  Karen Robertson  02/18/21 9:45 AM
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
## 2021-02-18 NOTE — Progress Notes (Signed)
Physical Therapy Discharge Patient Details Name: Karen Robertson MRN: [id number] DOB: 01-18-1961 Today's Date: 02/18/2021 Time: 5361-4431 PT Time Calculation (min) (ACUTE ONLY): 17 min  Patient discharged from PT services secondary to goals met and no further PT needs identified.  Please see latest therapy progress note for current level of functioning and progress toward goals.    Progress and discharge plan discussed with patient and/or caregiver: Patient/Caregiver agrees with plan  GP    Karen Robertson A. Treyshon Buchanon, PT, DPT Acute Rehabilitation Services Office: [place] 02/18/2021, 10:46 AM

## 2021-02-18 NOTE — Anesthesia Postprocedure Evaluation (Signed)
Anesthesia Post Note  Patient: Karen Robertson  Procedure(s) Performed: C4-5 C5-6 C6-7 ACDF (Neck)     Patient location during evaluation: PACU Anesthesia Type: General Level of consciousness: awake and alert Pain management: pain level controlled Vital Signs Assessment: post-procedure vital signs reviewed and stable Respiratory status: spontaneous breathing, nonlabored ventilation, respiratory function stable and patient connected to nasal cannula oxygen Cardiovascular status: blood pressure returned to baseline and stable Postop Assessment: no apparent nausea or vomiting Anesthetic complications: no   No notable events documented.  Last Vitals:  Vitals:   02/18/21 0832 02/18/21 1429  BP: (!) 145/88 131/82  Pulse: 100 97  Resp: 18 18  Temp: 37 C 37.3 C  SpO2: 97% 99%    Last Pain:  Vitals:   02/18/21 1429  TempSrc: Oral  PainSc:                  Tiajuana Amass

## 2021-02-19 MED ORDER — APIXABAN 5 MG PO TABS: 5.0000 mg | ORAL_TABLET | Freq: Two times a day (BID) | ORAL | 1 refills | Status: AC

## 2021-02-19 MED ORDER — HYDROCODONE-ACETAMINOPHEN 5-325 MG PO TABS: 1.0000 | ORAL_TABLET | Freq: Four times a day (QID) | ORAL | 0 refills | Status: AC | PRN
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
## 2021-02-19 NOTE — Progress Notes (Signed)
AVS d/c teaching has been provided to pt with family at bedside. Pt has verbalized understanding of teaching at this time. Pt's IV has been removed and pt has gathered her belongings in anticipation of d/c. Pt is medically stable for d/c at this time.

## 2021-02-19 NOTE — TOC Transition Note (Signed)
Transition of Care [facility]) - CM/SW Discharge Note   Patient Details  Name: ERMALINDA JOUBERT MRN: [id number] Date of Birth: [birth year]  [facility] CM/SW Contact:  Bartholomew Crews, RN Phone Number: [phone] 02/19/2021, 3:15 PM   Clinical Narrative:     Patient to transition home today. No TOC needs. Noted PT evaluation and progress toward goals.   Final next level of care: Home/Self Care Barriers to Discharge: No Barriers Identified   Patient Goals and CMS Choice        Discharge Placement                       Discharge Plan and Services                                     Social Determinants of Health (SDOH) Interventions     Readmission Risk Interventions No flowsheet data found.

## 2021-02-19 NOTE — Plan of Care (Signed)

## 2021-02-19 NOTE — Progress Notes (Signed)
Mobility Specialist Criteria Algorithm Info.   02/19/21 1400  Mobility  Activity Ambulated in hall;Dangled on edge of bed  Range of Motion/Exercises Active;All extremities  Level of Assistance Independent  Assistive Device None  Distance Ambulated (ft) 550 ft  Mobility Ambulated with assistance in hallway  Mobility Response Tolerated well  Mobility performed by Mobility specialist  Bed Position Semi-fowlers   Patient ambulated in hallway independently with steady gait. Tolerated ambulation well without complaint or incident. Was left dangling with all needs met and call bell in reach.    02/19/2021 4:14 PM  [place] Francisca Harbuck, [place], [place]  EVQWQ:[phone] Office: [phone]

## 2021-02-19 NOTE — Plan of Care (Signed)
°  Problem: Education: Goal: Knowledge of General Education information will improve Description: Including pain rating scale, medication(s)/side effects and non-pharmacologic comfort measures 02/19/2021 1522 by Damaris Schooner, RN Outcome: Adequate for Discharge 02/19/2021 1041 by Damaris Schooner, RN Outcome: Progressing   Problem: Health Behavior/Discharge Planning: Goal: Ability to manage health-related needs will improve 02/19/2021 1522 by Damaris Schooner, RN Outcome: Adequate for Discharge 02/19/2021 1041 by Damaris Schooner, RN Outcome: Progressing   Problem: Clinical Measurements: Goal: Ability to maintain clinical measurements within normal limits will improve 02/19/2021 1522 by Damaris Schooner, RN Outcome: Adequate for Discharge 02/19/2021 1041 by Damaris Schooner, RN Outcome: Progressing Goal: Will remain free from infection 02/19/2021 1522 by Damaris Schooner, RN Outcome: Adequate for Discharge 02/19/2021 1041 by Damaris Schooner, RN Outcome: Progressing Goal: Diagnostic test results will improve 02/19/2021 1522 by Damaris Schooner, RN Outcome: Adequate for Discharge 02/19/2021 1041 by Damaris Schooner, RN Outcome: Progressing Goal: Respiratory complications will improve 02/19/2021 1522 by Damaris Schooner, RN Outcome: Adequate for Discharge 02/19/2021 1041 by Damaris Schooner, RN Outcome: Progressing Goal: Cardiovascular complication will be avoided 02/19/2021 1522 by Damaris Schooner, RN Outcome: Adequate for Discharge 02/19/2021 1041 by Damaris Schooner, RN Outcome: Progressing   Problem: Activity: Goal: Risk for activity intolerance will decrease 02/19/2021 1522 by Damaris Schooner, RN Outcome: Adequate for Discharge 02/19/2021 1041 by Damaris Schooner, RN Outcome: Progressing   Problem: Nutrition: Goal: Adequate nutrition will be maintained 02/19/2021 1522 by Damaris Schooner, RN Outcome: Adequate for Discharge 02/19/2021 1041 by Damaris Schooner, RN Outcome: Progressing    Problem: Coping: Goal: Level of anxiety will decrease 02/19/2021 1522 by Damaris Schooner, RN Outcome: Adequate for Discharge 02/19/2021 1041 by Damaris Schooner, RN Outcome: Progressing   Problem: Elimination: Goal: Will not experience complications related to bowel motility 02/19/2021 1522 by Damaris Schooner, RN Outcome: Adequate for Discharge 02/19/2021 1041 by Damaris Schooner, RN Outcome: Progressing Goal: Will not experience complications related to urinary retention 02/19/2021 1522 by Damaris Schooner, RN Outcome: Adequate for Discharge 02/19/2021 1041 by Damaris Schooner, RN Outcome: Progressing   Problem: Pain Managment: Goal: General experience of comfort will improve 02/19/2021 1522 by Damaris Schooner, RN Outcome: Adequate for Discharge 02/19/2021 1041 by Damaris Schooner, RN Outcome: Progressing   Problem: Safety: Goal: Ability to remain free from injury will improve 02/19/2021 1522 by Damaris Schooner, RN Outcome: Adequate for Discharge 02/19/2021 1041 by Damaris Schooner, RN Outcome: Progressing   Problem: Skin Integrity: Goal: Risk for impaired skin integrity will decrease 02/19/2021 1522 by Damaris Schooner, RN Outcome: Adequate for Discharge 02/19/2021 1041 by Damaris Schooner, RN Outcome: Progressing

## 2021-02-20 ENCOUNTER — Encounter (HOSPITAL_COMMUNITY): Payer: Self-pay | Admitting: Neurosurgery

## 2021-02-21 ENCOUNTER — Encounter: Payer: Self-pay | Admitting: Family Medicine

## 2021-02-21 ENCOUNTER — Telehealth: Payer: Self-pay | Admitting: Physician Assistant

## 2021-02-21 NOTE — Telephone Encounter (Signed)
Scheduled appt per 1/6 referral. Spoke to pt who is aware of appt date and time. She is aware to arrive 15 mins prior to appt time.

## 2021-02-27 ENCOUNTER — Other Ambulatory Visit (HOSPITAL_COMMUNITY): Payer: Self-pay

## 2021-03-06 ENCOUNTER — Encounter: Payer: Self-pay | Admitting: Cardiology

## 2021-03-08 ENCOUNTER — Inpatient Hospital Stay: Payer: No Typology Code available for payment source

## 2021-03-08 ENCOUNTER — Encounter: Payer: Self-pay | Admitting: Physician Assistant

## 2021-03-08 ENCOUNTER — Other Ambulatory Visit: Payer: Self-pay

## 2021-03-08 ENCOUNTER — Inpatient Hospital Stay: Payer: No Typology Code available for payment source | Attending: Physician Assistant | Admitting: Physician Assistant

## 2021-03-08 VITALS — BP 115/79 | HR 88 | Temp 97.0°F | Resp 17 | Wt 145.1 lb

## 2021-03-08 DIAGNOSIS — Z8249 Family history of ischemic heart disease and other diseases of the circulatory system: Secondary | ICD-10-CM | POA: Insufficient documentation

## 2021-03-08 DIAGNOSIS — Z7289 Other problems related to lifestyle: Secondary | ICD-10-CM | POA: Insufficient documentation

## 2021-03-08 DIAGNOSIS — I10 Essential (primary) hypertension: Secondary | ICD-10-CM | POA: Insufficient documentation

## 2021-03-08 DIAGNOSIS — Z79899 Other long term (current) drug therapy: Secondary | ICD-10-CM | POA: Insufficient documentation

## 2021-03-08 DIAGNOSIS — I513 Intracardiac thrombosis, not elsewhere classified: Secondary | ICD-10-CM

## 2021-03-08 DIAGNOSIS — Z7901 Long term (current) use of anticoagulants: Secondary | ICD-10-CM | POA: Insufficient documentation

## 2021-03-08 DIAGNOSIS — Z833 Family history of diabetes mellitus: Secondary | ICD-10-CM | POA: Insufficient documentation

## 2021-03-08 LAB — C-REACTIVE PROTEIN: CRP: 0.7 mg/dL (ref <1.0)

## 2021-03-08 LAB — SEDIMENTATION RATE: Sed Rate: 43 mm/hr — ABNORMAL HIGH (ref 0–22)

## 2021-03-08 LAB — ANTITHROMBIN III: AntiThromb III Func: 115 % (ref 75–120)
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
## 2021-03-09 LAB — PROTEIN C ACTIVITY: Protein C Activity: 130 % (ref 73–180)

## 2021-03-09 LAB — PROTEIN S, TOTAL: Protein S Ag, Total: 99 % (ref 60–150)

## 2021-03-09 LAB — LUPUS ANTICOAGULANT PANEL
DRVVT: 38.7 s (ref 0.0–47.0)
PTT Lupus Anticoagulant: 33.6 s (ref 0.0–51.9)

## 2021-03-09 LAB — PROTEIN S ACTIVITY: Protein S Activity: 120 % (ref 63–140)

## 2021-03-10 LAB — PROTEIN C, TOTAL: Protein C, Total: 105 % (ref 60–150)

## 2021-03-10 LAB — BETA-2-GLYCOPROTEIN I ABS, IGG/M/A
Beta-2 Glyco I IgG: <9 GPI IgG units (ref 0–20)
Beta-2-Glycoprotein I IgA: <9 GPI IgA units (ref 0–25)
Beta-2-Glycoprotein I IgM: <9 GPI IgM units (ref 0–32)

## 2021-03-12 LAB — CARDIOLIPIN ANTIBODIES, IGG, IGM, IGA
Anticardiolipin IgA: <9 APL U/mL (ref 0–11)
Anticardiolipin IgG: <9 GPL U/mL (ref 0–14)
Anticardiolipin IgM: 19 MPL U/mL — ABNORMAL HIGH (ref 0–12)

## 2021-03-16 ENCOUNTER — Telehealth: Payer: Self-pay | Admitting: Physician Assistant

## 2021-03-16 NOTE — Telephone Encounter (Signed)
I called Karen Robertson and reviewed the lab results from 03/08/2021. Findings showed no evidence of a clotting disorder. There was mild elevation of sedimentation rate to suggest possible inflammatory process. Our recommendation is indefinite anticoagulation and continue on Eliquis 5 mg twice daily. We will see the patient back in 6 months to determine if she can transition to maintenance dose of Eliquis. Patient expressed understanding and satisfaction with the plan provided.

## 2021-03-16 NOTE — Telephone Encounter (Signed)
Scheduled per 2/9 secure chat, attempted to call but no voicemail, will mail calender
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
## 2021-03-21 ENCOUNTER — Ambulatory Visit (INDEPENDENT_AMBULATORY_CARE_PROVIDER_SITE_OTHER): Payer: No Typology Code available for payment source | Admitting: Cardiology

## 2021-03-21 ENCOUNTER — Other Ambulatory Visit: Payer: Self-pay

## 2021-03-21 ENCOUNTER — Encounter: Payer: Self-pay | Admitting: Cardiology

## 2021-03-21 VITALS — BP 130/62 | HR 98 | Ht 66.0 in | Wt 144.6 lb

## 2021-03-21 DIAGNOSIS — I10 Essential (primary) hypertension: Secondary | ICD-10-CM

## 2021-03-21 DIAGNOSIS — E785 Hyperlipidemia, unspecified: Secondary | ICD-10-CM | POA: Diagnosis not present

## 2021-03-21 DIAGNOSIS — I513 Intracardiac thrombosis, not elsewhere classified: Secondary | ICD-10-CM

## 2021-03-21 NOTE — Patient Instructions (Signed)
Medication Instructions:  Your physician recommends that you continue on your current medications as directed. Please refer to the Current Medication list given to you today.  *If you need a refill on your cardiac medications before your next appointment, please call your pharmacy*   Lab Work: None If you have labs (blood work) drawn today and your tests are completely normal, you will receive your results only by: [place] (if you have MyChart) OR A paper copy in the mail If you have any lab test that is abnormal or we need to change your treatment, we will call you to review the results.   Testing/Procedures: None   Follow-Up: At [facility], you and your health needs are our priority.  As part of our continuing mission to provide you with exceptional heart care, we have created designated Provider Care Teams.  These Care Teams include your primary Cardiologist (physician) and Advanced Practice Providers (APPs -  Physician Assistants and Nurse Practitioners) who all work together to provide you with the care you need, when you need it.  We recommend signing up for the patient portal called "MyChart".  Sign up information is provided on this After Visit Summary.  MyChart is used to connect with patients for Virtual Visits (Telemedicine).  Patients are able to view lab/test results, encounter notes, upcoming appointments, etc.  Non-urgent messages can be sent to your provider as well.   To learn more about what you can do with MyChart, go to [email or web address].    Your next appointment:   6 month(s)  The format for your next appointment:   In Person  Provider:   Donato Heinz, MD     Other Instructions

## 2021-04-04 ENCOUNTER — Encounter: Payer: Self-pay | Admitting: Family Medicine

## 2021-04-07 ENCOUNTER — Telehealth: Payer: Self-pay | Admitting: Pharmacist

## 2021-04-07 NOTE — Telephone Encounter (Signed)
Patient has been notified samples are ready for pick up.  ?

## 2021-04-07 NOTE — Telephone Encounter (Signed)
Patient presents to [facility] requesting samples of Eliquis. ? ?Patient informed the front she was going out of town on Monday and has not heard from medication assistance.  ? ?Attempted to call patient get more information. However, no answer to option for VM.  ? ?Will forward to Pharmacy.  ?

## 2021-04-07 NOTE — Telephone Encounter (Signed)
Patient Contacted by Glori Bickers, CMA ?Medication Samples have been provided to the patient. ? ?Drug name: Eliquis (apixaban)       Strength: 5mg          Qty: 56  LOT: [id number]  Exp.Date: 10/06/2022 ? ?Dosing instructions: 1 BID ? ?The patient has been instructed regarding the correct time, dose, and frequency of taking this medication, including desired effects and most common side effects.  ? ?Karen Robertson ?4:03 PM ?04/07/2021 ? ?

## 2021-04-24 NOTE — Progress Notes (Signed)
Received notification from [place] ([place]) regarding approval for [employer]. Patient assistance approved from 04/21/21 to 04/21/22. ? ?Phone: [phone] ? ?

## 2021-07-11 ENCOUNTER — Encounter: Payer: Self-pay | Admitting: *Deleted

## 2021-09-13 ENCOUNTER — Inpatient Hospital Stay: Payer: Medicaid Other | Admitting: Physician Assistant

## 2021-09-13 ENCOUNTER — Inpatient Hospital Stay: Payer: Medicaid Other | Attending: Physician Assistant

## 2021-09-13 ENCOUNTER — Other Ambulatory Visit: Payer: Self-pay | Admitting: Physician Assistant

## 2021-09-13 DIAGNOSIS — I513 Intracardiac thrombosis, not elsewhere classified: Secondary | ICD-10-CM

## 2023-07-16 ENCOUNTER — Encounter: Payer: Self-pay | Admitting: *Deleted

## 2023-08-16 ENCOUNTER — Ambulatory Visit: Admitting: Student
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
## 2023-08-16 NOTE — Patient Instructions (Incomplete)
 It was great to see you! Thank you for allowing me to participate in your care!  I recommend that you always bring your medications to each appointment as this makes it easy to ensure we are on the correct medications and helps us  not miss when refills are needed.  Our plans for today:  - Check up  Checking cholesterol, a1c  - Health Maintenance You are due for a: Mammogram  Shingles Vaccine Covid 19 Vaccine   We are checking some labs today, I will call you if they are abnormal will send you a MyChart message or a letter if they are normal.  If you do not hear about your labs in the next 2 weeks please let us  know.***  Take care and seek immediate care sooner if you develop any concerns.   Dr. Penne Rhein, MD [facility]
